# Patient Record
Sex: Male | Born: 1945 | Race: White | Hispanic: No | Marital: Married | Smoking: Former smoker
Health system: Southern US, Community
[De-identification: ages and names within clinical notes are randomized; demographics above are authoritative.]

## PROBLEM LIST (undated history)

## (undated) DIAGNOSIS — E119 Type 2 diabetes mellitus without complications: Secondary | ICD-10-CM

## (undated) DIAGNOSIS — E7800 Pure hypercholesterolemia, unspecified: Secondary | ICD-10-CM

## (undated) DIAGNOSIS — E785 Hyperlipidemia, unspecified: Secondary | ICD-10-CM

## (undated) DIAGNOSIS — I10 Essential (primary) hypertension: Secondary | ICD-10-CM

## (undated) DIAGNOSIS — H9190 Unspecified hearing loss, unspecified ear: Secondary | ICD-10-CM

## (undated) HISTORY — DX: Hyperlipidemia, unspecified: E78.5

## (undated) HISTORY — DX: Type 2 diabetes mellitus without complications: E11.9

## (undated) HISTORY — PX: CATARACT EXTRACTION: SUR2

## (undated) HISTORY — PX: FOOT SURGERY: SHX648

---

## 2013-01-16 ENCOUNTER — Other Ambulatory Visit: Payer: Self-pay | Admitting: Physician Assistant

## 2013-01-17 NOTE — Telephone Encounter (Signed)
CHART SHOWS PT NOT TAKING BUT PATIENT HAD FILLED LAST MONTH. LAST OV 1/14. LAST AIC 12/13 11.7

## 2013-03-14 ENCOUNTER — Ambulatory Visit (INDEPENDENT_AMBULATORY_CARE_PROVIDER_SITE_OTHER): Payer: Medicare HMO | Admitting: Family Medicine

## 2013-03-14 ENCOUNTER — Encounter: Payer: Self-pay | Admitting: Family Medicine

## 2013-03-14 VITALS — BP 156/87 | HR 68 | Temp 97.3°F | Ht 67.5 in | Wt 178.6 lb

## 2013-03-14 DIAGNOSIS — I10 Essential (primary) hypertension: Secondary | ICD-10-CM

## 2013-03-14 DIAGNOSIS — K4090 Unilateral inguinal hernia, without obstruction or gangrene, not specified as recurrent: Secondary | ICD-10-CM

## 2013-03-14 DIAGNOSIS — Z0000 Encounter for general adult medical examination without abnormal findings: Secondary | ICD-10-CM

## 2013-03-14 DIAGNOSIS — E785 Hyperlipidemia, unspecified: Secondary | ICD-10-CM

## 2013-03-14 DIAGNOSIS — E119 Type 2 diabetes mellitus without complications: Secondary | ICD-10-CM

## 2013-03-14 DIAGNOSIS — E559 Vitamin D deficiency, unspecified: Secondary | ICD-10-CM

## 2013-03-14 LAB — COMPLETE METABOLIC PANEL WITH GFR
ALT: 26 U/L (ref 0–53)
AST: 19 U/L (ref 0–37)
Albumin: 4.5 g/dL (ref 3.5–5.2)
Alkaline Phosphatase: 54 U/L (ref 39–117)
BUN: 21 mg/dL (ref 6–23)
CO2: 27 mEq/L (ref 19–32)
Calcium: 10.3 mg/dL (ref 8.4–10.5)
Chloride: 101 mEq/L (ref 96–112)
Creat: 0.87 mg/dL (ref 0.50–1.35)
GFR, Est African American: >89 mL/min
GFR, Est Non African American: 89 mL/min
Glucose, Bld: 129 mg/dL — ABNORMAL HIGH (ref 70–99)
Potassium: 4.6 mEq/L (ref 3.5–5.3)
Sodium: 139 mEq/L (ref 135–145)
Total Bilirubin: 0.5 mg/dL (ref 0.3–1.2)
Total Protein: 7 g/dL (ref 6.0–8.3)

## 2013-03-14 LAB — POCT CBC
Granulocyte percent: 67.2 %G (ref 37–80)
HCT, POC: 43.5 % (ref 43.5–53.7)
Hemoglobin: 15.5 g/dL (ref 14.1–18.1)
Lymph, poc: 1.9 (ref 0.6–3.4)
MCH, POC: 30.2 pg (ref 27–31.2)
MCHC: 35.6 g/dL — AB (ref 31.8–35.4)
MCV: 84.9 fL (ref 80–97)
MPV: 9.9 fL (ref 0–99.8)
POC Granulocyte: 4.5 (ref 2–6.9)
POC LYMPH PERCENT: 28 %L (ref 10–50)
Platelet Count, POC: 179 10*3/uL (ref 142–424)
RBC: 5.1 M/uL (ref 4.69–6.13)
RDW, POC: 13.3 %
WBC: 6.7 10*3/uL (ref 4.6–10.2)

## 2013-03-14 LAB — POCT GLYCOSYLATED HEMOGLOBIN (HGB A1C): Hemoglobin A1C: 8.2

## 2013-03-14 LAB — PSA: PSA: 2.04 ng/mL (ref <=4.00)

## 2013-03-14 MED ORDER — PRAVASTATIN SODIUM 40 MG PO TABS: 40.0000 mg | ORAL_TABLET | Freq: Every day | ORAL | Status: DC

## 2013-03-14 MED ORDER — LISINOPRIL 10 MG PO TABS: 10.0000 mg | ORAL_TABLET | Freq: Every day | ORAL | Status: DC

## 2013-03-14 MED ORDER — METFORMIN HCL 1000 MG PO TABS: 1000.0000 mg | ORAL_TABLET | Freq: Two times a day (BID) | ORAL | Status: DC

## 2013-03-14 MED ORDER — GLIMEPIRIDE 4 MG PO TABS: 4.0000 mg | ORAL_TABLET | Freq: Every day | ORAL | Status: DC
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
## 2013-03-14 NOTE — Patient Instructions (Signed)

## 2013-03-15 ENCOUNTER — Other Ambulatory Visit: Payer: Self-pay | Admitting: Family Medicine

## 2013-03-15 LAB — NMR LIPOPROFILE WITH LIPIDS
Cholesterol, Total: 198 mg/dL (ref <200)
HDL Particle Number: 36 umol/L (ref >=30.5)
HDL Size: 8.6 nm — ABNORMAL LOW (ref >=9.2)
HDL-C: 45 mg/dL (ref >=40)
LDL (calc): 130 mg/dL — ABNORMAL HIGH (ref <100)
LDL Particle Number: 1812 nmol/L — ABNORMAL HIGH (ref <1000)
LDL Size: 20.3 nm — ABNORMAL LOW (ref >20.5)
LP-IR Score: 64 — ABNORMAL HIGH (ref <=45)
Large HDL-P: 3.9 umol/L — ABNORMAL LOW (ref >=4.8)
Large VLDL-P: 1.6 nmol/L (ref <=2.7)
Small LDL Particle Number: 1063 nmol/L — ABNORMAL HIGH (ref <=527)
Triglycerides: 113 mg/dL (ref <150)
VLDL Size: 46.2 nm (ref <=46.6)

## 2013-03-15 LAB — VITAMIN D 25 HYDROXY (VIT D DEFICIENCY, FRACTURES): Vit D, 25-Hydroxy: 50 ng/mL (ref 30–89)

## 2013-03-15 MED ORDER — PRAVASTATIN SODIUM 80 MG PO TABS: 80.0000 mg | ORAL_TABLET | Freq: Every day | ORAL | Status: DC

## 2013-06-15 ENCOUNTER — Encounter: Payer: Self-pay | Admitting: Family Medicine

## 2013-06-15 ENCOUNTER — Ambulatory Visit (INDEPENDENT_AMBULATORY_CARE_PROVIDER_SITE_OTHER): Payer: Medicare HMO | Admitting: Family Medicine

## 2013-06-15 VITALS — BP 127/69 | HR 69 | Temp 97.8°F | Ht 67.0 in | Wt 179.6 lb

## 2013-06-15 DIAGNOSIS — E119 Type 2 diabetes mellitus without complications: Secondary | ICD-10-CM

## 2013-06-15 DIAGNOSIS — E785 Hyperlipidemia, unspecified: Secondary | ICD-10-CM

## 2013-06-15 LAB — POCT CBC
Granulocyte percent: 64.6 %G (ref 37–80)
HCT, POC: 45.4 % (ref 43.5–53.7)
Hemoglobin: 15.2 g/dL (ref 14.1–18.1)
Lymph, poc: 2 (ref 0.6–3.4)
MCH, POC: 28.6 pg (ref 27–31.2)
MCHC: 33.5 g/dL (ref 31.8–35.4)
MCV: 85.4 fL (ref 80–97)
MPV: 8.5 fL (ref 0–99.8)
POC Granulocyte: 4.2 (ref 2–6.9)
POC LYMPH PERCENT: 30.8 %L (ref 10–50)
Platelet Count, POC: 196 10*3/uL (ref 142–424)
RBC: 5.3 M/uL (ref 4.69–6.13)
RDW, POC: 12.9 %
WBC: 6.5 10*3/uL (ref 4.6–10.2)

## 2013-06-15 LAB — POCT GLYCOSYLATED HEMOGLOBIN (HGB A1C): Hemoglobin A1C: 7.7

## 2013-06-15 MED ORDER — GLIMEPIRIDE 4 MG PO TABS: 4.0000 mg | ORAL_TABLET | Freq: Every day | ORAL | Status: DC

## 2013-06-15 MED ORDER — METFORMIN HCL 1000 MG PO TABS: 1000.0000 mg | ORAL_TABLET | Freq: Two times a day (BID) | ORAL | Status: DC

## 2013-06-15 MED ORDER — PRAVASTATIN SODIUM 80 MG PO TABS: 80.0000 mg | ORAL_TABLET | Freq: Every day | ORAL | Status: DC

## 2013-06-15 NOTE — Patient Instructions (Signed)

## 2013-06-15 NOTE — Progress Notes (Signed)
  Subjective:    Patient ID: George [name], male    DOB: 1946/04/13, 67 y.o.   MRN: [id number]  HPI  This 67 y.o. male presents for evaluation of follow up appointment.   He has hx Of DM, hyperlipidemia, and hypertension.  He has not had colonoscopy.  He has not had shingles Vaccination and is due for Tdap.  He has no acute problems and is due for annual labs.  Review of Systems No chest pain, SOB, HA, dizziness, vision change, N/V, diarrhea, constipation, dysuria, urinary urgency or frequency, myalgias, arthralgias or rash.     Objective:   Physical Exam Vital signs noted  Well developed well nourished male.  HEENT - Head atraumatic Normocephalic                Eyes - PERRLA, Conjuctiva - clear Sclera- Clear EOMI                Ears - EAC's Wnl TM's Wnl Gross Hearing WNL                Nose - Nares patent                 Throat - oropharanx wnl Respiratory - Lungs CTA bilateral Cardiac - RRR S1 and S2 without murmur GI - Abdomen soft Nontender and bowel sounds active x 4 Extremities - No edema. Neuro - Grossly intact.       Assessment & Plan:  Diabetes - Plan: metFORMIN (GLUCOPHAGE) 1000 MG tablet, glimepiride (AMARYL) 4 MG tablet, Ambulatory referral to Gastroenterology, POCT CBC, POCT glycosylated hemoglobin (Hb A1C), CMP14+EGFR  Other and unspecified hyperlipidemia - Plan: pravastatin (PRAVACHOL) 80 MG tablet, Ambulatory referral to Gastroenterology, POCT CBC, Lipid panel, CMP14+EGFR

## 2013-06-16 LAB — LIPID PANEL
Chol/HDL Ratio: 3.8 ratio units (ref 0.0–5.0)
Cholesterol, Total: 184 mg/dL (ref 100–199)
HDL: 49 mg/dL (ref >39)
LDL Calculated: 116 mg/dL — ABNORMAL HIGH (ref 0–99)
Triglycerides: 93 mg/dL (ref 0–149)
VLDL Cholesterol Cal: 19 mg/dL (ref 5–40)

## 2013-06-16 LAB — CMP14+EGFR
ALT: 25 IU/L (ref 0–44)
AST: 18 IU/L (ref 0–40)
Albumin/Globulin Ratio: 2.5 (ref 1.1–2.5)
Albumin: 4.5 g/dL (ref 3.6–4.8)
Alkaline Phosphatase: 52 IU/L (ref 39–117)
BUN/Creatinine Ratio: 24 — ABNORMAL HIGH (ref 10–22)
BUN: 19 mg/dL (ref 8–27)
CO2: 26 mmol/L (ref 18–29)
Calcium: 9.7 mg/dL (ref 8.6–10.2)
Chloride: 95 mmol/L — ABNORMAL LOW (ref 97–108)
Creatinine, Ser: 0.8 mg/dL (ref 0.76–1.27)
GFR calc Af Amer: 106 mL/min/{1.73_m2} (ref >59)
GFR calc non Af Amer: 92 mL/min/{1.73_m2} (ref >59)
Globulin, Total: 1.8 g/dL (ref 1.5–4.5)
Glucose: 137 mg/dL — ABNORMAL HIGH (ref 65–99)
Potassium: 4.5 mmol/L (ref 3.5–5.2)
Sodium: 136 mmol/L (ref 134–144)
Total Bilirubin: 0.2 mg/dL (ref 0.0–1.2)
Total Protein: 6.3 g/dL (ref 6.0–8.5)

## 2013-06-25 ENCOUNTER — Encounter: Payer: Self-pay | Admitting: Internal Medicine

## 2013-07-02 ENCOUNTER — Encounter (HOSPITAL_COMMUNITY): Payer: Self-pay | Admitting: Pharmacy Technician

## 2013-07-05 NOTE — Patient Instructions (Addendum)
Your procedure is scheduled on:07/13/2013   Report to [facility] at   6:15  AM.  Call this number if you have problems the morning of surgery: [phone]   Remember:   Do not drink or eat food:After Midnight.  :  Take these medicines the morning of surgery with A SIP OF WATER: lisinopril   Do not wear jewelry, make-up or nail polish.  Do not wear lotions, powders, or perfumes. You may wear deodorant.  Do not shave 48 hours prior to surgery. Men may shave face and neck.  Do not bring valuables to the hospital.  Contacts, dentures or bridgework may not be worn into surgery.  Leave suitcase in the car. After surgery it may be brought to your room.  For patients admitted to the hospital, checkout time is 11:00 AM the day of discharge.   Patients discharged the day of surgery will not be allowed to drive home.    Special Instructions: Shower using CHG 2 nights before surgery and the night before surgery.  If you shower the day of surgery use CHG.  Use special wash - you have one bottle of CHG for all showers.  You should use approximately 1/3 of the bottle for each shower.   Please read over the following fact sheets that you were given: Pain Booklet, MRSA Information, Surgical Site Infection Prevention and Care and Recovery After Surgery  Hernia Repair Care After These instructions give you information on caring for yourself after your procedure. Your doctor may also give you more specific instructions. Call your doctor if you have any problems or questions after your procedure. HOME CARE   You may have changes in your poops (bowel movements).  You may have loose or watery poop (diarrhea).  You may be not able to poop.  Your bowels will slowly get back to normal.  Do not eat any food that makes you sick to your stomach (nauseous). Eat small meals 4 to 6 times a day instead of 3 large ones.  Do not drink pop. It will give you gas.  Do not drink alcohol.  Do not lift anything  heavier than 10 pounds. This is about the weight of a gallon of milk.  Do not do anything that makes you very tired for at least 6 weeks.  Do not get your wound wet for 2 days.  You may take a sponge bath during this time.  After 2 days you may take a shower. Gently pat your surgical cut (incision) dry with a towel. Do not rub it.  For men: You may have been given an athletic supporter (scrotal support) before you left the hospital. It holds your scrotum and testicles closer to your body so there is no strain on your wound. Wear the supporter until your doctor tells you that you do not need it anymore. GET HELP RIGHT AWAY IF:  You have watery poop, or cannot poop for more than 3 days.  You feel sick to your stomach or throw up (vomit) more than 2 or 3 times.  You have temperature by mouth above 102 F (38.9 C).  You see redness or puffiness (swelling) around your wound.  You see yellowish white fluid (pus) coming from your wound.  You see a bulge or bump in your lower belly (abdomen) or near your groin.  You develop a rash, trouble breathing, or any other symptoms from medicines taken. MAKE SURE YOU:  Understand these instructions.  Will watch your condition.  Will get help right away if your are not doing well or get worse. Document Released: 09/02/2008 Document Revised: 12/13/2011 Document Reviewed: 09/02/2008 [facility] Patient Information 2014 [place]. PATIENT INSTRUCTIONS POST-ANESTHESIA  IMMEDIATELY FOLLOWING SURGERY:  Do not drive or operate machinery for the first twenty four hours after surgery.  Do not make any important decisions for twenty four hours after surgery or while taking narcotic pain medications or sedatives.  If you develop intractable nausea and vomiting or a severe headache please notify your doctor immediately.  FOLLOW-UP:  Please make an appointment with your surgeon as instructed. You do not need to follow up with anesthesia unless  specifically instructed to do so.  WOUND CARE INSTRUCTIONS (if applicable):  Keep a dry clean dressing on the anesthesia/puncture wound site if there is drainage.  Once the wound has quit draining you may leave it open to air.  Generally you should leave the bandage intact for twenty four hours unless there is drainage.  If the epidural site drains for more than 36-48 hours please call the anesthesia department.  QUESTIONS?:  Please feel free to call your physician or the hospital operator if you have any questions, and they will be happy to assist you.

## 2013-07-06 ENCOUNTER — Encounter (HOSPITAL_COMMUNITY): Payer: Self-pay

## 2013-07-06 ENCOUNTER — Encounter (HOSPITAL_COMMUNITY)
Admission: RE | Admit: 2013-07-06 | Discharge: 2013-07-06 | Disposition: A | Discharge status: Home / Self Care | Payer: Medicare HMO | Source: Ambulatory Visit | Attending: General Surgery | Admitting: General Surgery

## 2013-07-06 DIAGNOSIS — Z01818 Encounter for other preprocedural examination: Secondary | ICD-10-CM | POA: Insufficient documentation

## 2013-07-06 DIAGNOSIS — Z01812 Encounter for preprocedural laboratory examination: Secondary | ICD-10-CM | POA: Insufficient documentation

## 2013-07-06 DIAGNOSIS — Z01810 Encounter for preprocedural cardiovascular examination: Secondary | ICD-10-CM | POA: Insufficient documentation

## 2013-07-06 HISTORY — DX: Essential (primary) hypertension: I10

## 2013-07-06 HISTORY — DX: Pure hypercholesterolemia, unspecified: E78.00

## 2013-07-06 HISTORY — DX: Unspecified hearing loss, unspecified ear: H91.90

## 2013-07-06 LAB — CBC WITH DIFFERENTIAL/PLATELET
Basophils Relative: 0 % (ref 0–1)
HCT: 42.1 % (ref 39.0–52.0)
Hemoglobin: 14.4 g/dL (ref 13.0–17.0)
Lymphocytes Relative: 31 % (ref 12–46)
Lymphs Abs: 2.2 10*3/uL (ref 0.7–4.0)
Monocytes Absolute: 0.3 10*3/uL (ref 0.1–1.0)
Monocytes Relative: 5 % (ref 3–12)
Neutro Abs: 4.5 10*3/uL (ref 1.7–7.7)
Neutrophils Relative %: 63 % (ref 43–77)
RBC: 4.93 MIL/uL (ref 4.22–5.81)
WBC: 7.2 10*3/uL (ref 4.0–10.5)

## 2013-07-06 LAB — BASIC METABOLIC PANEL
BUN: 19 mg/dL (ref 6–23)
CO2: 26 mEq/L (ref 19–32)
Calcium: 9.9 mg/dL (ref 8.4–10.5)
Chloride: 95 mEq/L — ABNORMAL LOW (ref 96–112)
Creatinine, Ser: 0.8 mg/dL (ref 0.50–1.35)
GFR calc Af Amer: >90 mL/min (ref >90)
GFR calc non Af Amer: 90 mL/min — ABNORMAL LOW (ref >90)
Glucose, Bld: 227 mg/dL — ABNORMAL HIGH (ref 70–99)
Potassium: 4.6 mEq/L (ref 3.5–5.1)
Sodium: 135 mEq/L (ref 135–145)

## 2013-07-06 NOTE — Progress Notes (Signed)
07/06/13 0933  OBSTRUCTIVE SLEEP APNEA  Have you ever been diagnosed with sleep apnea through a sleep study? No  Do you snore loudly (loud enough to be heard through closed doors)?  1  Do you often feel tired, fatigued, or sleepy during the daytime? 0  Has anyone observed you stop breathing during your sleep? 0  Do you have, or are you being treated for high blood pressure? 1  BMI more than 35 kg/m2? 0  Age over 68 years old? 1  Neck circumference greater than 40 cm/18 inches? 0  Gender: 1  Obstructive Sleep Apnea Score 4

## 2013-07-12 NOTE — OR Nursing (Signed)
Trenton Gammon called Dr. Leticia Penna for orders.

## 2013-07-13 ENCOUNTER — Ambulatory Visit (HOSPITAL_COMMUNITY)
Admission: RE | Admit: 2013-07-13 | Discharge: 2013-07-13 | Disposition: A | Discharge status: Home / Self Care | Payer: Medicare HMO | Source: Ambulatory Visit | Attending: General Surgery | Admitting: General Surgery

## 2013-07-13 ENCOUNTER — Encounter (HOSPITAL_COMMUNITY)
Admission: RE | Disposition: A | Discharge status: Home / Self Care | Payer: Self-pay | Source: Ambulatory Visit | Attending: General Surgery

## 2013-07-13 ENCOUNTER — Ambulatory Visit (HOSPITAL_COMMUNITY): Payer: Medicare HMO | Admitting: Anesthesiology

## 2013-07-13 ENCOUNTER — Encounter (HOSPITAL_COMMUNITY): Payer: Self-pay | Admitting: *Deleted

## 2013-07-13 ENCOUNTER — Encounter (HOSPITAL_COMMUNITY): Payer: Medicare HMO | Admitting: Anesthesiology

## 2013-07-13 DIAGNOSIS — K4090 Unilateral inguinal hernia, without obstruction or gangrene, not specified as recurrent: Secondary | ICD-10-CM | POA: Insufficient documentation

## 2013-07-13 DIAGNOSIS — E119 Type 2 diabetes mellitus without complications: Secondary | ICD-10-CM | POA: Insufficient documentation

## 2013-07-13 DIAGNOSIS — I10 Essential (primary) hypertension: Secondary | ICD-10-CM | POA: Insufficient documentation

## 2013-07-13 DIAGNOSIS — Z01812 Encounter for preprocedural laboratory examination: Secondary | ICD-10-CM | POA: Insufficient documentation

## 2013-07-13 HISTORY — PX: INGUINAL HERNIA REPAIR: SHX194

## 2013-07-13 LAB — GLUCOSE, CAPILLARY: Glucose-Capillary: 208 mg/dL — ABNORMAL HIGH (ref 70–99)

## 2013-07-13 SURGERY — REPAIR, HERNIA, INGUINAL, ADULT
Anesthesia: General | Site: Groin | Laterality: Left | Wound class: Clean

## 2013-07-13 MED ORDER — BUPIVACAINE HCL (PF) 0.5 % IJ SOLN
INTRAMUSCULAR | Status: AC
  Filled 2013-07-13: qty 30

## 2013-07-13 MED ORDER — FENTANYL CITRATE 0.05 MG/ML IJ SOLN
INTRAMUSCULAR | Status: AC
  Filled 2013-07-13: qty 5

## 2013-07-13 MED ORDER — CEFAZOLIN SODIUM-DEXTROSE 2-3 GM-% IV SOLR
INTRAVENOUS | Status: AC
  Filled 2013-07-13: qty 50

## 2013-07-13 MED ORDER — FENTANYL CITRATE 0.05 MG/ML IJ SOLN
INTRAMUSCULAR | Status: DC | PRN
  Administered 2013-07-13 (×2): 50 ug via INTRAVENOUS
  Administered 2013-07-13: 150 ug via INTRAVENOUS

## 2013-07-13 MED ORDER — LACTATED RINGERS IV SOLN
INTRAVENOUS | Status: DC
  Administered 2013-07-13 (×2): via INTRAVENOUS

## 2013-07-13 MED ORDER — LIDOCAINE HCL (PF) 1 % IJ SOLN
INTRAMUSCULAR | Status: AC
  Filled 2013-07-13: qty 5

## 2013-07-13 MED ORDER — FENTANYL CITRATE 0.05 MG/ML IJ SOLN
INTRAMUSCULAR | Status: AC
  Filled 2013-07-13: qty 2

## 2013-07-13 MED ORDER — ONDANSETRON HCL 4 MG/2ML IJ SOLN: 4.0000 mg | Freq: Once | INTRAMUSCULAR | Status: DC | PRN

## 2013-07-13 MED ORDER — BUPIVACAINE HCL (PF) 0.5 % IJ SOLN
INTRAMUSCULAR | Status: DC | PRN
  Administered 2013-07-13: 10 mL

## 2013-07-13 MED ORDER — MIDAZOLAM HCL 2 MG/2ML IJ SOLN
1.0000 mg | INTRAMUSCULAR | Status: DC | PRN
  Administered 2013-07-13: 2 mg via INTRAVENOUS

## 2013-07-13 MED ORDER — PROPOFOL 10 MG/ML IV BOLUS
INTRAVENOUS | Status: AC
  Filled 2013-07-13: qty 20

## 2013-07-13 MED ORDER — EPHEDRINE SULFATE 50 MG/ML IJ SOLN
INTRAMUSCULAR | Status: AC
  Filled 2013-07-13: qty 1

## 2013-07-13 MED ORDER — FENTANYL CITRATE 0.05 MG/ML IJ SOLN
25.0000 ug | INTRAMUSCULAR | Status: DC | PRN
  Administered 2013-07-13 (×4): 50 ug via INTRAVENOUS

## 2013-07-13 MED ORDER — SODIUM CHLORIDE 0.9 % IR SOLN
Status: DC | PRN
  Administered 2013-07-13: 1000 mL

## 2013-07-13 MED ORDER — CEFAZOLIN SODIUM-DEXTROSE 2-3 GM-% IV SOLR
2.0000 g | INTRAVENOUS | Status: AC
  Administered 2013-07-13: 2 g via INTRAVENOUS

## 2013-07-13 MED ORDER — EPHEDRINE SULFATE 50 MG/ML IJ SOLN
INTRAMUSCULAR | Status: DC | PRN
  Administered 2013-07-13 (×2): 10 mg via INTRAVENOUS

## 2013-07-13 MED ORDER — LIDOCAINE HCL 1 % IJ SOLN
INTRAMUSCULAR | Status: DC | PRN
  Administered 2013-07-13: 40 mg via INTRADERMAL

## 2013-07-13 MED ORDER — FENTANYL CITRATE 0.05 MG/ML IJ SOLN
25.0000 ug | INTRAMUSCULAR | Status: AC
  Administered 2013-07-13 (×2): 25 ug via INTRAVENOUS

## 2013-07-13 MED ORDER — MIDAZOLAM HCL 2 MG/2ML IJ SOLN
INTRAMUSCULAR | Status: AC
  Filled 2013-07-13: qty 2

## 2013-07-13 MED ORDER — CELECOXIB 100 MG PO CAPS
400.0000 mg | ORAL_CAPSULE | Freq: Every day | ORAL | Status: AC
  Administered 2013-07-13: 400 mg via ORAL
  Filled 2013-07-13: qty 4

## 2013-07-13 MED ORDER — PROPOFOL 10 MG/ML IV BOLUS
INTRAVENOUS | Status: DC | PRN
  Administered 2013-07-13 (×2): 100 mg via INTRAVENOUS
  Administered 2013-07-13: 180 mg via INTRAVENOUS

## 2013-07-13 MED ORDER — HYDROCODONE-ACETAMINOPHEN 5-325 MG PO TABS: 1.0000 | ORAL_TABLET | ORAL | Status: DC | PRN

## 2013-07-13 SURGICAL SUPPLY — 42 items
APL SKNCLS STERI-STRIP NONHPOA (GAUZE/BANDAGES/DRESSINGS) ×1
BAG HAMPER (MISCELLANEOUS) ×2 IMPLANT
BENZOIN TINCTURE PRP APPL 2/3 (GAUZE/BANDAGES/DRESSINGS) ×2 IMPLANT
CLOTH BEACON ORANGE TIMEOUT ST (SAFETY) ×2 IMPLANT
COVER LIGHT HANDLE STERIS (MISCELLANEOUS) ×4 IMPLANT
DECANTER SPIKE VIAL GLASS SM (MISCELLANEOUS) ×2 IMPLANT
DRAIN PENROSE 18X.75 LTX STRL (MISCELLANEOUS) ×2 IMPLANT
DURAPREP 26ML APPLICATOR (WOUND CARE) ×2 IMPLANT
ELECT REM PT RETURN 9FT ADLT (ELECTROSURGICAL) ×2
ELECTRODE REM PT RTRN 9FT ADLT (ELECTROSURGICAL) ×1 IMPLANT
FORMALIN 10 PREFIL 120ML (MISCELLANEOUS) ×2 IMPLANT
GLOVE BIO SURGEON STRL SZ7 (GLOVE) ×1 IMPLANT
GLOVE BIOGEL PI IND STRL 7.0 (GLOVE) IMPLANT
GLOVE BIOGEL PI IND STRL 7.5 (GLOVE) ×1 IMPLANT
GLOVE BIOGEL PI INDICATOR 7.0 (GLOVE) ×1
GLOVE BIOGEL PI INDICATOR 7.5 (GLOVE) ×1
GLOVE ECLIPSE 6.5 STRL STRAW (GLOVE) ×1 IMPLANT
GLOVE ECLIPSE 7.0 STRL STRAW (GLOVE) ×2 IMPLANT
GLOVE SS BIOGEL STRL SZ 6.5 (GLOVE) IMPLANT
GLOVE SUPERSENSE BIOGEL SZ 6.5 (GLOVE) ×1
GOWN STRL REIN XL XLG (GOWN DISPOSABLE) ×6 IMPLANT
INST SET MINOR GENERAL (KITS) ×2 IMPLANT
KIT ROOM TURNOVER APOR (KITS) ×2 IMPLANT
MANIFOLD NEPTUNE II (INSTRUMENTS) ×2 IMPLANT
MESH HERNIA 1.6X1.9 PLUG LRG (Mesh General) IMPLANT
MESH HERNIA PLUG LRG (Mesh General) ×1 IMPLANT
NS IRRIG 1000ML POUR BTL (IV SOLUTION) ×2 IMPLANT
PACK MINOR (CUSTOM PROCEDURE TRAY) ×2 IMPLANT
PAD ARMBOARD 7.5X6 YLW CONV (MISCELLANEOUS) ×2 IMPLANT
SET BASIN LINEN APH (SET/KITS/TRAYS/PACK) ×2 IMPLANT
STRIP CLOSURE SKIN 1/2X4 (GAUZE/BANDAGES/DRESSINGS) ×2 IMPLANT
SUT ETHIBOND NAB MO 7 #0 18IN (SUTURE) ×3 IMPLANT
SUT MNCRL AB 4-0 PS2 18 (SUTURE) ×2 IMPLANT
SUT SILK 2 0 (SUTURE)
SUT SILK 2-0 18XBRD TIE 12 (SUTURE) IMPLANT
SUT VIC AB 2-0 CT1 27 (SUTURE) ×2
SUT VIC AB 2-0 CT1 TAPERPNT 27 (SUTURE) ×1 IMPLANT
SUT VIC AB 3-0 SH 27 (SUTURE) ×2
SUT VIC AB 3-0 SH 27X BRD (SUTURE) ×1 IMPLANT
SUT VICRYL AB 3 0 TIES (SUTURE) IMPLANT
SYR BULB IRRIGATION 50ML (SYRINGE) ×2 IMPLANT
SYR CONTROL 10ML LL (SYRINGE) ×2 IMPLANT

## 2013-07-13 NOTE — Interval H&P Note (Signed)
History and Physical Interval Note:  07/13/2013 7:54 AM  George [name]  has presented today for surgery, with the diagnosis of left inguinal hernia  The various methods of treatment have been discussed with the patient and family. After consideration of risks, benefits and other options for treatment, the patient has consented to  Procedure(s): HERNIA REPAIR INGUINAL ADULT (Left) as a surgical intervention .  The patient's history has been reviewed, patient examined, no change in status, stable for surgery.  I have reviewed the patient's chart and labs.  Questions were answered to the patient's satisfaction.     Bradely Rudin C

## 2013-07-13 NOTE — Anesthesia Preprocedure Evaluation (Signed)
Anesthesia Evaluation  Patient identified by MRN, date of birth, ID band Patient awake    Reviewed: Allergy & Precautions, H&P , NPO status , Patient's Chart, lab work & pertinent test results  Airway Mallampati: II TM Distance: >3 FB Neck ROM: Full    Dental  (+) Poor Dentition, Missing, Loose, Chipped and Dental Advisory Given   Pulmonary former smoker,  breath sounds clear to auscultation        Cardiovascular hypertension, Rhythm:Regular Rate:Normal     Neuro/Psych    GI/Hepatic   Endo/Other  diabetes, Poorly Controlled, Type 2, Oral Hypoglycemic Agents  Renal/GU      Musculoskeletal   Abdominal   Peds  Hematology   Anesthesia Other Findings   Reproductive/Obstetrics                           Anesthesia Physical Anesthesia Plan  ASA: III  Anesthesia Plan: General   Post-op Pain Management:    Induction: Intravenous  Airway Management Planned: LMA  Additional Equipment:   Intra-op Plan:   Post-operative Plan: Extubation in OR  Informed Consent: I have reviewed the patients History and Physical, chart, labs and discussed the procedure including the risks, benefits and alternatives for the proposed anesthesia with the patient or authorized representative who has indicated his/her understanding and acceptance.     Plan Discussed with:   Anesthesia Plan Comments:         Anesthesia Quick Evaluation

## 2013-07-13 NOTE — Anesthesia Postprocedure Evaluation (Signed)
  Anesthesia Post-op Note  Patient: George [name]  Procedure(s) Performed: Procedure(s): HERNIA REPAIR INGUINAL ADULT (Left)  Patient Location: PACU  Anesthesia Type:General  Level of Consciousness: awake and alert   Airway and Oxygen Therapy: Patient Spontanous Breathing and Patient connected to face mask oxygen  Post-op Pain: none  Post-op Assessment: Post-op Vital signs reviewed, Patient's Cardiovascular Status Stable, Respiratory Function Stable, Patent Airway and No signs of Nausea or vomiting  Post-op Vital Signs: Reviewed and stable  Complications: No apparent anesthesia complications

## 2013-07-13 NOTE — Progress Notes (Signed)
Paged Dr. Leticia Penna @[phone]  for consent orders.

## 2013-07-13 NOTE — H&P (Signed)
  NTS SOAP Note  Vital Signs:  Vitals as of: 04/10/2013: Systolic 132: Diastolic 77: Heart Rate 70: Temp 97.6F: Height 78ft 7in: Weight 179Lbs 0 Ounces: BMI 28.04  BMI : 28.04 kg/m2  Subjective: This 39 Years 96 Months old Male presents for of bulge in left groin.  Slow increase in size.  Occ discomfort.  No ssx of incarceration of strangulation.    Review of Symptoms:  Constitutional:unremarkable   Head:unremarkable    Eyes:unremarkable   Nose/Mouth/Throat:unremarkable Cardiovascular:  unremarkable   Respiratory:unremarkable   Gastrointestinal:  unremarkable  as per HPI Genitourinary:unremarkable     Musculoskeletal:unremarkable   Skin:unremarkable Breast:unremarkable   Hematolgic/Lymphatic:unremarkable     Allergic/Immunologic:unremarkable     Past Medical History:  Obtained     Past Medical History  Surgical History: Foot Medical Problems: DM, Hyperchole, HTN Allergies: NKDA Medications: Metformin, glimerpiride, pravachol, lisinopril, ASA   Social History:Obtained  Social History  Preferred Language: English Race:  White Ethnicity: Not Hispanic / Latino Age: 67 Years 3 Months Marital Status:  M Alcohol: none Recreational drug(s): none   Smoking Status: Never smoker reviewed on 04/10/2013 Functional Status reviewed on mm/dd/yyyy ------------------------------------------------ Bathing: Normal Cooking: Normal Dressing: Normal Driving: Normal Eating: Normal Managing Meds: Normal Oral Care: Normal Shopping: Normal Toileting: Normal Transferring: Normal Walking: Normal Cognitive Status reviewed on mm/dd/yyyy ------------------------------------------------ Attention: Normal Decision Making: Normal Language: Normal Memory: Normal Motor: Normal Perception: Normal Problem Solving: Normal Visual and Spatial: Normal   Family History:Obtained    Family Health History Mother  Father  Other Family  Member, Living; Healthy; noncontributory    Objective Information: General:  Well appearing, well nourished in no distress. Skin:     no rash or prominent lesions Head:Atraumatic; no masses; no abnormalities Eyes:  conjunctiva clear, EOM intact, PERRL Mouth:  Mucous membranes moist, no mucosal lesions. Neck:  Supple without lymphadenopathy.  Heart:  RRR, no murmur Lungs:    CTA bilaterally, no wheezes, rhonchi, rales.  Breathing unlabored. Abdomen:Soft, NT/ND, no HSM, no masses..  Small umbilicla hernia.  Large LIH.  Nontender.    Assessment:    Plan: LIH.  Options discussed with patient.  SSX of incarceration/strangulation discussed.  Will schedule at his convenience.    Patient Education:Alternative treatments to surgery were discussed with patient (and family).  Risks and benefits  of procedure were fully explained to the patient (and family) who gave informed consent. Patient/family questions were addressed.  Follow-up:Pending Surgery,PRN

## 2013-07-13 NOTE — Op Note (Signed)
Patient:  George [name]  DOB:  1946-09-28  MRN:  [id number]    Preop Diagnosis:  Left inguinal hernia  Postop Diagnosis:  Same, indirect and direct components  Procedure:  Left anal hernia repair with mesh  Surgeon:  Dr. Tilford Pillar.   Anes:  General via laryngeal mask airway, 0.5% Sensorcaine plain for local  Indications:  Patient is a 67 year old male presented to my office with a very large left inguinal hernia. This was chronically incarcerated. There was suspected bowel present. He has no symptoms of strangulation. Risks benefits and alternatives of a hernia repair were discussed. Risk including but not limited to risk of bleeding, infection, infection and mesh, ischemic orchiditis, paresthesias, testicular loss, intraoperative cardiac component ventral discussed with patient.  Procedure note:  Patient was taken to the operator is placed in supine position the or table time the general anesthetic is administered. Once patient was asleep a larger mask airway was placed. At this point his abdomen and left groin were prepped with DuraPrep solution and draped in standard fashion. Time out was performed. A skin incision was created with a 10 blade scalpel with additional dissection down to subcuticular tissue carried out using electrocautery and including the division of Scarpa's fascia. By encountering the external oblique fascia this was scored with a 15 blade scalpel and opened medially to the external inguinal ring with Metzenbaum scissors. At this point a large incarcerated hernia sac was identified. I attempted to create a window behind the cord structures and hernia sac over the pubic tubercle with digital dissection however due to the size of the hernia I do not feel this is possible. I opted at this 0.2 in her into the hernia sac sharply with Metzenbaum scissors. The sigmoid colon is identified. It is free from the hernia sac and then reduced back into the abdominal cavity after  inspecting the entire length noting it was clearly viable. Some omentum was also returned back into the abdominal cavity. A lap sponge was placed into the direct defect to reduce the abdominal contents. I then identified the cord structures. I created a window behind the course or fevers and an indirect component of the hernia. A Penrose drain was placed and utilized to elevate the cord structures up into the field. I carefully dissected the indirect component of the hernia off the cord structures. The indirect contents were reduced back into the abdominal cavity. The redundant sac was excised and disposed of. Hemostasis was good. Attention at this time was turned to placement of the mesh.  A mesh plug was inserted into the internal inguinal ring adjacent to the cord structures. This was secured to the wall the ring with 0 Ethibond sutures in simple or fashion. With these pexing sutures in place the mesh onlay was brought to the field. His pexed medially to the pubic tubercle, superiorly to the conjoined tendon, inferiorly to the shelving portion of the inguinal ligament. The keyhole defect was secured around the cord structures. The tails were placed under the external oblique fascia. At this point as quite pleased with the appearance of the repair. To note all sutures used in the repair were 0 Ethibond sutures. The surgical field was irrigated with sterile saline. Hemostasis was excellent. The cord structures were returned back into the canal and the Penrose drain was removed. The external oblique fascia was reapproximated using a 2-0 Vicryl in a running continuous fashion. Local anesthetic was instilled. A 3-0 Vicryl was then utilized reapproximate the Scarpa's fascial  layer. The skin edges were then reapproximated using a 4-0 Monocryl in a running subcuticular suture. Skin was washed dried moist dry towel. Benzoin is applied around incision. Half-inch are suture placed. The drapes removed the patient left come  general anesthetic. He was transferred to the PACU in stable condition. At the conclusion of procedure all instrument, sponge, needle counts are correct. Patient tolerated procedure extremely well.  Complications:  None apparent  EBL:  Less than 50 ML's  Specimen:  None

## 2013-07-13 NOTE — Preoperative (Signed)
Beta Blockers   Reason not to administer Beta Blockers:Not Applicable 

## 2013-07-13 NOTE — Transfer of Care (Signed)
Immediate Anesthesia Transfer of Care Note  Patient: George [name]  Procedure(s) Performed: Procedure(s): HERNIA REPAIR INGUINAL ADULT (Left)  Patient Location: PACU  Anesthesia Type:General  Level of Consciousness: awake  Airway & Oxygen Therapy: Patient Spontanous Breathing and Patient connected to face mask oxygen  Post-op Assessment: Report given to PACU RN  Post vital signs: Reviewed and stable  Complications: No apparent anesthesia complications

## 2013-07-17 ENCOUNTER — Encounter (HOSPITAL_COMMUNITY): Payer: Self-pay | Admitting: General Surgery

## 2013-08-20 ENCOUNTER — Ambulatory Visit (AMBULATORY_SURGERY_CENTER): Payer: Self-pay

## 2013-08-20 VITALS — Ht 67.0 in | Wt 184.0 lb

## 2013-08-20 DIAGNOSIS — Z1211 Encounter for screening for malignant neoplasm of colon: Secondary | ICD-10-CM

## 2013-08-20 MED ORDER — MOVIPREP 100 G PO SOLR: 1.0000 | Freq: Once | ORAL | Status: DC

## 2013-08-23 ENCOUNTER — Encounter: Payer: Self-pay | Admitting: Internal Medicine

## 2013-09-03 ENCOUNTER — Encounter: Payer: Self-pay | Admitting: Internal Medicine

## 2013-09-14 ENCOUNTER — Ambulatory Visit (INDEPENDENT_AMBULATORY_CARE_PROVIDER_SITE_OTHER): Payer: Medicare HMO | Admitting: Family Medicine

## 2013-09-14 ENCOUNTER — Encounter: Payer: Self-pay | Admitting: Family Medicine

## 2013-09-14 VITALS — BP 148/74 | HR 75 | Temp 96.9°F | Ht 67.0 in | Wt 184.0 lb

## 2013-09-14 DIAGNOSIS — E785 Hyperlipidemia, unspecified: Secondary | ICD-10-CM

## 2013-09-14 DIAGNOSIS — E119 Type 2 diabetes mellitus without complications: Secondary | ICD-10-CM

## 2013-09-14 DIAGNOSIS — I10 Essential (primary) hypertension: Secondary | ICD-10-CM

## 2013-09-14 DIAGNOSIS — R5383 Other fatigue: Secondary | ICD-10-CM

## 2013-09-14 DIAGNOSIS — K4090 Unilateral inguinal hernia, without obstruction or gangrene, not specified as recurrent: Secondary | ICD-10-CM

## 2013-09-14 DIAGNOSIS — R5381 Other malaise: Secondary | ICD-10-CM

## 2013-09-14 LAB — POCT CBC
Granulocyte percent: 65.9 %G (ref 37–80)
HCT, POC: 46.6 % (ref 43.5–53.7)
Hemoglobin: 15.1 g/dL (ref 14.1–18.1)
Lymph, poc: 1.8 (ref 0.6–3.4)
MCH, POC: 27.6 pg (ref 27–31.2)
MCHC: 32.5 g/dL (ref 31.8–35.4)
MCV: 84.9 fL (ref 80–97)
MPV: 9.8 fL (ref 0–99.8)
POC Granulocyte: 4.1 (ref 2–6.9)
POC LYMPH PERCENT: 29.3 %L (ref 10–50)
Platelet Count, POC: 168 10*3/uL (ref 142–424)
RBC: 5.5 M/uL (ref 4.69–6.13)
RDW, POC: 13.4 %
WBC: 6.2 10*3/uL (ref 4.6–10.2)

## 2013-09-14 LAB — POCT GLYCOSYLATED HEMOGLOBIN (HGB A1C): Hemoglobin A1C: 8.3

## 2013-09-14 MED ORDER — HYDROCODONE-ACETAMINOPHEN 5-325 MG PO TABS: 1.0000 | ORAL_TABLET | ORAL | Status: DC | PRN

## 2013-09-14 MED ORDER — METFORMIN HCL 1000 MG PO TABS: 1000.0000 mg | ORAL_TABLET | Freq: Two times a day (BID) | ORAL | Status: DC

## 2013-09-14 MED ORDER — LISINOPRIL 10 MG PO TABS: 10.0000 mg | ORAL_TABLET | Freq: Every day | ORAL | Status: DC

## 2013-09-14 MED ORDER — PRAVASTATIN SODIUM 80 MG PO TABS: 80.0000 mg | ORAL_TABLET | Freq: Every day | ORAL | Status: DC

## 2013-09-14 NOTE — Addendum Note (Signed)
Addended by: Prescott Gum on: 09/14/2013 11:19 AM   Modules accepted: Orders

## 2013-09-14 NOTE — Progress Notes (Signed)
   Subjective:    Patient ID: George [name], male    DOB: 08-01-1946, 67 y.o.   MRN: [id number]  HPI This 67 y.o. male presents for evaluation of CPE.  He has hx of inguinal hernia and this was Repaired and he still gets some discomfort even though the hernia is repaired.  He has hx of  T2DM.  He has hx of hypertension, hyperlipidemia, and diabetes.  Review of Systems    No chest pain, SOB, HA, dizziness, vision change, N/V, diarrhea, constipation, dysuria, urinary urgency or frequency, myalgias, arthralgias or rash.  Objective:   Physical Exam  Vital signs noted  Well developed well nourished male.  HEENT - Head atraumatic Normocephalic                Eyes - PERRLA, Conjuctiva - clear Sclera- Clear EOMI                Ears - EAC's Wnl TM's Wnl Gross Hearing WNL                Nose - Nares patent                 Throat - oropharanx wnl Respiratory - Lungs CTA bilateral Cardiac - RRR S1 and S2 without murmur GI - Abdomen soft Nontender and bowel sounds active x 4 Extremities - No edema. Neuro - Grossly intact.      Assessment & Plan:  Diabetes - Plan: POCT glycosylated hemoglobin (Hb A1C), POCT UA - Microalbumin, CMP14+EGFR, metFORMIN (GLUCOPHAGE) 1000 MG tablet  Fatigue - Plan: POCT CBC, Vit D  25 hydroxy (rtn osteoporosis monitoring), Thyroid Panel With TSH  Hyperlipemia - Plan: Lipid panel, pravastatin (PRAVACHOL) 80 MG tablet  Other and unspecified hyperlipidemia  Essential hypertension, benign - Plan: lisinopril (PRINIVIL,ZESTRIL) 10 MG tablet  Inguinal hernia - Plan: HYDROcodone-acetaminophen (NORCO) 5-325 MG per tablet  Deatra Canter FNP

## 2013-09-14 NOTE — Addendum Note (Signed)
Addended by: Bearl Mulberry on: 09/14/2013 11:36 AM   Modules accepted: Orders, Medications

## 2013-09-15 ENCOUNTER — Other Ambulatory Visit: Payer: Self-pay | Admitting: Family Medicine

## 2013-09-15 DIAGNOSIS — E119 Type 2 diabetes mellitus without complications: Secondary | ICD-10-CM

## 2013-09-15 LAB — THYROID PANEL WITH TSH
Free Thyroxine Index: 2.3 (ref 1.2–4.9)
T3 Uptake Ratio: 32 % (ref 24–39)
T4, Total: 7.1 ug/dL (ref 4.5–12.0)
TSH: 0.804 u[IU]/mL (ref 0.450–4.500)

## 2013-09-15 LAB — CMP14+EGFR
ALT: 27 IU/L (ref 0–44)
AST: 17 IU/L (ref 0–40)
Albumin/Globulin Ratio: 1.9 (ref 1.1–2.5)
Albumin: 4.6 g/dL (ref 3.6–4.8)
Alkaline Phosphatase: 60 IU/L (ref 39–117)
BUN/Creatinine Ratio: 21 (ref 10–22)
BUN: 16 mg/dL (ref 8–27)
CO2: 24 mmol/L (ref 18–29)
Calcium: 10.2 mg/dL (ref 8.6–10.2)
Chloride: 98 mmol/L (ref 97–108)
Creatinine, Ser: 0.77 mg/dL (ref 0.76–1.27)
GFR calc Af Amer: 108 mL/min/{1.73_m2} (ref >59)
GFR calc non Af Amer: 93 mL/min/{1.73_m2} (ref >59)
Globulin, Total: 2.4 g/dL (ref 1.5–4.5)
Glucose: 148 mg/dL — ABNORMAL HIGH (ref 65–99)
Potassium: 4.6 mmol/L (ref 3.5–5.2)
Sodium: 139 mmol/L (ref 134–144)
Total Bilirubin: 0.3 mg/dL (ref 0.0–1.2)
Total Protein: 7 g/dL (ref 6.0–8.5)

## 2013-09-15 LAB — LIPID PANEL
Chol/HDL Ratio: 4.1 ratio units (ref 0.0–5.0)
Cholesterol, Total: 192 mg/dL (ref 100–199)
HDL: 47 mg/dL (ref >39)
LDL Calculated: 127 mg/dL — ABNORMAL HIGH (ref 0–99)
Triglycerides: 89 mg/dL (ref 0–149)
VLDL Cholesterol Cal: 18 mg/dL (ref 5–40)

## 2013-09-15 LAB — VITAMIN D 25 HYDROXY (VIT D DEFICIENCY, FRACTURES): Vit D, 25-Hydroxy: 45.5 ng/mL (ref 30.0–100.0)

## 2013-09-15 MED ORDER — GLIMEPIRIDE 4 MG PO TABS: 8.0000 mg | ORAL_TABLET | Freq: Every day | ORAL | Status: DC

## 2013-10-11 ENCOUNTER — Other Ambulatory Visit: Payer: Self-pay | Admitting: *Deleted

## 2013-10-11 ENCOUNTER — Telehealth: Payer: Self-pay | Admitting: Family Medicine

## 2013-10-11 DIAGNOSIS — E119 Type 2 diabetes mellitus without complications: Secondary | ICD-10-CM

## 2013-10-11 MED ORDER — METFORMIN HCL 1000 MG PO TABS
1000.0000 mg | ORAL_TABLET | Freq: Two times a day (BID) | ORAL | Status: DC
Start: 2013-10-11 — End: 2014-06-17

## 2013-10-12 NOTE — Telephone Encounter (Signed)
PT MEDICINE WAS CALLED IN TO PHARMACY BY GINA R.Marland Kitchen..Marland Kitchen

## 2013-11-15 ENCOUNTER — Encounter: Payer: Self-pay | Admitting: *Deleted

## 2013-12-11 ENCOUNTER — Encounter: Payer: Self-pay | Admitting: Family Medicine

## 2013-12-11 ENCOUNTER — Ambulatory Visit (INDEPENDENT_AMBULATORY_CARE_PROVIDER_SITE_OTHER): Payer: Medicare HMO | Admitting: Family Medicine

## 2013-12-11 VITALS — BP 138/70 | HR 71 | Temp 97.7°F | Ht 67.0 in | Wt 185.2 lb

## 2013-12-11 DIAGNOSIS — I10 Essential (primary) hypertension: Secondary | ICD-10-CM

## 2013-12-11 DIAGNOSIS — E119 Type 2 diabetes mellitus without complications: Secondary | ICD-10-CM

## 2013-12-11 DIAGNOSIS — E785 Hyperlipidemia, unspecified: Secondary | ICD-10-CM

## 2013-12-11 LAB — POCT CBC
Granulocyte percent: 58.7 %G (ref 37–80)
HCT, POC: 45.1 % (ref 43.5–53.7)
Hemoglobin: 14.4 g/dL (ref 14.1–18.1)
Lymph, poc: 1.9 (ref 0.6–3.4)
MCH, POC: 27 pg (ref 27–31.2)
MCHC: 32 g/dL (ref 31.8–35.4)
MCV: 84.2 fL (ref 80–97)
MPV: 8.6 fL (ref 0–99.8)
POC Granulocyte: 3.1 (ref 2–6.9)
POC LYMPH PERCENT: 35.8 %L (ref 10–50)
Platelet Count, POC: 187 10*3/uL (ref 142–424)
RBC: 5.4 M/uL (ref 4.69–6.13)
RDW, POC: 13.5 %
WBC: 5.2 10*3/uL (ref 4.6–10.2)

## 2013-12-11 LAB — POCT UA - MICROALBUMIN: Microalbumin Ur, POC: 20 mg/L

## 2013-12-11 LAB — POCT GLYCOSYLATED HEMOGLOBIN (HGB A1C): Hemoglobin A1C: 9.1

## 2013-12-11 MED ORDER — GLIMEPIRIDE 4 MG PO TABS: 8.0000 mg | ORAL_TABLET | Freq: Every day | ORAL | Status: DC

## 2013-12-11 NOTE — Addendum Note (Signed)
Addended by: Prescott GumLAND, Lemuel Boodram M on: 12/11/2013 09:31 AM   Modules accepted: Orders

## 2013-12-11 NOTE — Progress Notes (Signed)
   Subjective:    Patient ID: George [name], male    DOB: Mar 07, 1946, 68 y.o.   MRN: [id number]  HPI  This 68 y.o. male presents for evaluation of diabetes, hypertension, and hyperlipidemia. His fsbs's fasting have been   Review of Systems    No chest pain, SOB, HA, dizziness, vision change, N/V, diarrhea, constipation, dysuria, urinary urgency or frequency, myalgias, arthralgias or rash.  Objective:   Physical Exam Vital signs noted  Well developed well nourished male.  HEENT - Head atraumatic Normocephalic                Eyes - PERRLA, Conjuctiva - clear Sclera- Clear EOMI                Ears - EAC's Wnl TM's Wnl Gross Hearing WNL                Nose - Nares patent                 Throat - oropharanx wnl Respiratory - Lungs CTA bilateral Cardiac - RRR S1 and S2 without murmur GI - Abdomen soft Nontender and bowel sounds active x 4 Extremities - No edema. Neuro - Grossly intact.      Assessment & Plan:  Diabetes mellitus, type 2 - Plan: POCT glycosylated hemoglobin (Hb A1C), POCT UA - Microalbumin, glimepiride (AMARYL) 4 MG tablet.  Diabetes is not controlled so will increase amaryl To $RemoveB'8mg'nnuFItin$  po qd  Hypertension - Plan: POCT CBC, CMP14+EGFR, Lipid panel  Hyperlipemia - Plan: CMP14+EGFR, Lipid panel  Lysbeth Penner FNP

## 2013-12-11 NOTE — Patient Instructions (Signed)

## 2013-12-12 LAB — LIPID PANEL
Chol/HDL Ratio: 4.3 ratio units (ref 0.0–5.0)
Cholesterol, Total: 200 mg/dL — ABNORMAL HIGH (ref 100–199)
HDL: 47 mg/dL (ref >39)
LDL Calculated: 133 mg/dL — ABNORMAL HIGH (ref 0–99)
Triglycerides: 98 mg/dL (ref 0–149)
VLDL Cholesterol Cal: 20 mg/dL (ref 5–40)

## 2013-12-12 LAB — CMP14+EGFR
ALT: 23 IU/L (ref 0–44)
AST: 15 IU/L (ref 0–40)
Albumin/Globulin Ratio: 2.2 (ref 1.1–2.5)
Albumin: 4.6 g/dL (ref 3.6–4.8)
Alkaline Phosphatase: 59 IU/L (ref 39–117)
BUN/Creatinine Ratio: 19 (ref 10–22)
BUN: 15 mg/dL (ref 8–27)
CO2: 25 mmol/L (ref 18–29)
Calcium: 9.7 mg/dL (ref 8.6–10.2)
Chloride: 97 mmol/L (ref 97–108)
Creatinine, Ser: 0.8 mg/dL (ref 0.76–1.27)
GFR calc Af Amer: 106 mL/min/{1.73_m2} (ref >59)
GFR calc non Af Amer: 92 mL/min/{1.73_m2} (ref >59)
Globulin, Total: 2.1 g/dL (ref 1.5–4.5)
Glucose: 179 mg/dL — ABNORMAL HIGH (ref 65–99)
Potassium: 4.5 mmol/L (ref 3.5–5.2)
Sodium: 139 mmol/L (ref 134–144)
Total Bilirubin: 0.3 mg/dL (ref 0.0–1.2)
Total Protein: 6.7 g/dL (ref 6.0–8.5)

## 2013-12-12 LAB — MICROALBUMIN, URINE: Microalbumin, Urine: 7.9 ug/mL (ref 0.0–17.0)

## 2013-12-13 ENCOUNTER — Other Ambulatory Visit: Payer: Self-pay | Admitting: Family Medicine

## 2013-12-13 MED ORDER — GLIPIZIDE 5 MG PO TABS: 5.0000 mg | ORAL_TABLET | Freq: Two times a day (BID) | ORAL | Status: DC

## 2013-12-14 ENCOUNTER — Ambulatory Visit: Payer: Medicare HMO | Admitting: Family Medicine

## 2014-03-08 ENCOUNTER — Other Ambulatory Visit: Payer: Self-pay | Admitting: Family Medicine

## 2014-04-26 ENCOUNTER — Encounter: Payer: Self-pay | Admitting: Family Medicine

## 2014-04-26 ENCOUNTER — Ambulatory Visit (INDEPENDENT_AMBULATORY_CARE_PROVIDER_SITE_OTHER): Payer: Medicare HMO | Admitting: Family Medicine

## 2014-04-26 VITALS — BP 132/67 | HR 67 | Temp 96.9°F | Ht 67.0 in | Wt 183.0 lb

## 2014-04-26 DIAGNOSIS — E119 Type 2 diabetes mellitus without complications: Secondary | ICD-10-CM

## 2014-04-26 DIAGNOSIS — E785 Hyperlipidemia, unspecified: Secondary | ICD-10-CM

## 2014-04-26 DIAGNOSIS — I10 Essential (primary) hypertension: Secondary | ICD-10-CM

## 2014-04-26 LAB — POCT CBC
Granulocyte percent: 62.6 %G (ref 37–80)
HCT, POC: 40.6 % — AB (ref 43.5–53.7)
Hemoglobin: 13.4 g/dL — AB (ref 14.1–18.1)
Lymph, poc: 1.9 (ref 0.6–3.4)
MCH, POC: 27.9 pg (ref 27–31.2)
MCHC: 33 g/dL (ref 31.8–35.4)
MCV: 84.4 fL (ref 80–97)
MPV: 8.9 fL (ref 0–99.8)
POC Granulocyte: 3.7 (ref 2–6.9)
POC LYMPH PERCENT: 31.6 %L (ref 10–50)
Platelet Count, POC: 176 10*3/uL (ref 142–424)
RBC: 4.8 M/uL (ref 4.69–6.13)
RDW, POC: 13.5 %
WBC: 5.9 10*3/uL (ref 4.6–10.2)

## 2014-04-26 LAB — POCT GLYCOSYLATED HEMOGLOBIN (HGB A1C): Hemoglobin A1C: 8.9

## 2014-04-26 NOTE — Progress Notes (Signed)
   Subjective:    Patient ID: George [name], male    DOB: 06-13-1946, 68 y.o.   MRN: [id number]  HPI This 68 y.o. male presents for evaluation of routine follow up.  He has hx of DM,hyperlipidemia, And hypertension. .   Review of Systems No chest pain, SOB, HA, dizziness, vision change, N/V, diarrhea, constipation, dysuria, urinary urgency or frequency, myalgias, arthralgias or rash.     Objective:   Physical Exam Vital signs noted  Well developed well nourished male.  HEENT - Head atraumatic Normocephalic                Eyes - PERRLA, Conjuctiva - clear Sclera- Clear EOMI                Ears - EAC's Wnl TM's Wnl Gross Hearing WNL                Nose - Nares patent                 Throat - oropharanx wnl Respiratory - Lungs CTA bilateral Cardiac - RRR S1 and S2 without murmur GI - Abdomen soft Nontender and bowel sounds active x 4 Extremities - No edema. Neuro - Grossly intact.       Assessment & Plan:  Type 2 diabetes mellitus without complication - Plan: POCT glycosylated hemoglobin (Hb A1C), CMP14+EGFR, Lipid panel  Hyperlipemia - Plan: CMP14+EGFR, Lipid panel  Essential hypertension - Plan: POCT CBC  Follow up in 3 months  Lysbeth Penner FNP

## 2014-04-27 LAB — CMP14+EGFR
ALT: 28 IU/L (ref 0–44)
AST: 19 IU/L (ref 0–40)
Albumin/Globulin Ratio: 2 (ref 1.1–2.5)
Albumin: 4.3 g/dL (ref 3.6–4.8)
Alkaline Phosphatase: 55 IU/L (ref 39–117)
BUN/Creatinine Ratio: 24 — ABNORMAL HIGH (ref 10–22)
BUN: 19 mg/dL (ref 8–27)
CO2: 22 mmol/L (ref 18–29)
Calcium: 9.5 mg/dL (ref 8.6–10.2)
Chloride: 96 mmol/L — ABNORMAL LOW (ref 97–108)
Creatinine, Ser: 0.78 mg/dL (ref 0.76–1.27)
GFR calc Af Amer: 106 mL/min/{1.73_m2} (ref >59)
GFR calc non Af Amer: 92 mL/min/{1.73_m2} (ref >59)
Globulin, Total: 2.1 g/dL (ref 1.5–4.5)
Glucose: 152 mg/dL — ABNORMAL HIGH (ref 65–99)
Potassium: 4.3 mmol/L (ref 3.5–5.2)
Sodium: 136 mmol/L (ref 134–144)
Total Bilirubin: 0.3 mg/dL (ref 0.0–1.2)
Total Protein: 6.4 g/dL (ref 6.0–8.5)

## 2014-04-27 LAB — LIPID PANEL
Chol/HDL Ratio: 4 ratio units (ref 0.0–5.0)
Cholesterol, Total: 168 mg/dL (ref 100–199)
HDL: 42 mg/dL (ref >39)
LDL Calculated: 110 mg/dL — ABNORMAL HIGH (ref 0–99)
Triglycerides: 78 mg/dL (ref 0–149)
VLDL Cholesterol Cal: 16 mg/dL (ref 5–40)

## 2014-04-29 ENCOUNTER — Other Ambulatory Visit: Payer: Self-pay | Admitting: Family Medicine

## 2014-06-06 ENCOUNTER — Other Ambulatory Visit: Payer: Self-pay | Admitting: Family Medicine

## 2014-06-17 ENCOUNTER — Encounter: Payer: Self-pay | Admitting: Pharmacist

## 2014-06-17 ENCOUNTER — Ambulatory Visit (INDEPENDENT_AMBULATORY_CARE_PROVIDER_SITE_OTHER): Payer: Medicare HMO | Admitting: Pharmacist

## 2014-06-17 VITALS — BP 150/82 | HR 74 | Ht 67.0 in | Wt 184.0 lb

## 2014-06-17 DIAGNOSIS — E782 Mixed hyperlipidemia: Secondary | ICD-10-CM

## 2014-06-17 DIAGNOSIS — I10 Essential (primary) hypertension: Secondary | ICD-10-CM

## 2014-06-17 DIAGNOSIS — E663 Overweight: Secondary | ICD-10-CM

## 2014-06-17 DIAGNOSIS — I152 Hypertension secondary to endocrine disorders: Secondary | ICD-10-CM | POA: Insufficient documentation

## 2014-06-17 DIAGNOSIS — E1159 Type 2 diabetes mellitus with other circulatory complications: Secondary | ICD-10-CM | POA: Insufficient documentation

## 2014-06-17 DIAGNOSIS — E119 Type 2 diabetes mellitus without complications: Secondary | ICD-10-CM

## 2014-06-17 DIAGNOSIS — E1169 Type 2 diabetes mellitus with other specified complication: Secondary | ICD-10-CM | POA: Insufficient documentation

## 2014-06-17 MED ORDER — CANAGLIFLOZIN 100 MG PO TABS: 100.0000 mg | ORAL_TABLET | Freq: Every morning | ORAL | Status: DC

## 2014-06-17 MED ORDER — ATORVASTATIN CALCIUM 80 MG PO TABS: 80.0000 mg | ORAL_TABLET | Freq: Every day | ORAL | Status: DC

## 2014-06-17 NOTE — Progress Notes (Signed)
Subjective:    George [name] is a 68 y.o. male who presents for an initial evaluation of Type 2 diabetes mellitus.  Current symptoms/problems include hyperglycemia, polydipsia and polyuria and have been unchanged. Symptoms have been present for 2 months.  The patient was initially diagnosed with Type 2 diabetes mellitus 10 or more years ago  Known diabetic complications: none Cardiovascular risk factors: advanced age (older than 22 for men, 59 for women), diabetes mellitus, dyslipidemia, family history of premature cardiovascular disease, hypertension and male gender Current diabetic medications include metformin  BID; glimepiride  2 tablets daily and glipizide  bid..   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  Doesn't' check at other times of day. Any episodes of hypoglycemia? no  Is He on ACE inhibitor or angiotensin II receptor blocker?  Yes  lisinopril (Prinivil)    The following portions of the patient's history were reviewed and updated as appropriate: allergies, current medications, past family history, past medical history, past social history, past surgical history and problem list.   Objective:    BP 150/82  Pulse 74  Ht  (1.702 m)  Wt 184 lb (83.462 kg)  BMI 28.81 kg/m2  General:  alert, cooperative, appears stated age, no distress and overweight / slight abdominal obesity  Oropharynx: lips, mucosa, and tongue normal; teeth and gums normal      Lab Review Glucose (mg/dL)  Date Value  2/95/6213 152*  12/11/2013 179*  09/14/2013 148*     Glucose, Bld (mg/dL)  Date Value  05/10/5783 227*  03/14/2013 129*     CO2 (mmol/L)   Date Value  04/26/2014 22   12/11/2013 25   09/14/2013 24      BUN (mg/dL)  Date Value  6/96/2952 19   12/11/2013 15   09/14/2013 16   07/06/2013 19   03/14/2013 21      Creat (mg/dL)  Date Value  8/41/3244 0.87      Creatinine, Ser (mg/dL)  Date Value  0/07/2724 0.78   12/11/2013 0.80   09/14/2013 0.77    Last A1c = 8.9% (04/26/2014)    Assessment:    Diabetes Mellitus type II, under poor control.  Hyperlipidemia complicated by uncontrolled type 2 DM - LDL elevated Hypertension - BP elevated today but last 2 visits in our office BP has been at goal   Plan:    1.  Rx changes:   Discontiue glipizide since also taking glimepiride  Add Invokana  1 tablet daily each morning   Change pravastatin to atrovastatin  1 tablet daily to get better LDL lowering 2.  Education: Reviewed 'ABCs' of diabetes management (respective goals in parentheses):  A1C (<7), blood pressure (<130/80), and cholesterol (LDL <100) 3.  Compliance at present is estimated to be good. Efforts to improve compliance (if necessary) will be directed at:  dietary modifications:   Increase non-starchy vegetables - carrots, green bean, squash, zucchini, tomatoes, onions, peppers, spinach and other green leafy vegetables, cabbage, lettuce, cucumbers, asparagus, okra (not fried), eggplant limit sugar and processed foods (cakes, cookies, ice cream, crackers and chips) Increase fresh fruit but limit serving sizes 1/2 cup or about the size  of tennis or baseball limit red meat to no more than 1-2 times per week (serving size about the size of your palm)             Choose whole grains / lean proteins - whole wheat bread, quinoa, whole grain rice (1/2 cup), fish, chicken, [place]  increased exercise and   regular blood sugar monitoring: one time daily - patient is instructed to check at varying times of day (it fasting and post prandial).  Goals of HBG monitoring discussed and how to respond to values over 250 or  less than 70 discussed. 4. Follow up: 4 weeks   Henrene Pastor, PharmD, CPP, CDE

## 2014-06-17 NOTE — Patient Instructions (Signed)
Diabetes and Standards of Medical Care  Diabetes is complicated. You may find that your diabetes team includes a dietitian, nurse, diabetes educator, eye doctor, and more. To help everyone know what is going on and to help you get the care you deserve, the following schedule of care was developed to help keep you on track. Below are the tests, exams, vaccines, medicines, education, and plans you will need.  Blood Glucose Goals Prior to meals = 80 - 130 Within 2 hours of the start of a meal = less than 180  HbA1c test (goal is less than 7.0% - your last value was 7.1%) This test shows how well you have controlled your glucose over the past 2 3 months. It is used to see if your diabetes management plan needs to be adjusted.   It is performed at least 2 times a year if you are meeting treatment goals.  It is performed 4 times a year if therapy has changed or if you are not meeting treatment goals.   Blood pressure test  This test is performed at every routine medical visit. The goal is less than 140/90 mmHg for most people, but 130/80 mmHg in some cases. Ask your health care provider about your goal. Dental exam  Follow up with the dentist regularly. Eye exam  If you are diagnosed with type 1 diabetes as a child, get an exam upon reaching the age of 10 years or older and have had diabetes for 3 5 years. Yearly eye exams are recommended after that initial eye exam.  If you are diagnosed with type 1 diabetes as an adult, get an exam within 5 years of diagnosis and then yearly.  If you are diagnosed with type 2 diabetes, get an exam as soon as possible after the diagnosis and then yearly. Foot care exam  Visual foot exams are performed at every routine medical visit. The exams check for cuts, injuries, or other problems with the feet.  A comprehensive foot exam should be done yearly. This includes visual inspection as well as assessing foot pulses and testing for loss of sensation.  Check  your feet nightly for cuts, injuries, or other problems with your feet. Tell your health care provider if anything is not healing. Kidney function test (urine microalbumin)  This test is performed once a year.  Type 1 diabetes: The first test is performed 5 years after diagnosis.  Type 2 diabetes: The first test is performed at the time of diagnosis.  A serum creatinine and estimated glomerular filtration rate (eGFR) test is done once a year to assess the level of chronic kidney disease (CKD), if present. Lipid profile (cholesterol, HDL, LDL, triglycerides)  Performed every 5 years for most people.  The goal for LDL is less than 100 mg/dL. If you are at high risk, the goal is less than 70 mg/dL.  The goal for HDL is 40 mg/dL 50 mg/dL for men and 50 mg/dL 60 mg/dL for women. An HDL cholesterol of 60 mg/dL or higher gives some protection against heart disease.  The goal for triglycerides is less than 150 mg/dL. Influenza vaccine, pneumococcal vaccine, and hepatitis B vaccine  The influenza vaccine is recommended yearly.  The pneumococcal vaccine is generally given once in a lifetime. However, there are some instances when another vaccination is recommended. Check with your health care provider.  The hepatitis B vaccine is also recommended for adults with diabetes. Diabetes self-management education  Education is recommended at diagnosis and ongoing   as needed. Treatment plan  Your treatment plan is reviewed at every medical visit. Document Released: 07/18/2009 Document Revised: 05/23/2013 Document Reviewed: 02/20/2013 [facility] Patient Information 2014 [place].   Hypoglycemia Hypoglycemia occurs when the glucose in your blood is too low. Glucose is a type of sugar that is your body's main energy source. Hormones, such as insulin and glucagon, control the level of glucose in the blood. Insulin lowers blood glucose and glucagon increases blood glucose. Having too much insulin  in your blood stream, or not eating enough food containing sugar, can result in hypoglycemia. Hypoglycemia can happen to people with or without diabetes. It can develop quickly and can be a medical emergency.  CAUSES   Missing or delaying meals.  Not eating enough carbohydrates at meals.  Taking too much diabetes medicine.  Not timing your oral diabetes medicine or insulin doses with meals, snacks, and exercise.  Nausea and vomiting.  Certain medicines.  Severe illnesses, such as hepatitis, kidney disorders, and certain eating disorders.  Increased activity or exercise without eating something extra or adjusting medicines.  Drinking too much alcohol.  A nerve disorder that affects body functions like your heart rate, blood pressure, and digestion (autonomic neuropathy).  A condition where the stomach muscles do not function properly (gastroparesis). Therefore, medicines and food may not absorb properly.  Rarely, a tumor of the pancreas can produce too much insulin. SYMPTOMS   Hunger.  Sweating (diaphoresis).  Change in body temperature.  Shakiness.  Headache.  Anxiety.  Lightheadedness.  Irritability.  Difficulty concentrating.  Dry mouth.  Tingling or numbness in the hands or feet.  Restless sleep or sleep disturbances.  Altered speech and coordination.  Change in mental status.  Seizures or prolonged convulsions.  Combativeness.  Drowsiness (lethargic).  Weakness.  Increased heart rate or palpitations.  Confusion.  Pale, gray skin color.  Blurred or double vision.  Fainting. DIAGNOSIS  A physical exam and medical history will be performed. Your caregiver may make a diagnosis based on your symptoms. Blood tests and other lab tests may be performed to confirm a diagnosis. Once the diagnosis is made, your caregiver will see if your signs and symptoms go away once your blood glucose is raised.  TREATMENT  Usually, you can easily treat your  hypoglycemia when you notice symptoms.  Check your blood glucose. If it is less than 70 mg/dl, take one of the following:   3-4 glucose tablets.    cup juice.    cup regular soda.   1 cup skim milk.   -1 tube of glucose gel.   5-6 hard candies.   Avoid high-fat drinks or food that may delay a rise in blood glucose levels.  Do not take more than the recommended amount of sugary foods, drinks, gel, or tablets. Doing so will cause your blood glucose to go too high.   Wait 10-15 minutes and recheck your blood glucose. If it is still less than 70 mg/dl or below your target range, repeat treatment.   Eat a snack if it is more than 1 hour until your next meal.  There may be a time when your blood glucose may go so low that you are unable to treat yourself at home when you start to notice symptoms. You may need someone to help you. You may even faint or be unable to swallow. If you cannot treat yourself, someone will need to bring you to the hospital.  [place]  If you have diabetes, follow  your diabetes management plan by:  Taking your medicines as directed.  Following your exercise plan.  Following your meal plan. Do not skip meals. Eat on time.  Testing your blood glucose regularly. Check your blood glucose before and after exercise. If you exercise longer or different than usual, be sure to check blood glucose more frequently.  Wearing your medical alert jewelry that says you have diabetes.  Identify the cause of your hypoglycemia. Then, develop ways to prevent the recurrence of hypoglycemia.  Do not take a hot bath or shower right after an insulin shot.  Always carry treatment with you. Glucose tablets are the easiest to carry.  If you are going to drink alcohol, drink it only with meals.  Tell friends or family members ways to keep you safe during a seizure. This may include removing hard or sharp objects from the area or turning you on your  side.  Maintain a healthy weight. SEEK MEDICAL CARE IF:   You are having problems keeping your blood glucose in your target range.  You are having frequent episodes of hypoglycemia.  You feel you might be having side effects from your medicines.  You are not sure why your blood glucose is dropping so low.  You notice a change in vision or a new problem with your vision. SEEK IMMEDIATE MEDICAL CARE IF:   Confusion develops.  A change in mental status occurs.  The inability to swallow develops.  Fainting occurs. Document Released: 09/20/2005 Document Revised: 09/25/2013 Document Reviewed: 01/17/2012 [facility] Patient Information 2015 [place], [place]. This information is not intended to replace advice given to you by your health care provider. Make sure you discuss any questions you have with your health care provider.

## 2014-07-15 ENCOUNTER — Encounter: Payer: Self-pay | Admitting: Pharmacist

## 2014-07-15 ENCOUNTER — Encounter (INDEPENDENT_AMBULATORY_CARE_PROVIDER_SITE_OTHER): Payer: Self-pay

## 2014-07-15 ENCOUNTER — Ambulatory Visit (INDEPENDENT_AMBULATORY_CARE_PROVIDER_SITE_OTHER): Payer: Medicare HMO | Admitting: Pharmacist

## 2014-07-15 VITALS — BP 140/78 | HR 72 | Ht 67.0 in | Wt 183.0 lb

## 2014-07-15 DIAGNOSIS — E119 Type 2 diabetes mellitus without complications: Secondary | ICD-10-CM

## 2014-07-15 DIAGNOSIS — Z23 Encounter for immunization: Secondary | ICD-10-CM

## 2014-07-15 DIAGNOSIS — Z0000 Encounter for general adult medical examination without abnormal findings: Secondary | ICD-10-CM

## 2014-07-15 MED ORDER — CANAGLIFLOZIN 100 MG PO TABS: 100.0000 mg | ORAL_TABLET | Freq: Every morning | ORAL | Status: DC

## 2014-07-15 NOTE — Patient Instructions (Signed)
Health Maintenance Summary    TETANUS/TDAP Overdue 12/28/1963  checking on insurance coverage    ZOSTAVAX Overdue 12/27/2004  checking on insurance coverage    HEMOGLOBIN A1C Next Due 10/27/2014      OPHTHALMOLOGY EXAM Next Due 12/03/2014      URINE MICROALBUMIN Next Due 12/12/2014      INFLUENZA VACCINE Next Due 05/05/2015      FOOT EXAM Next Due 07/16/2015     Pneumonia Vaccine Completed Prevnar 13 given today     COLONOSCOPY Next Due 08/21/2023        Preventive Care for Adults A healthy lifestyle and preventive care can promote health and wellness. Preventive health guidelines for men include the following key practices:  A routine yearly physical is a good way to check with your health care provider about your health and preventative screening. It is a chance to share any concerns and updates on your health and to receive a thorough exam.  Visit your dentist for a routine exam and preventative care every 6 months. Brush your teeth twice a day and floss once a day. Good oral hygiene prevents tooth decay and gum disease.  The frequency of eye exams is based on your age, health, family medical history, use of contact lenses, and other factors. Follow your health care provider's recommendations for frequency of eye exams.  Eat a healthy diet. Foods such as vegetables, fruits, whole grains, low-fat dairy products, and lean protein foods contain the nutrients you need without too many calories. Decrease your intake of foods high in solid fats, added sugars, and salt. Eat the right amount of calories for you.Get information about a proper diet from your health care provider, if necessary.  Regular physical exercise is one of the most important things you can do for your health. Most adults should get at least 150 minutes of moderate-intensity exercise (any activity that increases your heart rate and causes you to sweat) each week. In addition, most adults need muscle-strengthening exercises on 2 or  more days a week.  Maintain a healthy weight. The body mass index (BMI) is a screening tool to identify possible weight problems. It provides an estimate of body fat based on height and weight. Your health care provider can find your BMI and can help you achieve or maintain a healthy weight.For adults 20 years and older:  A BMI below 18.5 is considered underweight.  A BMI of 18.5 to 24.9 is normal.  A BMI of 25 to 29.9 is considered overweight.  A BMI of 30 and above is considered obese.  Maintain normal blood lipids and cholesterol levels by exercising and minimizing your intake of saturated fat. Eat a balanced diet with plenty of fruit and vegetables. Blood tests for lipids and cholesterol should begin at age 7 and be repeated every 5 years. If your lipid or cholesterol levels are high, you are over 50, or you are at high risk for heart disease, you may need your cholesterol levels checked more frequently.Ongoing high lipid and cholesterol levels should be treated with medicines if diet and exercise are not working.  If you smoke, find out from your health care provider how to quit. If you do not use tobacco, do not start.  Lung cancer screening is recommended for adults aged 44-80 years who are at high risk for developing lung cancer because of a history of smoking. A yearly low-dose CT scan of the lungs is recommended for people who have at least a 30-pack-year history  of smoking and are a current smoker or have quit within the past 15 years. A pack year of smoking is smoking an average of 1 pack of cigarettes a day for 1 year (for example: 1 pack a day for 30 years or 2 packs a day for 15 years). Yearly screening should continue until the smoker has stopped smoking for at least 15 years. Yearly screening should be stopped for people who develop a health problem that would prevent them from having lung cancer treatment.  If you choose to drink alcohol, do not have more than 2 drinks per day.  One drink is considered to be 12 ounces (355 mL) of beer, 5 ounces (148 mL) of wine, or 1.5 ounces (44 mL) of liquor.  Avoid use of street drugs. Do not share needles with anyone. Ask for help if you need support or instructions about stopping the use of drugs.  High blood pressure causes heart disease and increases the risk of stroke. Your blood pressure should be checked at least every 1-2 years. Ongoing high blood pressure should be treated with medicines, if weight loss and exercise are not effective.  If you are 6-66 years old, ask your health care provider if you should take aspirin to prevent heart disease.  Diabetes screening involves taking a blood sample to check your fasting blood sugar level. This should be done once every 3 years, after age 25, if you are within normal weight and without risk factors for diabetes. Testing should be considered at a younger age or be carried out more frequently if you are overweight and have at least 1 risk factor for diabetes.  Colorectal cancer can be detected and often prevented. Most routine colorectal cancer screening begins at the age of 62 and continues through age 43. However, your health care provider may recommend screening at an earlier age if you have risk factors for colon cancer. On a yearly basis, your health care provider may provide home test kits to check for hidden blood in the stool. Use of a small camera at the end of a tube to directly examine the colon (sigmoidoscopy or colonoscopy) can detect the earliest forms of colorectal cancer. Talk to your health care provider about this at age 56, when routine screening begins. Direct exam of the colon should be repeated every 5-10 years through age 12, unless early forms of precancerous polyps or small growths are found.  People who are at an increased risk for hepatitis B should be screened for this virus. You are considered at high risk for hepatitis B if:  You were born in a country where  hepatitis B occurs often. Talk with your health care provider about which countries are considered high risk.  Your parents were born in a high-risk country and you have not received a shot to protect against hepatitis B (hepatitis B vaccine).  You have HIV or AIDS.  You use needles to inject street drugs.  You live with, or have sex with, someone who has hepatitis B.  You are a man who has sex with other men (MSM).  You get hemodialysis treatment.  You take certain medicines for conditions such as cancer, organ transplantation, and autoimmune conditions.  Hepatitis C blood testing is recommended for all people born from [birth year] through 1965 and any individual with known risks for hepatitis C.  Practice safe sex. Use condoms and avoid high-risk sexual practices to reduce the spread of sexually transmitted infections (STIs). STIs include gonorrhea, chlamydia,  syphilis, trichomonas, herpes, HPV, and human immunodeficiency virus (HIV). Herpes, HIV, and HPV are viral illnesses that have no cure. They can result in disability, cancer, and death.  If you are at risk of being infected with HIV, it is recommended that you take a prescription medicine daily to prevent HIV infection. This is called preexposure prophylaxis (PrEP). You are considered at risk if:  You are a man who has sex with other men (MSM) and have other risk factors.  You are a heterosexual man, are sexually active, and are at increased risk for HIV infection.  You take drugs by injection.  You are sexually active with a partner who has HIV.  Talk with your health care provider about whether you are at high risk of being infected with HIV. If you choose to begin PrEP, you should first be tested for HIV. You should then be tested every 3 months for as long as you are taking PrEP.  A one-time screening for abdominal aortic aneurysm (AAA) and surgical repair of large AAAs by ultrasound are recommended for men ages 63 to 36 years  who are current or former smokers.  Healthy men should no longer receive prostate-specific antigen (PSA) blood tests as part of routine cancer screening. Talk with your health care provider about prostate cancer screening.  Testicular cancer screening is not recommended for adult males who have no symptoms. Screening includes self-exam, a health care provider exam, and other screening tests. Consult with your health care provider about any symptoms you have or any concerns you have about testicular cancer.  Use sunscreen. Apply sunscreen liberally and repeatedly throughout the day. You should seek shade when your shadow is shorter than you. Protect yourself by wearing long sleeves, pants, a wide-brimmed hat, and sunglasses year round, whenever you are outdoors.  Once a month, do a whole-body skin exam, using a mirror to look at the skin on your back. Tell your health care provider about new moles, moles that have irregular borders, moles that are larger than a pencil eraser, or moles that have changed in shape or color.  Stay current with required vaccines (immunizations).  Influenza vaccine. All adults should be immunized every year.  Tetanus, diphtheria, and acellular pertussis (Td, Tdap) vaccine. An adult who has not previously received Tdap or who does not know his vaccine status should receive 1 dose of Tdap. This initial dose should be followed by tetanus and diphtheria toxoids (Td) booster doses every 10 years. Adults with an unknown or incomplete history of completing a 3-dose immunization series with Td-containing vaccines should begin or complete a primary immunization series including a Tdap dose. Adults should receive a Td booster every 10 years.  Varicella vaccine. An adult without evidence of immunity to varicella should receive 2 doses or a second dose if he has previously received 1 dose.  Human papillomavirus (HPV) vaccine. Males aged 53-21 years who have not received the vaccine  previously should receive the 3-dose series. Males aged 22-26 years may be immunized. Immunization is recommended through the age of 86 years for any male who has sex with males and did not get any or all doses earlier. Immunization is recommended for any person with an immunocompromised condition through the age of 28 years if he did not get any or all doses earlier. During the 3-dose series, the second dose should be obtained 4-8 weeks after the first dose. The third dose should be obtained 24 weeks after the first dose and 16 weeks  after the second dose.  Zoster vaccine. One dose is recommended for adults aged 58 years or older unless certain conditions are present.  Measles, mumps, and rubella (MMR) vaccine. Adults born before [birth year] generally are considered immune to measles and mumps. Adults born in [birth year] or later should have 1 or more doses of MMR vaccine unless there is a contraindication to the vaccine or there is laboratory evidence of immunity to each of the three diseases. A routine second dose of MMR vaccine should be obtained at least 28 days after the first dose for students attending postsecondary schools, health care workers, or international travelers. People who received inactivated measles vaccine or an unknown type of measles vaccine during 1963-1967 should receive 2 doses of MMR vaccine. People who received inactivated mumps vaccine or an unknown type of mumps vaccine before 1979 and are at high risk for mumps infection should consider immunization with 2 doses of MMR vaccine. Unvaccinated health care workers born before [birth year] who lack laboratory evidence of measles, mumps, or rubella immunity or laboratory confirmation of disease should consider measles and mumps immunization with 2 doses of MMR vaccine or rubella immunization with 1 dose of MMR vaccine.  Pneumococcal 13-valent conjugate (PCV13) vaccine. When indicated, a person who is uncertain of his immunization history and has no record  of immunization should receive the PCV13 vaccine. An adult aged 79 years or older who has certain medical conditions and has not been previously immunized should receive 1 dose of PCV13 vaccine. This PCV13 should be followed with a dose of pneumococcal polysaccharide (PPSV23) vaccine. The PPSV23 vaccine dose should be obtained at least 8 weeks after the dose of PCV13 vaccine. An adult aged 1 years or older who has certain medical conditions and previously received 1 or more doses of PPSV23 vaccine should receive 1 dose of PCV13. The PCV13 vaccine dose should be obtained 1 or more years after the last PPSV23 vaccine dose.  Pneumococcal polysaccharide (PPSV23) vaccine. When PCV13 is also indicated, PCV13 should be obtained first. All adults aged 38 years and older should be immunized. An adult younger than age 74 years who has certain medical conditions should be immunized. Any person who resides in a nursing home or long-term care facility should be immunized. An adult smoker should be immunized. People with an immunocompromised condition and certain other conditions should receive both PCV13 and PPSV23 vaccines. People with human immunodeficiency virus (HIV) infection should be immunized as soon as possible after diagnosis. Immunization during chemotherapy or radiation therapy should be avoided. Routine use of PPSV23 vaccine is not recommended for American Indians, [place] Natives, or people younger than 65 years unless there are medical conditions that require PPSV23 vaccine. When indicated, people who have unknown immunization and have no record of immunization should receive PPSV23 vaccine. One-time revaccination 5 years after the first dose of PPSV23 is recommended for people aged 19-64 years who have chronic kidney failure, nephrotic syndrome, asplenia, or immunocompromised conditions. People who received 1-2 doses of PPSV23 before age 9 years should receive another dose of PPSV23 vaccine at age 58 years or  later if at least 5 years have passed since the previous dose. Doses of PPSV23 are not needed for people immunized with PPSV23 at or after age 69 years.  Meningococcal vaccine. Adults with asplenia or persistent complement component deficiencies should receive 2 doses of quadrivalent meningococcal conjugate (MenACWY-D) vaccine. The doses should be obtained at least 2 months apart. Microbiologists working with certain meningococcal bacteria, [employer] recruits,  people at risk during an outbreak, and people who travel to or live in countries with a high rate of meningitis should be immunized. A first-year college student up through age 32 years who is living in a residence hall should receive a dose if he did not receive a dose on or after his 16th birthday. Adults who have certain high-risk conditions should receive one or more doses of vaccine.  Hepatitis A vaccine. Adults who wish to be protected from this disease, have certain high-risk conditions, work with hepatitis A-infected animals, work in hepatitis A research labs, or travel to or work in countries with a high rate of hepatitis A should be immunized. Adults who were previously unvaccinated and who anticipate close contact with an international adoptee during the first 60 days after arrival in the [place] from a country with a high rate of hepatitis A should be immunized.  Hepatitis B vaccine. Adults should be immunized if they wish to be protected from this disease, have certain high-risk conditions, may be exposed to blood or other infectious body fluids, are household contacts or sex partners of hepatitis B positive people, are clients or workers in certain care facilities, or travel to or work in countries with a high rate of hepatitis B.  Haemophilus influenzae type b (Hib) vaccine. A previously unvaccinated person with asplenia or sickle cell disease or having a scheduled splenectomy should receive 1 dose of Hib vaccine. Regardless of  previous immunization, a recipient of a hematopoietic stem cell transplant should receive a 3-dose series 6-12 months after his successful transplant. Hib vaccine is not recommended for adults with HIV infection. Preventive Service / Frequency Ages 58 to 76  Blood pressure check.** / Every 1 to 2 years.  Lipid and cholesterol check.** / Every 5 years beginning at age 47.  Hepatitis C blood test.** / For any individual with known risks for hepatitis C.  Skin self-exam. / Monthly.  Influenza vaccine. / Every year.  Tetanus, diphtheria, and acellular pertussis (Tdap, Td) vaccine.** / Consult your health care provider. 1 dose of Td every 10 years.  Varicella vaccine.** / Consult your health care provider.  HPV vaccine. / 3 doses over 6 months, if 80 or younger.  Measles, mumps, rubella (MMR) vaccine.** / You need at least 1 dose of MMR if you were born in 1957 or later. You may also need a second dose.  Pneumococcal 13-valent conjugate (PCV13) vaccine.** / Consult your health care provider.  Pneumococcal polysaccharide (PPSV23) vaccine.** / 1 to 2 doses if you smoke cigarettes or if you have certain conditions.  Meningococcal vaccine.** / 1 dose if you are age 85 to 38 years and a Market researcher living in a residence hall, or have one of several medical conditions. You may also need additional booster doses.  Hepatitis A vaccine.** / Consult your health care provider.  Hepatitis B vaccine.** / Consult your health care provider.  Ages 13 and over  Blood pressure check.** / Every 1 to 2 years.  Lipid and cholesterol check.**/ Every 5 years beginning at age 69.  Lung cancer screening. / Every year if you are aged 39-80 years and have a 30-pack-year history of smoking and currently smoke or have quit within the past 15 years. Yearly screening is stopped once you have quit smoking for at least 15 years or develop a health problem that would prevent you from having lung cancer  treatment.  Fecal occult blood test (FOBT) of stool. / Every year  beginning at age 60 and continuing until age 69. You may not have to do this test if you get a colonoscopy every 10 years.  Flexible sigmoidoscopy** or colonoscopy.** / Every 5 years for a flexible sigmoidoscopy or every 10 years for a colonoscopy beginning at age 55 and continuing until age 59.  Hepatitis C blood test.** / For all people born from [birth year] through 1966 and any individual with known risks for hepatitis C.  Abdominal aortic aneurysm (AAA) screening.** / A one-time screening for ages 26 to 52 years who are current or former smokers.  Skin self-exam. / Monthly.  Influenza vaccine. / Every year.  Tetanus, diphtheria, and acellular pertussis (Tdap/Td) vaccine.** / 1 dose of Td every 10 years.  Varicella vaccine.** / Consult your health care provider.  Zoster vaccine.** / 1 dose for adults aged 29 years or older.  Pneumococcal 13-valent conjugate (PCV13) vaccine.** / Consult your health care provider.  Pneumococcal polysaccharide (PPSV23) vaccine.** / 1 dose for all adults aged 64 years and older.  Meningococcal vaccine.** / Consult your health care provider.  Hepatitis A vaccine.** / Consult your health care provider.  Hepatitis B vaccine.** / Consult your health care provider.  Haemophilus influenzae type b (Hib) vaccine.** / Consult your health care provider. **Family history and personal history of risk and conditions may change your health care provider's recommendations. Document Released: 11/16/2001 Document Revised: 09/25/2013 Document Reviewed: 02/15/2011 [facility] Patient Information 2015 [place], [place]. This information is not intended to replace advice given to you by your health care provider. Make sure you discuss any questions you have with your health care provider.

## 2014-07-15 NOTE — Progress Notes (Addendum)
Subjective:    George [name] is a 68 y.o. male who presents for Medicare Initial Wellness Visit and recheck type 2 DM.   Patient was seen about 4-6 weeks ago for diabetes education.  Also started Invokana 100mg  1 tablet daily. He reports that BG had been better - checks about every other day.  Highest HBG reading = 226 Lowest HBG reading = 112 Denies hypoglycemia.   Preventive Screening-Counseling & Management  Tobacco History  Smoking status  . Former Smoker -- 1.00 packs/day for 15 years  . Types: Cigarettes  . Start date: 12/27/1965  . Quit date: 06/16/1983  Smokeless tobacco  . Not on file    Current Problems (verified) Patient Active Problem List   Diagnosis Date Noted  . Type 2 diabetes mellitus not at goal 06/17/2014  . Mixed hyperlipidemia due to type 2 diabetes mellitus 06/17/2014  . Essential hypertension, benign 06/17/2014  . Overweight (BMI 25.0-29.9) 06/17/2014    Medications Prior to Visit Current Outpatient Prescriptions on File Prior to Visit  Medication Sig Dispense Refill  . aspirin 81 MG tablet Take 81 mg by mouth daily.      Marland Kitchen. atorvastatin (LIPITOR) 80 MG tablet Take 1 tablet (80 mg total) by mouth daily.  90 tablet  1  . calcium carbonate (OS-CAL) 600 MG TABS Take 600 mg by mouth 2 (two) times daily with a meal.      . Canagliflozin (INVOKANA) 100 MG TABS Take 1 tablet (100 mg total) by mouth every morning.  30 tablet  0  . CINNAMON PO Take 1,000 mg by mouth 2 (two) times daily.      . fish oil-omega-3 fatty acids 1000 MG capsule Take 2 g by mouth daily.      Marland Kitchen. glimepiride (AMARYL) 4 MG tablet Take 2 tablets (8 mg total) by mouth daily before breakfast.  180 tablet  3  . lisinopril (PRINIVIL,ZESTRIL) 10 MG tablet TAKE ONE TABLET BY MOUTH ONE TIME DAILY  90 tablet  1  . metFORMIN (GLUCOPHAGE) 1000 MG tablet TAKE ONE TABLET BY MOUTH TWICE A DAY WITH MEALS  180 tablet  0   No current facility-administered medications on file prior to visit.     Current Medications (verified) Current Outpatient Prescriptions  Medication Sig Dispense Refill  . aspirin 81 MG tablet Take 81 mg by mouth daily.      Marland Kitchen. atorvastatin (LIPITOR) 80 MG tablet Take 1 tablet (80 mg total) by mouth daily.  90 tablet  1  . calcium carbonate (OS-CAL) 600 MG TABS Take 600 mg by mouth 2 (two) times daily with a meal.      . Canagliflozin (INVOKANA) 100 MG TABS Take 1 tablet (100 mg total) by mouth every morning.  30 tablet  0  . CINNAMON PO Take 1,000 mg by mouth 2 (two) times daily.      . fish oil-omega-3 fatty acids 1000 MG capsule Take 2 g by mouth daily.      Marland Kitchen. glimepiride (AMARYL) 4 MG tablet Take 2 tablets (8 mg total) by mouth daily before breakfast.  180 tablet  3  . lisinopril (PRINIVIL,ZESTRIL) 10 MG tablet TAKE ONE TABLET BY MOUTH ONE TIME DAILY  90 tablet  1  . metFORMIN (GLUCOPHAGE) 1000 MG tablet TAKE ONE TABLET BY MOUTH TWICE A DAY WITH MEALS  180 tablet  0   No current facility-administered medications for this visit.     Allergies (verified) Review of patient's allergies indicates no known allergies.  PAST HISTORY  Family History Family History  Problem Relation Age of Onset  . Cancer Sister   . COPD Sister   . Heart disease Sister   . Heart attack Brother   . Heart disease Brother     STENT  . Healthy Brother   . Healthy Brother   . Colon cancer Neg Hx   . Pancreatic cancer Neg Hx   . Stomach cancer Neg Hx   . Diabetes Maternal Aunt   . Diabetes Maternal Uncle     Social History History  Substance Use Topics  . Smoking status: Former Smoker -- 1.00 packs/day for 15 years    Types: Cigarettes    Start date: 12/27/1965    Quit date: 06/16/1983  . Smokeless tobacco: Not on file  . Alcohol Use: No    Are there smokers in your home (other than you)?  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  Depression Screen (Note: if answer to either of the following is "Yes", a more complete depression screening is indicated)   Q1: Over the past two weeks, have you felt down, depressed or hopeless? No  Q2: Over the past two weeks, have you felt little interest or pleasure in doing things? No  Have you lost interest or pleasure in daily life? No  Do you often feel hopeless? No  Do you cry easily over simple problems? No  Activities of Daily Living In your present state of health, do you have any difficulty performing the following activities?:  Driving? No Managing money?  No Feeding yourself? No Getting from bed to chair? No Climbing a flight of stairs? No Preparing food and eating?: No Bathing or showering? No Getting dressed: No Getting to the toilet? No Using the toilet:No Moving around from place to place: No In the past year have you fallen or had a near fall?:No   Are you sexually active?  Yes  Do you have more than one partner?  No  Hearing Difficulties: No Do you often ask people to speak up or repeat themselves? No Do you experience ringing or noises in your ears? No Do you have difficulty understanding soft or whispered voices? No   Do you feel that you have a problem with memory? No  Do you often misplace items? No  Do you feel safe at home?  Yes  Cognitive Testing  Alert? Yes  Normal Appearance?Yes  Oriented to person? Yes  Place? Yes   Time? Yes  Recall of three objects?  Yes  Can perform simple calculations? Yes  Displays appropriate judgment?Yes  Can read the correct time from a watch face?Yes   Advanced Directives have been discussed with the patient? Yes   List the Names of Other Physician/Practitioners you currently use: 1.    Indicate any recent Medical Services you may have received from other than [facility] providers in the past year (date may  be approximate).  Immunization History  Administered Date(s) Administered  . Influenza, High Dose Seasonal PF 06/06/2014    Screening Tests Health Maintenance  Topic Date Due  . Foot Exam  12/28/1954  . Tetanus/tdap  12/28/1963  . Zostavax  12/27/2004  . Hemoglobin A1c  10/27/2014  . Ophthalmology Exam  12/03/2014  . Urine Microalbumin  12/12/2014  .  Influenza Vaccine  05/05/2015  . Colonoscopy  08/21/2023  . Pneumococcal Polysaccharide Vaccine Age 69 And Over  Completed    All answers were reviewed with the patient and necessary referrals were made:  Henrene Pastorckard, Moe Brier, [facility]   07/15/2014   History reviewed: allergies, current medications, past family history, past medical history, past social history, past surgical history and problem list   Objective:  There were no vitals taken for this visit. There is no weight on file to calculate BMI.   Assessment:     Initial Medicare Wellness Visit Type 2 DM - improving      Plan:     During the course of the visit the patient was educated and counseled about appropriate screening and preventive services including:    Pneumococcal vaccine - Prevnar 13 given today  Influenza vaccine - UTD  Td vaccine - checking on cost  Zostavax - checking on cost  Prostate cancer screening - due to check with next visit with PCP / blood work  Colorectal cancer screening - UTD  Glaucoma screening - reports eye exam 12/02/2013 - need to request copy of visit  Nutrition counseling - continue to limiting high CHO containing foods  Advanced directives: patient given caring connections packet  Performed diabetic food exam and educated about proper food care.  Continue Invokana 100mg  1 tablet qam - gave #30 samples in office today.  Diet review for nutrition referral? Yes ____  Not Indicated ____   Patient Instructions (the written plan) was given to the patient.  Medicare Attestation I have personally reviewed: The patient's  medical and social history Their use of alcohol, tobacco or illicit drugs Their current medications and supplements The patient's functional ability including ADLs,fall risks, home safety risks, cognitive, and hearing and visual impairment Diet and physical activities Evidence for depression or mood disorders  The patient's weight, height, BMI, and visual acuity have been recorded in the chart.  I have made referrals, counseling, and provided education to the patient based on review of the above and I have provided the patient with a written personalized care plan for preventive services.     Henrene Pastorckard, Sereen Schaff, [facility]   07/15/2014      Verified patient's optometrist is Dr Despina AriasYen Le - requested copy of last exam Verified cost of Zostavax - $45 and Boostrix / Tdap - $61.07 and patient notified.

## 2014-08-02 ENCOUNTER — Encounter: Payer: Self-pay | Admitting: Family Medicine

## 2014-08-02 ENCOUNTER — Ambulatory Visit (INDEPENDENT_AMBULATORY_CARE_PROVIDER_SITE_OTHER): Payer: Medicare HMO | Admitting: Family Medicine

## 2014-08-02 VITALS — BP 136/74 | HR 56 | Temp 97.2°F | Ht 67.0 in | Wt 179.6 lb

## 2014-08-02 DIAGNOSIS — R5383 Other fatigue: Secondary | ICD-10-CM

## 2014-08-02 DIAGNOSIS — E785 Hyperlipidemia, unspecified: Secondary | ICD-10-CM

## 2014-08-02 DIAGNOSIS — Z139 Encounter for screening, unspecified: Secondary | ICD-10-CM

## 2014-08-02 DIAGNOSIS — E119 Type 2 diabetes mellitus without complications: Secondary | ICD-10-CM

## 2014-08-02 LAB — POCT CBC
Granulocyte percent: 72.3 %G (ref 37–80)
HCT, POC: 42.5 % — AB (ref 43.5–53.7)
Hemoglobin: 14 g/dL — AB (ref 14.1–18.1)
Lymph, poc: 1.8 (ref 0.6–3.4)
MCH, POC: 27.6 pg (ref 27–31.2)
MCHC: 32.9 g/dL (ref 31.8–35.4)
MCV: 83.9 fL (ref 80–97)
MPV: 8.1 fL (ref 0–99.8)
POC Granulocyte: 5.6 (ref 2–6.9)
POC LYMPH PERCENT: 24 %L (ref 10–50)
Platelet Count, POC: 218 10*3/uL (ref 142–424)
RBC: 5.1 M/uL (ref 4.69–6.13)
RDW, POC: 13.1 %
WBC: 7.7 10*3/uL (ref 4.6–10.2)

## 2014-08-02 LAB — POCT GLYCOSYLATED HEMOGLOBIN (HGB A1C): Hemoglobin A1C: 8.6

## 2014-08-02 NOTE — Progress Notes (Signed)
   Subjective:    Patient ID: George [name], male    DOB: 07/18/1946, 68 y.o.   MRN: [id number]  HPI Patient is here for annual exam.  Review of Systems  Constitutional: Negative for fever.  HENT: Negative for ear pain.   Eyes: Negative for discharge.  Respiratory: Negative for cough.   Cardiovascular: Negative for chest pain.  Gastrointestinal: Negative for abdominal distention.  Endocrine: Negative for polyuria.  Genitourinary: Negative for difficulty urinating.  Musculoskeletal: Negative for gait problem and neck pain.  Skin: Negative for color change and rash.  Neurological: Negative for speech difficulty and headaches.  Psychiatric/Behavioral: Negative for agitation.       Objective:    BP 136/74  Pulse 56  Temp(Src) 97.2 F (36.2 C) (Oral)  Ht _0  (1.702 m)  Wt 179 lb 9.6 oz (81.466 kg)  BMI 28.12 kg/m2 Physical Exam  Constitutional: He is oriented to person, place, and time. He appears well-developed and well-nourished.  HENT:  Head: Normocephalic and atraumatic.  Mouth/Throat: Oropharynx is clear and moist.  Eyes: Pupils are equal, round, and reactive to light.  Neck: Normal range of motion. Neck supple.  Cardiovascular: Normal rate and regular rhythm.   No murmur heard. Pulmonary/Chest: Effort normal and breath sounds normal.  Abdominal: Soft. Bowel sounds are normal. There is no tenderness.  Neurological: He is alert and oriented to person, place, and time.  Skin: Skin is warm and dry.  Psychiatric: He has a normal mood and affect.          Assessment & Plan:     ICD-9-CM ICD-10-CM   1. Screening V82.9 Z13.9 PSA, total and free  2. Hyperlipemia 272.4 E78.5 Lipid panel  3. Diabetes mellitus type 2, controlled, without complications 037.54 H60.6 POCT glycosylated hemoglobin (Hb A1C)     CMP14+EGFR     Lipid panel  4. Other fatigue 780.79 R53.83 POCT CBC     No Follow-up on file.  Lysbeth Penner FNP

## 2014-08-03 LAB — CMP14+EGFR
ALT: 21 IU/L (ref 0–44)
AST: 14 IU/L (ref 0–40)
Albumin/Globulin Ratio: 1.7 (ref 1.1–2.5)
Albumin: 4.3 g/dL (ref 3.6–4.8)
Alkaline Phosphatase: 70 IU/L (ref 39–117)
BUN/Creatinine Ratio: 21 (ref 10–22)
BUN: 16 mg/dL (ref 8–27)
CO2: 22 mmol/L (ref 18–29)
Calcium: 10 mg/dL (ref 8.6–10.2)
Chloride: 97 mmol/L (ref 97–108)
Creatinine, Ser: 0.76 mg/dL (ref 0.76–1.27)
GFR calc Af Amer: 108 mL/min/{1.73_m2} (ref >59)
GFR calc non Af Amer: 93 mL/min/{1.73_m2} (ref >59)
Globulin, Total: 2.5 g/dL (ref 1.5–4.5)
Glucose: 95 mg/dL (ref 65–99)
Potassium: 4.3 mmol/L (ref 3.5–5.2)
Sodium: 135 mmol/L (ref 134–144)
Total Bilirubin: 0.3 mg/dL (ref 0.0–1.2)
Total Protein: 6.8 g/dL (ref 6.0–8.5)

## 2014-08-03 LAB — LIPID PANEL
Chol/HDL Ratio: 4 ratio units (ref 0.0–5.0)
Cholesterol, Total: 202 mg/dL — ABNORMAL HIGH (ref 100–199)
HDL: 50 mg/dL (ref >39)
LDL Calculated: 135 mg/dL — ABNORMAL HIGH (ref 0–99)
Triglycerides: 83 mg/dL (ref 0–149)
VLDL Cholesterol Cal: 17 mg/dL (ref 5–40)

## 2014-08-03 LAB — PSA, TOTAL AND FREE
PSA, Free Pct: 34.4 %
PSA, Free: 0.86 ng/mL
PSA: 2.5 ng/mL (ref 0.0–4.0)

## 2014-08-05 ENCOUNTER — Other Ambulatory Visit: Payer: Self-pay | Admitting: Family Medicine

## 2014-08-05 MED ORDER — CANAGLIFLOZIN 100 MG PO TABS: 100.0000 mg | ORAL_TABLET | Freq: Every day | ORAL | Status: DC

## 2014-08-26 ENCOUNTER — Encounter: Payer: Self-pay | Admitting: *Deleted

## 2014-09-09 ENCOUNTER — Other Ambulatory Visit: Payer: Self-pay | Admitting: Family Medicine

## 2014-09-09 ENCOUNTER — Telehealth: Payer: Self-pay | Admitting: Family Medicine

## 2014-09-09 MED ORDER — CANAGLIFLOZIN 100 MG PO TABS: 100.0000 mg | ORAL_TABLET | Freq: Every day | ORAL | Status: DC

## 2014-09-09 NOTE — Telephone Encounter (Signed)
Left samples #28 at front desk Patient's wife notified.

## 2014-10-15 ENCOUNTER — Telehealth: Payer: Self-pay | Admitting: Family Medicine

## 2014-10-15 MED ORDER — CANAGLIFLOZIN 100 MG PO TABS: 100.0000 mg | ORAL_TABLET | Freq: Every day | ORAL | Status: DC

## 2014-10-15 NOTE — Telephone Encounter (Signed)
Aware, invokana samples ready.

## 2014-10-17 ENCOUNTER — Encounter: Payer: Self-pay | Admitting: *Deleted

## 2014-11-13 ENCOUNTER — Ambulatory Visit (INDEPENDENT_AMBULATORY_CARE_PROVIDER_SITE_OTHER): Payer: Commercial Managed Care - HMO | Admitting: Family Medicine

## 2014-11-13 ENCOUNTER — Encounter: Payer: Self-pay | Admitting: Family Medicine

## 2014-11-13 VITALS — BP 138/78 | HR 79 | Temp 98.1°F | Ht 67.0 in | Wt 182.0 lb

## 2014-11-13 DIAGNOSIS — E118 Type 2 diabetes mellitus with unspecified complications: Secondary | ICD-10-CM | POA: Diagnosis not present

## 2014-11-13 DIAGNOSIS — E119 Type 2 diabetes mellitus without complications: Secondary | ICD-10-CM

## 2014-11-13 DIAGNOSIS — I10 Essential (primary) hypertension: Secondary | ICD-10-CM

## 2014-11-13 LAB — POCT GLYCOSYLATED HEMOGLOBIN (HGB A1C): Hemoglobin A1C: 7.7

## 2014-11-13 LAB — POCT UA - MICROALBUMIN: Microalbumin Ur, POC: NEGATIVE mg/L

## 2014-11-13 MED ORDER — NYSTATIN-TRIAMCINOLONE 100000-0.1 UNIT/GM-% EX OINT: 1.0000 "application " | TOPICAL_OINTMENT | Freq: Two times a day (BID) | CUTANEOUS | Status: DC

## 2014-11-13 MED ORDER — GLIMEPIRIDE 4 MG PO TABS: 8.0000 mg | ORAL_TABLET | Freq: Every day | ORAL | Status: DC

## 2014-11-13 MED ORDER — PRAVASTATIN SODIUM 80 MG PO TABS: 80.0000 mg | ORAL_TABLET | Freq: Every day | ORAL | Status: DC

## 2014-11-13 MED ORDER — METFORMIN HCL 1000 MG PO TABS: 1000.0000 mg | ORAL_TABLET | Freq: Two times a day (BID) | ORAL | Status: DC

## 2014-11-13 MED ORDER — SITAGLIPTIN PHOSPHATE 100 MG PO TABS: 100.0000 mg | ORAL_TABLET | Freq: Every day | ORAL | Status: DC

## 2014-11-13 NOTE — Progress Notes (Signed)
Subjective:    Patient ID: George [name], male    DOB: 14-Feb-1946, 69 y.o.   MRN: [id number]  HPI 69 year old gentleman here to follow-up diabetes and hypertension and hyperlipidemia. When he checks sugars at home usually they're under 150 but occasionally range tired depending on diet. He has in the, but he also has a rash under the foreskin consistent with monilia and we think in the, may be contributing to that. He denies side effects from pravastatin 80 mg. Lipids were checked at his last visit in October they were not quite at goal but I do not want to push the pravastatin. We could potentially add some Zetia if they're still elevated at next check.  Patient Active Problem List   Diagnosis Date Noted  . Type 2 diabetes mellitus not at goal 06/17/2014  . Mixed hyperlipidemia due to type 2 diabetes mellitus 06/17/2014  . Essential hypertension, benign 06/17/2014  . Overweight (BMI 25.0-29.9) 06/17/2014   Outpatient Encounter Prescriptions as of 11/13/2014  Medication Sig  . aspirin 81 MG tablet Take 81 mg by mouth daily.  Marland Kitchen atorvastatin (LIPITOR) 80 MG tablet Take 1 tablet (80 mg total) by mouth daily.  . calcium carbonate (OS-CAL) 600 MG TABS Take 600 mg by mouth 2 (two) times daily with a meal.  . canagliflozin (INVOKANA) 100 MG TABS tablet Take 1 tablet (100 mg total) by mouth daily.  Marland Kitchen CINNAMON PO Take 1,000 mg by mouth 2 (two) times daily.  . fish oil-omega-3 fatty acids 1000 MG capsule Take 2 g by mouth daily.  Marland Kitchen glimepiride (AMARYL) 4 MG tablet Take 2 tablets (8 mg total) by mouth daily before breakfast.  . lisinopril (PRINIVIL,ZESTRIL) 10 MG tablet TAKE ONE TABLET BY MOUTH ONE TIME DAILY  . metFORMIN (GLUCOPHAGE) 1000 MG tablet TAKE ONE TABLET BY MOUTH TWICE A DAY WITH MEALS  . pravastatin (PRAVACHOL) 80 MG tablet      Review of Systems  Constitutional: Negative.   HENT: Negative.   Eyes: Negative.   Respiratory: Negative.  Negative for shortness of breath.     Cardiovascular: Negative.  Negative for chest pain and leg swelling.  Gastrointestinal: Negative.   Genitourinary: Negative.   Musculoskeletal: Negative.   Skin: Positive for rash.  Neurological: Negative.   Psychiatric/Behavioral: Negative.   All other systems reviewed and are negative.      Objective:   Physical Exam  Constitutional: He is oriented to person, place, and time. He appears well-developed.  HENT:  Head: Normocephalic.  Eyes: Pupils are equal, round, and reactive to light.  Cardiovascular: Normal rate.   Pulmonary/Chest: Effort normal.  Abdominal: Soft.  Musculoskeletal: Normal range of motion.  Neurological: He is alert and oriented to person, place, and time. He has normal reflexes.  Psychiatric: He has a normal mood and affect. His behavior is normal.    BP 138/78 mmHg  Pulse 79  Temp(Src) 98.1 F (36.7 C) (Oral)  Ht  (1.702 m)  Wt 182 lb (82.555 kg)  BMI 28.50 kg/m2        Assessment & Plan:  1. Type 2 diabetes mellitus not at goal Will stop in the, due to side effects and substitute Januvia 100 mg he is to call in one month if Januvia as tolerated - POCT glycosylated hemoglobin (Hb A1C) - POCT UA - Microalbumin  2. Essential hypertension, benign Continue with lisinopril blood pressure is controlled and there are no side effects  3. Type 2 diabetes mellitus with complication Hbut  I think once in the, stopped pressure cleare does have balanitis and I will call in Mycolog cream Frederica KusterStephen M Miller MD

## 2014-11-15 ENCOUNTER — Ambulatory Visit: Payer: Medicare HMO | Admitting: Family Medicine

## 2014-12-07 ENCOUNTER — Other Ambulatory Visit: Payer: Self-pay | Admitting: Family Medicine

## 2015-01-24 DIAGNOSIS — E784 Other hyperlipidemia: Secondary | ICD-10-CM | POA: Diagnosis not present

## 2015-01-24 DIAGNOSIS — H5210 Myopia, unspecified eye: Secondary | ICD-10-CM | POA: Diagnosis not present

## 2015-01-24 DIAGNOSIS — H5200 Hypermetropia, unspecified eye: Secondary | ICD-10-CM | POA: Diagnosis not present

## 2015-01-24 DIAGNOSIS — E119 Type 2 diabetes mellitus without complications: Secondary | ICD-10-CM | POA: Diagnosis not present

## 2015-01-24 DIAGNOSIS — H2510 Age-related nuclear cataract, unspecified eye: Secondary | ICD-10-CM | POA: Diagnosis not present

## 2015-01-24 DIAGNOSIS — H524 Presbyopia: Secondary | ICD-10-CM | POA: Diagnosis not present

## 2015-01-24 DIAGNOSIS — I10 Essential (primary) hypertension: Secondary | ICD-10-CM | POA: Diagnosis not present

## 2015-01-24 DIAGNOSIS — H52223 Regular astigmatism, bilateral: Secondary | ICD-10-CM | POA: Diagnosis not present

## 2015-01-24 DIAGNOSIS — E109 Type 1 diabetes mellitus without complications: Secondary | ICD-10-CM | POA: Diagnosis not present

## 2015-02-21 ENCOUNTER — Ambulatory Visit: Payer: Medicare HMO | Admitting: Family Medicine

## 2015-03-26 ENCOUNTER — Encounter: Payer: Self-pay | Admitting: Family Medicine

## 2015-03-26 ENCOUNTER — Encounter (INDEPENDENT_AMBULATORY_CARE_PROVIDER_SITE_OTHER): Payer: Self-pay

## 2015-03-26 ENCOUNTER — Ambulatory Visit (INDEPENDENT_AMBULATORY_CARE_PROVIDER_SITE_OTHER): Payer: Commercial Managed Care - HMO | Admitting: Family Medicine

## 2015-03-26 VITALS — BP 135/75 | HR 68 | Temp 98.0°F | Ht 67.0 in | Wt 180.0 lb

## 2015-03-26 DIAGNOSIS — E119 Type 2 diabetes mellitus without complications: Secondary | ICD-10-CM

## 2015-03-26 DIAGNOSIS — I10 Essential (primary) hypertension: Secondary | ICD-10-CM | POA: Diagnosis not present

## 2015-03-26 DIAGNOSIS — E1169 Type 2 diabetes mellitus with other specified complication: Secondary | ICD-10-CM | POA: Diagnosis not present

## 2015-03-26 DIAGNOSIS — E782 Mixed hyperlipidemia: Secondary | ICD-10-CM | POA: Diagnosis not present

## 2015-03-26 LAB — POCT GLYCOSYLATED HEMOGLOBIN (HGB A1C): Hemoglobin A1C: 9.2

## 2015-03-26 MED ORDER — SITAGLIPTIN PHOSPHATE 100 MG PO TABS: 100.0000 mg | ORAL_TABLET | Freq: Every day | ORAL | Status: DC

## 2015-03-26 NOTE — Addendum Note (Signed)
Addended by: Gwenith Daily on: 03/26/2015 03:28 PM   Modules accepted: Orders

## 2015-03-26 NOTE — Progress Notes (Signed)
   Subjective:    Patient ID: George [name], male    DOB: 11-06-1945, 69 y.o.   MRN: [id number]  HPI 69 year old with diabetes and hypertension. At his last visit I had started Januvia with samples but he ran out and did not follow-up. He felt like his sugars were better when he took the Januvia. Last A1c was 7.7 which what the best it's been in some time. He denies any new symptoms today. He continues to work full-time.  Patient Active Problem List   Diagnosis Date Noted  . Type 2 diabetes mellitus not at goal 06/17/2014  . Mixed hyperlipidemia due to type 2 diabetes mellitus 06/17/2014  . Essential hypertension, benign 06/17/2014  . Overweight (BMI 25.0-29.9) 06/17/2014   Outpatient Encounter Prescriptions as of 03/26/2015  Medication Sig  . aspirin 81 MG tablet Take 81 mg by mouth daily.  . calcium carbonate (OS-CAL) 600 MG TABS Take 600 mg by mouth 2 (two) times daily with a meal.  . CINNAMON PO Take 1,000 mg by mouth 2 (two) times daily.  . fish oil-omega-3 fatty acids 1000 MG capsule Take 2 g by mouth daily.  Marland Kitchen glimepiride (AMARYL) 4 MG tablet Take 2 tablets (8 mg total) by mouth daily before breakfast.  . lisinopril (PRINIVIL,ZESTRIL) 10 MG tablet TAKE ONE TABLET BY MOUTH ONE TIME DAILY  . metFORMIN (GLUCOPHAGE) 1000 MG tablet Take 1 tablet (1,000 mg total) by mouth 2 (two) times daily with a meal.  . nystatin-triamcinolone ointment (MYCOLOG) Apply 1 application topically 2 (two) times daily.  . pravastatin (PRAVACHOL) 80 MG tablet Take 1 tablet (80 mg total) by mouth daily.  . sitaGLIPtin (JANUVIA) 100 MG tablet Take 1 tablet (100 mg total) by mouth daily. (Patient not taking: Reported on 03/26/2015)   No facility-administered encounter medications on file as of 03/26/2015.      Review of Systems  Constitutional: Negative.   Respiratory: Negative.   Cardiovascular: Negative.   Genitourinary: Negative.   Neurological: Negative.   Psychiatric/Behavioral: Negative.          Objective:   Physical Exam  Constitutional: He is oriented to person, place, and time. He appears well-developed and well-nourished.  HENT:  Head: Normocephalic.  Cardiovascular: Normal rate and regular rhythm.   Pulmonary/Chest: Effort normal and breath sounds normal.  Neurological: He is alert and oriented to person, place, and time.  Psychiatric: He has a normal mood and affect.    BP 135/75 mmHg  Pulse 68  Temp(Src) 98 F (36.7 C) (Oral)  Ht 5\' 7"  (1.702 m)  Wt 180 lb (81.647 kg)  BMI 28.19 kg/m2       Assessment & Plan:  1. Mixed hyperlipidemia due to type 2 diabetes mellitus LDL not quite at goal on pravastatin  2. Type 2 diabetes mellitus not at goal Reinstitute Januvia and attempt to get A1c approaching goal - POCT glycosylated hemoglobin (Hb A1C)  3. Essential hypertension, benign Satisfactory level of blood pressure  Frederica Kuster MD

## 2015-04-21 ENCOUNTER — Ambulatory Visit (INDEPENDENT_AMBULATORY_CARE_PROVIDER_SITE_OTHER): Payer: Commercial Managed Care - HMO

## 2015-04-21 ENCOUNTER — Ambulatory Visit (INDEPENDENT_AMBULATORY_CARE_PROVIDER_SITE_OTHER): Payer: Commercial Managed Care - HMO | Admitting: Family Medicine

## 2015-04-21 ENCOUNTER — Encounter: Payer: Self-pay | Admitting: Family Medicine

## 2015-04-21 VITALS — BP 140/76 | HR 72 | Temp 97.0°F | Ht 67.0 in | Wt 181.0 lb

## 2015-04-21 DIAGNOSIS — M79662 Pain in left lower leg: Secondary | ICD-10-CM

## 2015-04-21 MED ORDER — IBUPROFEN 600 MG PO TABS: 600.0000 mg | ORAL_TABLET | Freq: Three times a day (TID) | ORAL | Status: DC | PRN
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
## 2015-04-22 ENCOUNTER — Encounter: Payer: Self-pay | Admitting: *Deleted

## 2015-04-23 ENCOUNTER — Ambulatory Visit (INDEPENDENT_AMBULATORY_CARE_PROVIDER_SITE_OTHER): Payer: Commercial Managed Care - HMO | Admitting: Physician Assistant

## 2015-04-23 ENCOUNTER — Encounter: Payer: Self-pay | Admitting: Physician Assistant

## 2015-04-23 VITALS — BP 134/78 | HR 97 | Ht 67.0 in | Wt 181.8 lb

## 2015-04-23 DIAGNOSIS — M79661 Pain in right lower leg: Secondary | ICD-10-CM | POA: Diagnosis not present
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
## 2015-05-02 ENCOUNTER — Telehealth: Payer: Self-pay | Admitting: Family Medicine

## 2015-05-03 MED ORDER — SITAGLIPTIN PHOSPHATE 100 MG PO TABS: 100.0000 mg | ORAL_TABLET | Freq: Every day | ORAL | Status: DC

## 2015-05-03 NOTE — Telephone Encounter (Signed)
Samples given.  

## 2015-05-16 ENCOUNTER — Emergency Department (HOSPITAL_COMMUNITY)
Admission: EM | Admit: 2015-05-16 | Discharge: 2015-05-16 | Disposition: A | Discharge status: Home / Self Care | Payer: Commercial Managed Care - HMO | Attending: Emergency Medicine | Admitting: Emergency Medicine

## 2015-05-16 ENCOUNTER — Encounter (HOSPITAL_COMMUNITY): Payer: Self-pay | Admitting: *Deleted

## 2015-05-16 DIAGNOSIS — Z79899 Other long term (current) drug therapy: Secondary | ICD-10-CM | POA: Diagnosis not present

## 2015-05-16 DIAGNOSIS — I10 Essential (primary) hypertension: Secondary | ICD-10-CM | POA: Insufficient documentation

## 2015-05-16 DIAGNOSIS — Z87891 Personal history of nicotine dependence: Secondary | ICD-10-CM | POA: Insufficient documentation

## 2015-05-16 DIAGNOSIS — E119 Type 2 diabetes mellitus without complications: Secondary | ICD-10-CM | POA: Insufficient documentation

## 2015-05-16 DIAGNOSIS — H9190 Unspecified hearing loss, unspecified ear: Secondary | ICD-10-CM | POA: Insufficient documentation

## 2015-05-16 DIAGNOSIS — Z0389 Encounter for observation for other suspected diseases and conditions ruled out: Secondary | ICD-10-CM | POA: Diagnosis not present

## 2015-05-16 DIAGNOSIS — T148 Other injury of unspecified body region: Secondary | ICD-10-CM | POA: Diagnosis not present

## 2015-05-16 DIAGNOSIS — Z7982 Long term (current) use of aspirin: Secondary | ICD-10-CM | POA: Insufficient documentation

## 2015-05-16 DIAGNOSIS — Z791 Long term (current) use of non-steroidal anti-inflammatories (NSAID): Secondary | ICD-10-CM | POA: Insufficient documentation

## 2015-05-16 DIAGNOSIS — S59911A Unspecified injury of right forearm, initial encounter: Secondary | ICD-10-CM | POA: Diagnosis not present

## 2015-05-16 DIAGNOSIS — E780 Pure hypercholesterolemia: Secondary | ICD-10-CM | POA: Diagnosis not present

## 2015-05-16 DIAGNOSIS — W19XXXA Unspecified fall, initial encounter: Secondary | ICD-10-CM

## 2015-05-16 DIAGNOSIS — Z043 Encounter for examination and observation following other accident: Secondary | ICD-10-CM | POA: Diagnosis not present

## 2015-05-16 DIAGNOSIS — E785 Hyperlipidemia, unspecified: Secondary | ICD-10-CM | POA: Diagnosis not present

## 2015-05-16 LAB — BASIC METABOLIC PANEL
ANION GAP: 9 (ref 5–15)
BUN: 18 mg/dL (ref 6–20)
CALCIUM: 9.1 mg/dL (ref 8.9–10.3)
CO2: 26 mmol/L (ref 22–32)
Chloride: 103 mmol/L (ref 101–111)
Creatinine, Ser: 0.76 mg/dL (ref 0.61–1.24)
GFR calc non Af Amer: >60 mL/min (ref >60)
Glucose, Bld: 216 mg/dL — ABNORMAL HIGH (ref 65–99)
Potassium: 3.9 mmol/L (ref 3.5–5.1)
Sodium: 138 mmol/L (ref 135–145)

## 2015-05-16 LAB — CBC WITH DIFFERENTIAL/PLATELET
Basophils Absolute: 0 10*3/uL (ref 0.0–0.1)
Basophils Relative: 0 % (ref 0–1)
EOS ABS: 0.1 10*3/uL (ref 0.0–0.7)
EOS PCT: 1 % (ref 0–5)
HEMATOCRIT: 40.6 % (ref 39.0–52.0)
HEMOGLOBIN: 13.6 g/dL (ref 13.0–17.0)
LYMPHS ABS: 1.4 10*3/uL (ref 0.7–4.0)
LYMPHS PCT: 18 % (ref 12–46)
MCH: 28.5 pg (ref 26.0–34.0)
MCHC: 33.5 g/dL (ref 30.0–36.0)
MCV: 85.1 fL (ref 78.0–100.0)
MONOS PCT: 5 % (ref 3–12)
Monocytes Absolute: 0.4 10*3/uL (ref 0.1–1.0)
Neutro Abs: 5.9 10*3/uL (ref 1.7–7.7)
Neutrophils Relative %: 76 % (ref 43–77)
Platelets: 178 10*3/uL (ref 150–400)
RBC: 4.77 MIL/uL (ref 4.22–5.81)
RDW: 13.1 % (ref 11.5–15.5)
WBC: 7.8 10*3/uL (ref 4.0–10.5)

## 2015-05-16 MED ORDER — SODIUM CHLORIDE 0.9 % IV BOLUS (SEPSIS)
1000.0000 mL | Freq: Once | INTRAVENOUS | Status: AC
  Administered 2015-05-16: 1000 mL via INTRAVENOUS
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
## 2015-05-16 NOTE — ED Notes (Signed)
MD Cook at bedside updating patient and family.  

## 2015-05-16 NOTE — Discharge Instructions (Signed)
Glucose was 216. Otherwise test were good. Increase fluids. Follow-up your primary care doctor
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
## 2015-05-30 ENCOUNTER — Other Ambulatory Visit: Payer: Self-pay

## 2015-05-30 ENCOUNTER — Telehealth: Payer: Self-pay | Admitting: Family Medicine

## 2015-05-30 MED ORDER — SITAGLIPTIN PHOSPHATE 100 MG PO TABS: 100.0000 mg | ORAL_TABLET | Freq: Every day | ORAL | Status: DC

## 2015-05-30 NOTE — Telephone Encounter (Signed)
Patient informed and samples placed at front desk, #28

## 2015-06-01 ENCOUNTER — Emergency Department (HOSPITAL_COMMUNITY): Payer: Commercial Managed Care - HMO

## 2015-06-01 ENCOUNTER — Emergency Department (HOSPITAL_COMMUNITY)
Admission: EM | Admit: 2015-06-01 | Discharge: 2015-06-01 | Disposition: A | Discharge status: Home / Self Care | Payer: Commercial Managed Care - HMO | Attending: Emergency Medicine | Admitting: Emergency Medicine

## 2015-06-01 ENCOUNTER — Encounter (HOSPITAL_COMMUNITY): Payer: Self-pay | Admitting: *Deleted

## 2015-06-01 DIAGNOSIS — Z87891 Personal history of nicotine dependence: Secondary | ICD-10-CM | POA: Insufficient documentation

## 2015-06-01 DIAGNOSIS — Z23 Encounter for immunization: Secondary | ICD-10-CM | POA: Diagnosis not present

## 2015-06-01 DIAGNOSIS — S9032XA Contusion of left foot, initial encounter: Secondary | ICD-10-CM | POA: Insufficient documentation

## 2015-06-01 DIAGNOSIS — R918 Other nonspecific abnormal finding of lung field: Secondary | ICD-10-CM | POA: Diagnosis not present

## 2015-06-01 DIAGNOSIS — S50812A Abrasion of left forearm, initial encounter: Secondary | ICD-10-CM | POA: Diagnosis not present

## 2015-06-01 DIAGNOSIS — M79675 Pain in left toe(s): Secondary | ICD-10-CM | POA: Diagnosis not present

## 2015-06-01 DIAGNOSIS — S0990XA Unspecified injury of head, initial encounter: Secondary | ICD-10-CM | POA: Diagnosis not present

## 2015-06-01 DIAGNOSIS — Y998 Other external cause status: Secondary | ICD-10-CM | POA: Diagnosis not present

## 2015-06-01 DIAGNOSIS — H9190 Unspecified hearing loss, unspecified ear: Secondary | ICD-10-CM | POA: Insufficient documentation

## 2015-06-01 DIAGNOSIS — Y9289 Other specified places as the place of occurrence of the external cause: Secondary | ICD-10-CM | POA: Insufficient documentation

## 2015-06-01 DIAGNOSIS — Z791 Long term (current) use of non-steroidal anti-inflammatories (NSAID): Secondary | ICD-10-CM | POA: Insufficient documentation

## 2015-06-01 DIAGNOSIS — S20211A Contusion of right front wall of thorax, initial encounter: Secondary | ICD-10-CM | POA: Insufficient documentation

## 2015-06-01 DIAGNOSIS — E119 Type 2 diabetes mellitus without complications: Secondary | ICD-10-CM | POA: Insufficient documentation

## 2015-06-01 DIAGNOSIS — I10 Essential (primary) hypertension: Secondary | ICD-10-CM | POA: Diagnosis not present

## 2015-06-01 DIAGNOSIS — Z7982 Long term (current) use of aspirin: Secondary | ICD-10-CM | POA: Diagnosis not present

## 2015-06-01 DIAGNOSIS — S299XXA Unspecified injury of thorax, initial encounter: Secondary | ICD-10-CM | POA: Diagnosis not present

## 2015-06-01 DIAGNOSIS — R9389 Abnormal findings on diagnostic imaging of other specified body structures: Secondary | ICD-10-CM

## 2015-06-01 DIAGNOSIS — M47812 Spondylosis without myelopathy or radiculopathy, cervical region: Secondary | ICD-10-CM | POA: Diagnosis not present

## 2015-06-01 DIAGNOSIS — S0181XA Laceration without foreign body of other part of head, initial encounter: Secondary | ICD-10-CM | POA: Diagnosis not present

## 2015-06-01 DIAGNOSIS — E780 Pure hypercholesterolemia: Secondary | ICD-10-CM | POA: Diagnosis not present

## 2015-06-01 DIAGNOSIS — J984 Other disorders of lung: Secondary | ICD-10-CM | POA: Diagnosis not present

## 2015-06-01 DIAGNOSIS — S01111A Laceration without foreign body of right eyelid and periocular area, initial encounter: Secondary | ICD-10-CM | POA: Diagnosis not present

## 2015-06-01 DIAGNOSIS — Y9389 Activity, other specified: Secondary | ICD-10-CM | POA: Insufficient documentation

## 2015-06-01 DIAGNOSIS — Z79899 Other long term (current) drug therapy: Secondary | ICD-10-CM | POA: Diagnosis not present

## 2015-06-01 DIAGNOSIS — E785 Hyperlipidemia, unspecified: Secondary | ICD-10-CM | POA: Insufficient documentation

## 2015-06-01 DIAGNOSIS — S199XXA Unspecified injury of neck, initial encounter: Secondary | ICD-10-CM | POA: Diagnosis not present

## 2015-06-01 DIAGNOSIS — W108XXA Fall (on) (from) other stairs and steps, initial encounter: Secondary | ICD-10-CM | POA: Diagnosis not present

## 2015-06-01 DIAGNOSIS — S99922A Unspecified injury of left foot, initial encounter: Secondary | ICD-10-CM | POA: Diagnosis not present

## 2015-06-01 DIAGNOSIS — M19072 Primary osteoarthritis, left ankle and foot: Secondary | ICD-10-CM | POA: Diagnosis not present

## 2015-06-01 DIAGNOSIS — S0531XA Ocular laceration without prolapse or loss of intraocular tissue, right eye, initial encounter: Secondary | ICD-10-CM | POA: Diagnosis not present

## 2015-06-01 MED ORDER — LIDOCAINE-EPINEPHRINE 2 %-1:100000 IJ SOLN
20.0000 mL | Freq: Once | INTRAMUSCULAR | Status: AC
  Administered 2015-06-01: 20 mL
  Filled 2015-06-01: qty 20

## 2015-06-01 MED ORDER — LIDOCAINE HCL (PF) 2 % IJ SOLN
INTRAMUSCULAR | Status: AC
  Filled 2015-06-01: qty 10

## 2015-06-01 MED ORDER — TETANUS-DIPHTH-ACELL PERTUSSIS 5-2.5-18.5 LF-MCG/0.5 IM SUSP
0.5000 mL | Freq: Once | INTRAMUSCULAR | Status: AC
  Administered 2015-06-01: 0.5 mL via INTRAMUSCULAR
  Filled 2015-06-01: qty 0.5

## 2015-06-01 NOTE — Discharge Instructions (Signed)
You have had a head injury which does not appear to require admission at this time. A concussion is a state of changed mental ability from trauma.  SEEK IMMEDIATE MEDICAL ATTENTION IF: There is confusion or drowsiness (although children frequently become drowsy after injury).  You cannot awaken the injured person.  There is nausea (feeling sick to your stomach) or continued, forceful vomiting.  You notice dizziness or unsteadiness which is getting worse, or inability to walk.  You have convulsions or unconsciousness.  You experience severe, persistent headaches not relieved by Tylenol. (Do not take aspirin as this impairs clotting abilities). Take other pain medications only as directed.  You cannot use arms or legs normally.  There are changes in pupil sizes. (This is the black center in the colored part of the eye)  There is clear or bloody discharge from the nose or ears.  Change in speech, vision, swallowing, or understanding.  Localized weakness, numbness, tingling, or change in bowel or bladder control.  PLEASE HAVE REPEAT CHEST XRAY IN ONE MONTH AS WE DISCUSSED

## 2015-06-01 NOTE — ED Provider Notes (Signed)
LACERATION REPAIR Performed by: Joya Gaskins Consent: Verbal consent obtained. Risks and benefits: risks, benefits and alternatives were discussed Patient identity confirmed: provided demographic data Time out performed prior to procedure Prepped and Draped in normal sterile fashion Wound explored Laceration Location: right eyebrow Laceration Length: 3cm No Foreign Bodies seen or palpated Anesthesia: local infiltration Local anesthetic: lidocaine 2% without epinephrine Anesthetic total: 4 ml Amount of cleaning: standard Skin closure: complex Number of sutures or staples: 3 plain gut Technique: complex - interrupted Patient tolerance: Patient tolerated the procedure well with no immediate complications.   Zadie Rhine, MD 06/01/15 [phone]

## 2015-06-01 NOTE — ED Provider Notes (Signed)
PT HAS NO FOCAL TENDERNESS ON LEFT FOOT AS INDICATED BY XRAY HE HAS NO OTHER COMPLAINTS ADVISED REPEAT CXR IN ONE MONTH BY PCP TO DETERMINE IF NIPPLE SHADOW ON CXR STABLE FOR DC HOME  Zadie Rhine, MD 06/01/15 [phone]
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
## 2015-06-01 NOTE — ED Notes (Signed)
telfa applied to suture site. telfa secured in place with paper tape.  Pt tolerated well.nad noted. Pt educated on s/s of infection. Pt verbalized understanding.
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
## 2015-06-13 ENCOUNTER — Other Ambulatory Visit: Payer: Self-pay | Admitting: Family Medicine

## 2015-07-02 ENCOUNTER — Encounter: Payer: Self-pay | Admitting: Family Medicine

## 2015-07-02 ENCOUNTER — Ambulatory Visit (INDEPENDENT_AMBULATORY_CARE_PROVIDER_SITE_OTHER): Payer: Commercial Managed Care - HMO

## 2015-07-02 ENCOUNTER — Ambulatory Visit (INDEPENDENT_AMBULATORY_CARE_PROVIDER_SITE_OTHER): Payer: Commercial Managed Care - HMO | Admitting: Family Medicine

## 2015-07-02 VITALS — BP 150/77 | HR 69 | Temp 97.2°F | Ht 68.0 in | Wt 184.0 lb

## 2015-07-02 DIAGNOSIS — E1169 Type 2 diabetes mellitus with other specified complication: Secondary | ICD-10-CM | POA: Diagnosis not present

## 2015-07-02 DIAGNOSIS — R911 Solitary pulmonary nodule: Secondary | ICD-10-CM

## 2015-07-02 DIAGNOSIS — E119 Type 2 diabetes mellitus without complications: Secondary | ICD-10-CM | POA: Diagnosis not present

## 2015-07-02 DIAGNOSIS — I10 Essential (primary) hypertension: Secondary | ICD-10-CM | POA: Diagnosis not present

## 2015-07-02 DIAGNOSIS — E782 Mixed hyperlipidemia: Secondary | ICD-10-CM

## 2015-07-02 LAB — POCT GLYCOSYLATED HEMOGLOBIN (HGB A1C): Hemoglobin A1C: 8

## 2015-07-02 NOTE — Progress Notes (Signed)
Subjective:    Patient ID: George [name], male    DOB: 11/26/1945, 69 y.o.   MRN: [id number]  HPI Pt here for follow up and management of chronic medical problems which includes hypertension, hyperlipidemia, and diabetes. He is taking medications regularly.  He has had some recent falls but they all happened for a reason rather than random way. During the course of one of those falls he was seen in the emergency room at [facility] and had an abnormal chest x-ray. Abnormality may have been nipple but they told him to follow-up with another chest x-ray to determine if there were any "spots." Regarding diabetes his last A1c was 9.2 in June. Current medications include omeprazole and metformin and Januvia. He denies any pain or burning or numbness in his feet and legs.      Patient Active Problem List   Diagnosis Date Noted  . Type 2 diabetes mellitus not at goal 06/17/2014  . Mixed hyperlipidemia due to type 2 diabetes mellitus 06/17/2014  . Essential hypertension, benign 06/17/2014  . Overweight (BMI 25.0-29.9) 06/17/2014   Outpatient Encounter Prescriptions as of 07/02/2015  Medication Sig  . aspirin 81 MG tablet Take 81 mg by mouth daily.  . calcium carbonate (OS-CAL) 600 MG TABS Take 600 mg by mouth 2 (two) times daily with a meal.  . CINNAMON PO Take 1,000 mg by mouth 2 (two) times daily.  . fish oil-omega-3 fatty acids 1000 MG capsule Take 2 g by mouth daily.  Marland Kitchen glimepiride (AMARYL) 4 MG tablet TAKE 2 TABLETS (8 MG TOTAL) BY MOUTH DAILY BEFORE BREAKFAST  . lisinopril (PRINIVIL,ZESTRIL) 10 MG tablet TAKE ONE TABLET BY MOUTH ONE TIME DAILY  . metFORMIN (GLUCOPHAGE) 1000 MG tablet TAKE ONE TABLET BY MOUTH TWICE A DAY WITH MEALS  . naproxen sodium (ANAPROX) 220 MG tablet Take 220 mg by mouth 2 (two) times daily with a meal.  . pravastatin (PRAVACHOL) 80 MG tablet Take 1 tablet (80 mg total) by mouth daily.  . sitaGLIPtin (JANUVIA) 100 MG tablet Take 1 tablet (100 mg total) by mouth  daily.  . [DISCONTINUED] glimepiride (AMARYL) 4 MG tablet Take 2 tablets (8 mg total) by mouth daily before breakfast.  . [DISCONTINUED] ibuprofen (ADVIL,MOTRIN) 600 MG tablet Take 1 tablet (600 mg total) by mouth every 8 (eight) hours as needed. (Patient not taking: Reported on 05/16/2015)  . [DISCONTINUED] lisinopril (PRINIVIL,ZESTRIL) 10 MG tablet TAKE ONE TABLET BY MOUTH ONE TIME DAILY  . [DISCONTINUED] metFORMIN (GLUCOPHAGE) 1000 MG tablet Take 1 tablet (1,000 mg total) by mouth 2 (two) times daily with a meal.  . [DISCONTINUED] nystatin-triamcinolone ointment (MYCOLOG) Apply 1 application topically 2 (two) times daily. (Patient not taking: Reported on 05/16/2015)  . [DISCONTINUED] sitaGLIPtin (JANUVIA) 100 MG tablet Take 1 tablet (100 mg total) by mouth daily. (Patient not taking: Reported on 03/26/2015)   No facility-administered encounter medications on file as of 07/02/2015.     Review of Systems     Objective:   Physical Exam  Constitutional: He is oriented to person, place, and time. He appears well-developed and well-nourished.  Cardiovascular: Normal rate and regular rhythm.   Pulmonary/Chest: Effort normal and breath sounds normal.  Musculoskeletal: Normal range of motion.  Neurological: He is alert and oriented to person, place, and time.  Psychiatric: He has a normal mood and affect. Thought content normal.   BP 150/77 mmHg  Pulse 69  Temp(Src) 97.2 F (36.2 C) (Oral)  Ht '5\' 8"'  (1.727 m)  Wt 184 lb (83.462 kg)  BMI 27.98 kg/m2        Assessment & Plan:  1. Mixed hyperlipidemia due to type 2 diabetes mellitus Last lipids were not at goal we will repeat today - Lipid panel  2. Type 2 diabetes mellitus not at goal Last A1c not at goal. We talked about importance of diet today I think may need to adjust medicine if A1c is not improved - POCT glycosylated hemoglobin (Hb A1C)  3. Essential hypertension, benign Blood pressure today 150/77. That is too high. Last  visit was 1 3473 which would be a good goal. - CMP14+EGFR  4. Lung nodule As stated above lung nodule may or may not be real hopefully today's x-ray will help sort that out - DG Chest 2 View; Future  Wardell Honour MD

## 2015-07-02 NOTE — Patient Instructions (Addendum)
Medicare Annual Wellness Visit  [facility] and the medical providers at [facility] strive to bring you the best medical care.  In doing so we not only want to address your current medical conditions and concerns but also to detect new conditions early and prevent illness, disease and health-related problems.    Medicare offers a yearly Wellness Visit which allows our clinical staff to assess your need for preventative services including immunizations, lifestyle education, counseling to decrease risk of preventable diseases and screening for fall risk and other medical concerns.    This visit is provided free of charge (no copay) for all Medicare recipients. The clinical pharmacists at [facility] have begun to conduct these Wellness Visits which will also include a thorough review of all your medications.    As you primary medical provider recommend that you make an appointment for your Annual Wellness Visit if you have not done so already this year.  You may set up this appointment before you leave today or you may call back ([phone]) and schedule an appointment.  Please make sure when you call that you mention that you are scheduling your Annual Wellness Visit with the clinical pharmacist so that the appointment may be made for the proper length of time.     Continue current medications. Continue good therapeutic lifestyle changes which include good diet and exercise. Fall precautions discussed with patient. If an FOBT was given today- please return it to our front desk. If you are over 50 years old - you may need Prevnar 13 or the adult Pneumonia vaccine.  **Flu shots will be available soon--- please call and schedule a FLU-CLINIC appointment**  After your visit with us today you will receive a survey in the mail or online from [employer] regarding your care with us. Please take a moment to fill this out. Your feedback is  very important to us as you can help us better understand your patient needs as well as improve your experience and satisfaction. WE CARE ABOUT YOU!!!    

## 2015-07-03 LAB — CMP14+EGFR
ALT: 30 IU/L (ref 0–44)
AST: 27 IU/L (ref 0–40)
Albumin/Globulin Ratio: 2.2 (ref 1.1–2.5)
Albumin: 4.6 g/dL (ref 3.5–4.8)
Alkaline Phosphatase: 59 IU/L (ref 39–117)
BUN/Creatinine Ratio: 16 (ref 10–22)
BUN: 14 mg/dL (ref 8–27)
Bilirubin Total: 0.3 mg/dL (ref 0.0–1.2)
CALCIUM: 10 mg/dL (ref 8.6–10.2)
CO2: 24 mmol/L (ref 18–29)
CREATININE: 0.88 mg/dL (ref 0.76–1.27)
Chloride: 96 mmol/L — ABNORMAL LOW (ref 97–108)
GFR calc Af Amer: 101 mL/min/{1.73_m2} (ref >59)
GFR, EST NON AFRICAN AMERICAN: 87 mL/min/{1.73_m2} (ref >59)
Globulin, Total: 2.1 g/dL (ref 1.5–4.5)
Glucose: 153 mg/dL — ABNORMAL HIGH (ref 65–99)
POTASSIUM: 4.4 mmol/L (ref 3.5–5.2)
Sodium: 138 mmol/L (ref 134–144)
Total Protein: 6.7 g/dL (ref 6.0–8.5)

## 2015-07-03 LAB — LIPID PANEL
Chol/HDL Ratio: 3.9 ratio units (ref 0.0–5.0)
Cholesterol, Total: 200 mg/dL — ABNORMAL HIGH (ref 100–199)
HDL: 51 mg/dL (ref >39)
LDL Calculated: 124 mg/dL — ABNORMAL HIGH (ref 0–99)
Triglycerides: 126 mg/dL (ref 0–149)
VLDL Cholesterol Cal: 25 mg/dL (ref 5–40)

## 2015-07-14 ENCOUNTER — Other Ambulatory Visit: Payer: Self-pay | Admitting: Family Medicine

## 2015-08-09 ENCOUNTER — Telehealth: Payer: Self-pay | Admitting: Family Medicine

## 2015-08-11 NOTE — Telephone Encounter (Signed)
Stp's wife and advised we don't have any samples. Offered to send in rx to pharmacy but wife states it was expensive and he is on Medicare and wouldn't be able to get it but would CB to check on samples tomorrow.

## 2015-09-18 ENCOUNTER — Other Ambulatory Visit: Payer: Self-pay | Admitting: Family Medicine

## 2015-09-19 ENCOUNTER — Other Ambulatory Visit: Payer: Self-pay | Admitting: Family Medicine

## 2015-09-22 ENCOUNTER — Ambulatory Visit (INDEPENDENT_AMBULATORY_CARE_PROVIDER_SITE_OTHER): Payer: Worker's Compensation | Admitting: Family Medicine

## 2015-09-22 ENCOUNTER — Encounter: Payer: Self-pay | Admitting: Family Medicine

## 2015-09-22 VITALS — BP 154/86 | HR 81 | Temp 98.0°F | Ht 68.0 in | Wt 185.0 lb

## 2015-09-22 DIAGNOSIS — S61315A Laceration without foreign body of left ring finger with damage to nail, initial encounter: Secondary | ICD-10-CM

## 2015-09-22 DIAGNOSIS — S61219A Laceration without foreign body of unspecified finger without damage to nail, initial encounter: Secondary | ICD-10-CM

## 2015-09-22 NOTE — Progress Notes (Signed)
   HPI  Patient presents today here with finger laceration at work.  On December 19, around 11 AM the patient cut his finger working on a tool at work. He was cutting metal.  He had significant bleeding which was controlled by the time he was arrived at the clinic.  His pain is controlled currently. He denies fever, chills, sweats.  PMH: Smoking status noted ROS: Per HPI  Objective: BP 154/86 mmHg  Pulse 81  Temp(Src) 98 F (36.7 C) (Oral)  Ht 5\' 8"  (1.727 m)  Wt 185 lb (83.915 kg)  BMI 28.14 kg/m2 Gen: NAD, alert, cooperative with exam HEENT: NCATaly Ext: No edema, warm Neuro: Alert and oriented, No gross deficits  Skin: L third finger with 2 cm laceration extending just medial to the nail plate and then extending into th enail plate into th ebed below leaving approx 3-4 mm of nail intact keeping he distal nail attatched.  Bleeding controlled Cleaned with saline and iodine by LPN.  approx 1-2 mm deep   Assessment and plan:  # Finger laceration Offered removal of distal nail portion for better wound closure but patient declines Cleaned and thin layer of mupirocin applied approximated with steri strips Thin layer of mupirocin applied Recommended placing finger in a splint while at work, I have limited him to no more than 10 pounds lifting or pulling/fishing with his left hand. He has a follow-up scheduled already in one week with his PCP   Murtis SinkSam Bobette Leyh, MD [facility] 09/22/2015, 2:26 PM

## 2015-10-01 ENCOUNTER — Encounter: Payer: Self-pay | Admitting: Family Medicine

## 2015-10-01 ENCOUNTER — Ambulatory Visit (INDEPENDENT_AMBULATORY_CARE_PROVIDER_SITE_OTHER): Payer: Commercial Managed Care - HMO | Admitting: Family Medicine

## 2015-10-01 VITALS — BP 159/79 | HR 74 | Temp 97.0°F | Ht 68.0 in | Wt 181.8 lb

## 2015-10-01 DIAGNOSIS — E119 Type 2 diabetes mellitus without complications: Secondary | ICD-10-CM

## 2015-10-01 DIAGNOSIS — I10 Essential (primary) hypertension: Secondary | ICD-10-CM | POA: Diagnosis not present

## 2015-10-01 LAB — POCT GLYCOSYLATED HEMOGLOBIN (HGB A1C): Hemoglobin A1C: 8.8

## 2015-10-01 MED ORDER — SITAGLIPTIN PHOSPHATE 100 MG PO TABS: 100.0000 mg | ORAL_TABLET | Freq: Every day | ORAL | Status: DC

## 2015-10-01 MED ORDER — EMPAGLIFLOZIN 10 MG PO TABS: 10.0000 mg | ORAL_TABLET | Freq: Every day | ORAL | Status: DC

## 2015-10-01 NOTE — Progress Notes (Signed)
   Subjective:    Patient ID: George [name], male    DOB: 04-06-1946, 69 y.o.   MRN: [id number]  HPI 69 year old gentleman with diabetes hyperlipidemia and hypertension. Control of his sugars as well as blood pressure has not been optimal. He has not been able to afford the Januvia recently and today that hemoglobin A1c has increased to 8.8 from 8.03 months ago. Lipids were checked in September LDL was not quite at goal either 124 minutes on 80 mg of Pravachol. He denies any chest pain or lower extremity symptoms although there is some slight burning on the bottoms of his feet but this is not really problematic at this point.  Patient Active Problem List   Diagnosis Date Noted  . Type 2 diabetes mellitus not at goal [facility](HCC) 06/17/2014  . Mixed hyperlipidemia due to type 2 diabetes mellitus (HCC) 06/17/2014  . Essential hypertension, benign 06/17/2014  . Overweight (BMI 25.0-29.9) 06/17/2014   Outpatient Encounter Prescriptions as of 10/01/2015  Medication Sig  . aspirin 81 MG tablet Take 81 mg by mouth daily.  . calcium carbonate (OS-CAL) 600 MG TABS Take 600 mg by mouth 2 (two) times daily with a meal.  . CINNAMON PO Take 1,000 mg by mouth 2 (two) times daily.  . fish oil-omega-3 fatty acids 1000 MG capsule Take 2 g by mouth daily.  Marland Kitchen. glimepiride (AMARYL) 4 MG tablet TAKE 2 TABLETS (8 MG TOTAL) BY MOUTH DAILY BEFORE BREAKFAST  . lisinopril (PRINIVIL,ZESTRIL) 10 MG tablet TAKE ONE TABLET BY MOUTH ONE TIME DAILY  . metFORMIN (GLUCOPHAGE) 1000 MG tablet TAKE ONE TABLET BY MOUTH TWICE A DAY WITH MEALS  . naproxen sodium (ANAPROX) 220 MG tablet Take 220 mg by mouth 2 (two) times daily with a meal.  . pravastatin (PRAVACHOL) 80 MG tablet TAKE ONE TABLET BY MOUTH ONE TIME DAILY  . sitaGLIPtin (JANUVIA) 100 MG tablet Take 1 tablet (100 mg total) by mouth daily.   No facility-administered encounter medications on file as of 10/01/2015.      Review of Systems  Constitutional: Negative.     HENT: Negative.   Respiratory: Negative.   Cardiovascular: Negative.   Neurological: Negative.   Psychiatric/Behavioral: Negative.        Objective:   Physical Exam  Constitutional: He is oriented to person, place, and time. He appears well-developed and well-nourished.  Cardiovascular: Normal rate and regular rhythm.   Pulmonary/Chest: Effort normal and breath sounds normal.  Neurological: He is alert and oriented to person, place, and time.  Psychiatric: He has a normal mood and affect.          Assessment & Plan:  1. Type 2 diabetes mellitus not at goal [facility](HCC) Noted in history of present illness, hemoglobin A1c has gone up since he has been off Januvia plan to switch [place]. Co-pay will be about the same as Januvia I would like to see what this new class of diabetes medicines will do for his sugars. I suspect it will be more effective.  - POCT glycosylated hemoglobin (Hb A1C)   2. Essential hypertension, benign Since blood pressure control is not optimal today's visit and last time will increase lisinopril from 10-20 mg and follow  George KusterStephen M Miko Sirico MD

## 2015-10-07 ENCOUNTER — Other Ambulatory Visit: Payer: Self-pay

## 2015-10-07 ENCOUNTER — Encounter: Payer: Self-pay | Admitting: *Deleted

## 2015-10-07 MED ORDER — METFORMIN HCL 1000 MG PO TABS: 1000.0000 mg | ORAL_TABLET | Freq: Two times a day (BID) | ORAL | Status: DC

## 2015-11-07 ENCOUNTER — Telehealth: Payer: Self-pay | Admitting: Family Medicine

## 2015-11-07 MED ORDER — EMPAGLIFLOZIN 10 MG PO TABS: 10.0000 mg | ORAL_TABLET | Freq: Every day | ORAL | Status: DC

## 2015-11-07 NOTE — Telephone Encounter (Signed)
Pt aware samples were left up front.

## 2015-12-06 ENCOUNTER — Telehealth: Payer: Self-pay | Admitting: Family Medicine

## 2015-12-06 MED ORDER — EMPAGLIFLOZIN 10 MG PO TABS: 10.0000 mg | ORAL_TABLET | Freq: Every day | ORAL | Status: DC

## 2015-12-06 NOTE — Addendum Note (Signed)
Addended by: Fawn KirkHOLT, CATHY on: 12/06/2015 10:13 AM   Modules accepted: Orders

## 2015-12-06 NOTE — Telephone Encounter (Signed)
TC to wife, we do not have samples at this time. Refill sent to pharmacy for #30, has appt set for 4/4 w/ Dr. Hyacinth MeekerMiller

## 2015-12-08 ENCOUNTER — Other Ambulatory Visit: Payer: Self-pay | Admitting: *Deleted

## 2015-12-08 MED ORDER — EMPAGLIFLOZIN 10 MG PO TABS: 10.0000 mg | ORAL_TABLET | Freq: Every day | ORAL | Status: DC

## 2015-12-27 ENCOUNTER — Other Ambulatory Visit: Payer: Self-pay | Admitting: Family Medicine

## 2015-12-27 ENCOUNTER — Telehealth: Payer: Self-pay | Admitting: Family Medicine

## 2015-12-29 NOTE — Telephone Encounter (Signed)
Done earlier

## 2016-01-05 ENCOUNTER — Ambulatory Visit: Payer: Self-pay | Admitting: Family Medicine

## 2016-01-06 ENCOUNTER — Ambulatory Visit (INDEPENDENT_AMBULATORY_CARE_PROVIDER_SITE_OTHER): Payer: Commercial Managed Care - HMO | Admitting: Family Medicine

## 2016-01-06 ENCOUNTER — Encounter: Payer: Self-pay | Admitting: Family Medicine

## 2016-01-06 VITALS — BP 132/68 | HR 57 | Temp 96.9°F | Ht 68.0 in | Wt 178.0 lb

## 2016-01-06 DIAGNOSIS — I10 Essential (primary) hypertension: Secondary | ICD-10-CM

## 2016-01-06 DIAGNOSIS — E782 Mixed hyperlipidemia: Secondary | ICD-10-CM

## 2016-01-06 DIAGNOSIS — E1169 Type 2 diabetes mellitus with other specified complication: Secondary | ICD-10-CM | POA: Diagnosis not present

## 2016-01-06 DIAGNOSIS — E119 Type 2 diabetes mellitus without complications: Secondary | ICD-10-CM

## 2016-01-06 LAB — BAYER DCA HB A1C WAIVED: HB A1C: 7.9 % — AB (ref <7.0)

## 2016-01-06 MED ORDER — LISINOPRIL 10 MG PO TABS: 10.0000 mg | ORAL_TABLET | Freq: Every day | ORAL | Status: DC

## 2016-01-06 MED ORDER — METFORMIN HCL 1000 MG PO TABS: 1000.0000 mg | ORAL_TABLET | Freq: Two times a day (BID) | ORAL | Status: DC

## 2016-01-06 MED ORDER — EMPAGLIFLOZIN 10 MG PO TABS: 10.0000 mg | ORAL_TABLET | Freq: Every day | ORAL | Status: DC

## 2016-01-06 MED ORDER — PRAVASTATIN SODIUM 80 MG PO TABS: 80.0000 mg | ORAL_TABLET | Freq: Every day | ORAL | Status: DC

## 2016-01-06 MED ORDER — LISINOPRIL 20 MG PO TABS: 10.0000 mg | ORAL_TABLET | Freq: Every day | ORAL | Status: DC

## 2016-01-06 MED ORDER — GLIMEPIRIDE 4 MG PO TABS: ORAL_TABLET | ORAL | Status: DC

## 2016-01-06 NOTE — Progress Notes (Signed)
   Subjective:    Patient ID: George [name], male    DOB: 23-Apr-1946, 70 y.o.   MRN: [id number]  HPI Pt here for follow up and management of chronic medical problems which includes hypertension and diabetes. He is talking medications regularly.  At his last visit I increased lisinopril from to 20 mg and blood pressure is now normal. He also started Jardiance and sugars have been generally less than 150 when he checks fasting. He denies any other symptoms, specifically chest pain lower extremity burning or tingling. He denies any urinary symptoms with the Jardiance.      Patient Active Problem List   Diagnosis Date Noted  . Type 2 diabetes mellitus not at goal [facility]) 06/17/2014  . Mixed hyperlipidemia due to type 2 diabetes mellitus ([place]) 06/17/2014  . Essential hypertension, benign 06/17/2014  . Overweight (BMI 25.0-29.9) 06/17/2014   Outpatient Encounter Prescriptions as of 01/06/2016  Medication Sig  . aspirin 81 MG tablet Take 81 mg by mouth daily.  . calcium carbonate (OS-CAL) 600 MG TABS Take 600 mg by mouth 2 (two) times daily with a meal.  . CINNAMON PO Take 1,000 mg by mouth 2 (two) times daily.  . empagliflozin (JARDIANCE) 10 MG TABS tablet Take 10 mg by mouth daily.  . fish oil-omega-3 fatty acids 1000 MG capsule Take 2 g by mouth daily.  Marland Kitchen glimepiride (AMARYL) 4 MG tablet TAKE 2 TABLETS (8 MG TOTAL) BY MOUTH DAILY BEFORE BREAKFAST  . lisinopril (PRINIVIL,ZESTRIL) 10 MG tablet TAKE ONE TABLET BY MOUTH ONE TIME DAILY  . metFORMIN (GLUCOPHAGE) 1000 MG tablet Take 1 tablet (1,000 mg total) by mouth 2 (two) times daily with a meal.  . naproxen sodium (ANAPROX) 220 MG tablet Take 220 mg by mouth 2 (two) times daily with a meal.  . pravastatin (PRAVACHOL) 80 MG tablet TAKE ONE TABLET BY MOUTH ONE TIME DAILY   No facility-administered encounter medications on file as of 01/06/2016.     Review of Systems  Constitutional: Negative.   HENT: Negative.   Eyes: Negative.     Respiratory: Negative.   Cardiovascular: Negative.   Gastrointestinal: Negative.   Endocrine: Negative.   Genitourinary: Negative.   Musculoskeletal: Negative.   Skin: Negative.   Allergic/Immunologic: Negative.   Neurological: Negative.   Hematological: Negative.   Psychiatric/Behavioral: Negative.        Objective:   Physical Exam  Constitutional: He is oriented to person, place, and time. He appears well-developed and well-nourished.  Cardiovascular: Normal rate and normal heart sounds.   Pulmonary/Chest: Effort normal and breath sounds normal.  Neurological: He is alert and oriented to person, place, and time.  Psychiatric: He has a normal mood and affect. His behavior is normal.   BP 132/68 mmHg  Pulse 57  Temp(Src) 96.9 F (36.1 C) (Oral)  Ht '5\' 8"'$  (1.727 m)  Wt 178 lb (80.74 kg)  BMI 27.07 kg/m2        Assessment & Plan:  1. Type 2 diabetes mellitus not at goal [facility]) Now on 3 drug regimen and sugars have improved. Expect to see improvement in A1c which was 8.83 months ago - Bayer DCA Hb A1c Waived - Microalbumin / creatinine urine ratio  2. Essential hypertension, benign Blood pressure has improved on 20 mg lisinopril - CMP14+EGFR  3. Mixed hyperlipidemia due to type 2 diabetes mellitus [facility]) Patient continues with pravastatin. Last LDL was 124 which is not at goal. - Lipid panel  Wardell Honour MD

## 2016-01-06 NOTE — Patient Instructions (Signed)
Medicare Annual Wellness Visit  George [name] and the medical providers at [facility] strive to bring you the best medical care.  In doing so we not only want to address your current medical conditions and concerns but also to detect new conditions early and prevent illness, disease and health-related problems.    Medicare offers a yearly Wellness Visit which allows our clinical staff to assess your need for preventative services including immunizations, lifestyle education, counseling to decrease risk of preventable diseases and screening for fall risk and other medical concerns.    This visit is provided free of charge (no copay) for all Medicare recipients. The clinical pharmacists at [facility] have begun to conduct these Wellness Visits which will also include a thorough review of all your medications.    As you primary medical provider recommend that you make an appointment for your Annual Wellness Visit if you have not done so already this year.  You may set up this appointment before you leave today or you may call back ([phone]) and schedule an appointment.  Please make sure when you call that you mention that you are scheduling your Annual Wellness Visit with the clinical pharmacist so that the appointment may be made for the proper length of time.     Continue current medications. Continue good therapeutic lifestyle changes which include good diet and exercise. Fall precautions discussed with patient. If an FOBT was given today- please return it to our front desk. If you are over 50 years old - you may need Prevnar 13 or the adult Pneumonia vaccine.  **Flu shots are available--- please call and schedule a FLU-CLINIC appointment**  After your visit with us today you will receive a survey in the mail or online from [employer] regarding your care with us. Please take a moment to fill this out. Your feedback is very  important to us as you can help us better understand your patient needs as well as improve your experience and satisfaction. WE CARE ABOUT YOU!!!    

## 2016-01-06 NOTE — Addendum Note (Signed)
Addended by: Magdalene RiverBULLINS, Maki Hege H on: 01/06/2016 08:45 AM   Modules accepted: Orders

## 2016-01-07 LAB — LIPID PANEL
CHOLESTEROL TOTAL: 200 mg/dL — AB (ref 100–199)
Chol/HDL Ratio: 4.4 ratio units (ref 0.0–5.0)
HDL: 45 mg/dL (ref >39)
LDL Calculated: 139 mg/dL — ABNORMAL HIGH (ref 0–99)
TRIGLYCERIDES: 81 mg/dL (ref 0–149)
VLDL Cholesterol Cal: 16 mg/dL (ref 5–40)

## 2016-01-07 LAB — CMP14+EGFR
A/G RATIO: 1.8 (ref 1.2–2.2)
ALBUMIN: 4.2 g/dL (ref 3.5–4.8)
ALK PHOS: 55 IU/L (ref 39–117)
ALT: 26 IU/L (ref 0–44)
AST: 18 IU/L (ref 0–40)
BILIRUBIN TOTAL: 0.3 mg/dL (ref 0.0–1.2)
BUN / CREAT RATIO: 25 — AB (ref 10–24)
BUN: 18 mg/dL (ref 8–27)
CHLORIDE: 99 mmol/L (ref 96–106)
CO2: 23 mmol/L (ref 18–29)
CREATININE: 0.73 mg/dL — AB (ref 0.76–1.27)
Calcium: 9.1 mg/dL (ref 8.6–10.2)
GFR calc Af Amer: 108 mL/min/{1.73_m2} (ref >59)
GFR calc non Af Amer: 93 mL/min/{1.73_m2} (ref >59)
GLOBULIN, TOTAL: 2.3 g/dL (ref 1.5–4.5)
Glucose: 126 mg/dL — ABNORMAL HIGH (ref 65–99)
POTASSIUM: 4.4 mmol/L (ref 3.5–5.2)
SODIUM: 140 mmol/L (ref 134–144)
Total Protein: 6.5 g/dL (ref 6.0–8.5)

## 2016-01-07 LAB — MICROALBUMIN / CREATININE URINE RATIO
CREATININE, UR: 27.9 mg/dL
Microalbumin, Urine: <3 ug/mL

## 2016-02-27 DIAGNOSIS — H11042 Peripheral pterygium, stationary, left eye: Secondary | ICD-10-CM | POA: Diagnosis not present

## 2016-02-27 DIAGNOSIS — H2513 Age-related nuclear cataract, bilateral: Secondary | ICD-10-CM | POA: Diagnosis not present

## 2016-02-27 DIAGNOSIS — H5203 Hypermetropia, bilateral: Secondary | ICD-10-CM | POA: Diagnosis not present

## 2016-02-27 DIAGNOSIS — H11153 Pinguecula, bilateral: Secondary | ICD-10-CM | POA: Diagnosis not present

## 2016-02-27 LAB — HM DIABETES EYE EXAM

## 2016-04-09 ENCOUNTER — Encounter: Payer: Self-pay | Admitting: Family Medicine

## 2016-04-09 ENCOUNTER — Ambulatory Visit (INDEPENDENT_AMBULATORY_CARE_PROVIDER_SITE_OTHER): Payer: Commercial Managed Care - HMO | Admitting: Family Medicine

## 2016-04-09 VITALS — BP 127/71 | HR 71 | Temp 97.6°F | Ht 68.0 in | Wt 174.0 lb

## 2016-04-09 DIAGNOSIS — E782 Mixed hyperlipidemia: Secondary | ICD-10-CM

## 2016-04-09 DIAGNOSIS — E1169 Type 2 diabetes mellitus with other specified complication: Secondary | ICD-10-CM

## 2016-04-09 DIAGNOSIS — I10 Essential (primary) hypertension: Secondary | ICD-10-CM

## 2016-04-09 DIAGNOSIS — E119 Type 2 diabetes mellitus without complications: Secondary | ICD-10-CM

## 2016-04-09 LAB — BAYER DCA HB A1C WAIVED: HB A1C: 7.9 % — AB (ref <7.0)

## 2016-04-09 MED ORDER — ROSUVASTATIN CALCIUM 20 MG PO TABS: 20.0000 mg | ORAL_TABLET | Freq: Every day | ORAL | Status: DC

## 2016-04-09 NOTE — Patient Instructions (Signed)
Medicare Annual Wellness Visit  [place] and the medical providers at [facility] strive to bring you the best medical care.  In doing so we not only want to address your current medical conditions and concerns but also to detect new conditions early and prevent illness, disease and health-related problems.    Medicare offers a yearly Wellness Visit which allows our clinical staff to assess your need for preventative services including immunizations, lifestyle education, counseling to decrease risk of preventable diseases and screening for fall risk and other medical concerns.    This visit is provided free of charge (no copay) for all Medicare recipients. The clinical pharmacists at [facility] have begun to conduct these Wellness Visits which will also include a thorough review of all your medications.    As you primary medical provider recommend that you make an appointment for your Annual Wellness Visit if you have not done so already this year.  You may set up this appointment before you leave today or you may call back ([phone]) and schedule an appointment.  Please make sure when you call that you mention that you are scheduling your Annual Wellness Visit with the clinical pharmacist so that the appointment may be made for the proper length of time.     Continue current medications. Continue good therapeutic lifestyle changes which include good diet and exercise. Fall precautions discussed with patient. If an FOBT was given today- please return it to our front desk. If you are over 50 years old - you may need Prevnar 13 or the adult Pneumonia vaccine.  After your visit with us today you will receive a survey in the mail or online from [employer]aney regarding your care with us. Please take a moment to fill this out. Your feedback is very important to us as you can help us better understand your patient needs as well as improve  your experience and satisfaction. WE CARE ABOUT YOU!!!    

## 2016-04-09 NOTE — Progress Notes (Signed)
   Subjective:    Patient ID: George [name], male    DOB: [birth year], 70 y.o.   MRN: [id number]  HPI Pt here for follow up and management of chronic medical problems which includes diabetes. He is taking medications regularly.  Testing his sugars at home generally less than 120 fasting but his last A1c was 7.9. This likely means post prandial spiking. Blood pressure is good. He can afford his diabetes medicines so far. Lipids were not quite at goal we checked 3 months ago with LDL at 139 on 80 mg of pravastatin. I think we should probably try a high high potency statin to reach her goal in the lipid department.     Patient Active Problem List   Diagnosis Date Noted  . Type 2 diabetes mellitus not at goal [facility](HCC) 06/17/2014  . Mixed hyperlipidemia due to type 2 diabetes mellitus (HCC) 06/17/2014  . Essential hypertension, benign 06/17/2014  . Overweight (BMI 25.0-29.9) 06/17/2014   Outpatient Encounter Prescriptions as of 04/09/2016  Medication Sig  . aspirin 81 MG tablet Take 81 mg by mouth daily.  . calcium carbonate (OS-CAL) 600 MG TABS Take 600 mg by mouth 2 (two) times daily with a meal.  . CINNAMON PO Take 1,000 mg by mouth 2 (two) times daily.  . empagliflozin (JARDIANCE) 10 MG TABS tablet Take 10 mg by mouth daily.  . fish oil-omega-3 fatty acids 1000 MG capsule Take 2 g by mouth daily.  Marland Kitchen. glimepiride (AMARYL) 4 MG tablet TAKE 2 TABLETS (8 MG TOTAL) BY MOUTH DAILY BEFORE BREAKFAST  . lisinopril (PRINIVIL,ZESTRIL) 20 MG tablet Take 0.5 tablets (10 mg total) by mouth daily.  . metFORMIN (GLUCOPHAGE) 1000 MG tablet Take 1 tablet (1,000 mg total) by mouth 2 (two) times daily with a meal.  . naproxen sodium (ANAPROX) 220 MG tablet Take 220 mg by mouth 2 (two) times daily with a meal.  . pravastatin (PRAVACHOL) 80 MG tablet Take 1 tablet (80 mg total) by mouth daily.   No facility-administered encounter medications on file as of 04/09/2016.      Review of Systems  Constitutional:  Negative.   HENT: Negative.   Eyes: Negative.   Respiratory: Negative.   Cardiovascular: Negative.   Gastrointestinal: Negative.   Endocrine: Negative.   Genitourinary: Negative.   Musculoskeletal: Negative.   Skin: Negative.   Allergic/Immunologic: Negative.   Neurological: Negative.   Hematological: Negative.   Psychiatric/Behavioral: Negative.        Objective:   Physical Exam  Constitutional: He appears well-developed and well-nourished.  Cardiovascular: Normal rate, regular rhythm and normal heart sounds.   Pulmonary/Chest: Effort normal and breath sounds normal.  Abdominal: Soft.  Neurological: He is alert.   BP 127/71 mmHg  Pulse 71  Temp(Src) 97.6 F (36.4 C) (Oral)  Ht 5\' 8"  (1.727 m)  Wt 174 lb (78.926 kg)  BMI 26.46 kg/m2        Assessment & Plan:  1. Type 2 diabetes mellitus not at goal [facility](HCC) History suggests diabetes is doing well but A1c needs to confirm - Bayer DCA Hb A1c Waived  2. Essential hypertension, benign Blood pressure is well controlled on current regimen of lisinopril  3. Mixed hyperlipidemia due to type 2 diabetes mellitus (HCC) Lipids need better control we will switch to a high potency statin after he finishes current prescription and recheck 2 months after  Frederica KusterStephen M Miller MD

## 2016-08-13 ENCOUNTER — Ambulatory Visit (INDEPENDENT_AMBULATORY_CARE_PROVIDER_SITE_OTHER): Payer: Commercial Managed Care - HMO | Admitting: Family Medicine

## 2016-08-13 ENCOUNTER — Encounter: Payer: Self-pay | Admitting: Family Medicine

## 2016-08-13 VITALS — BP 136/74 | HR 66 | Temp 96.8°F | Ht 68.0 in | Wt 177.0 lb

## 2016-08-13 DIAGNOSIS — I10 Essential (primary) hypertension: Secondary | ICD-10-CM

## 2016-08-13 DIAGNOSIS — Z23 Encounter for immunization: Secondary | ICD-10-CM

## 2016-08-13 DIAGNOSIS — E782 Mixed hyperlipidemia: Secondary | ICD-10-CM | POA: Diagnosis not present

## 2016-08-13 DIAGNOSIS — E119 Type 2 diabetes mellitus without complications: Secondary | ICD-10-CM

## 2016-08-13 DIAGNOSIS — E1169 Type 2 diabetes mellitus with other specified complication: Secondary | ICD-10-CM

## 2016-08-13 LAB — BAYER DCA HB A1C WAIVED: HB A1C: 8.4 % — AB (ref <7.0)

## 2016-08-13 NOTE — Patient Instructions (Signed)
Medicare Annual Wellness Visit  [place] and the medical providers at [facility] strive to bring you the best medical care.  In doing so we not only want to address your current medical conditions and concerns but also to detect new conditions early and prevent illness, disease and health-related problems.    Medicare offers a yearly Wellness Visit which allows our clinical staff to assess your need for preventative services including immunizations, lifestyle education, counseling to decrease risk of preventable diseases and screening for fall risk and other medical concerns.    This visit is provided free of charge (no copay) for all Medicare recipients. The clinical pharmacists at [facility] have begun to conduct these Wellness Visits which will also include a thorough review of all your medications.    As you primary medical provider recommend that you make an appointment for your Annual Wellness Visit if you have not done so already this year.  You may set up this appointment before you leave today or you may call back [phone]) and schedule an appointment.  Please make sure when you call that you mention that you are scheduling your Annual Wellness Visit with the clinical pharmacist so that the appointment may be made for the proper length of time.     Continue current medications. Continue good therapeutic lifestyle changes which include good diet and exercise. Fall precautions discussed with patient. If an FOBT was given today- please return it to our front desk. If you are over 50 years old - you may need Prevnar 13 or the adult Pneumonia vaccine.  **Flu shots are available--- please call and schedule a FLU-CLINIC appointment**  After your visit with us today you will receive a survey in the mail or online from [employer] Ganey regarding your care with us. Please take a moment to fill this out. Your feedback is very  important to us as you can help us better understand your patient needs as well as improve your experience and satisfaction. WE CARE ABOUT YOU!!!    

## 2016-08-13 NOTE — Progress Notes (Signed)
Subjective:    Patient ID: George [name], male    DOB: March 21, 1946, 70 y.o.   MRN: [id number]  HPI Pt here for follow up and management of chronic medical problems which includes diabetes, hypertension and hyperlipidemia. He is taking medications regularly. There is an issue with affordability of his Jardiance. Otherwise he has no complaints. Last A1c was 7.9.    Patient Active Problem List   Diagnosis Date Noted  . Type 2 diabetes mellitus not at goal [facility]) 06/17/2014  . Mixed hyperlipidemia due to type 2 diabetes mellitus ([place]) 06/17/2014  . Essential hypertension, benign 06/17/2014  . Overweight (BMI 25.0-29.9) 06/17/2014   Outpatient Encounter Prescriptions as of 08/13/2016  Medication Sig  . aspirin 81 MG tablet Take 81 mg by mouth daily.  . calcium carbonate (OS-CAL) 600 MG TABS Take 600 mg by mouth 2 (two) times daily with a meal.  . CINNAMON PO Take 1,000 mg by mouth 2 (two) times daily.  . empagliflozin (JARDIANCE) 10 MG TABS tablet Take 10 mg by mouth daily.  . fish oil-omega-3 fatty acids 1000 MG capsule Take 2 g by mouth daily.  Marland Kitchen glimepiride (AMARYL) 4 MG tablet TAKE 2 TABLETS (8 MG TOTAL) BY MOUTH DAILY BEFORE BREAKFAST  . lisinopril (PRINIVIL,ZESTRIL) 20 MG tablet Take 0.5 tablets (10 mg total) by mouth daily.  . metFORMIN (GLUCOPHAGE) 1000 MG tablet Take 1 tablet (1,000 mg total) by mouth 2 (two) times daily with a meal.  . rosuvastatin (CRESTOR) 20 MG tablet Take 1 tablet (20 mg total) by mouth daily.  . [DISCONTINUED] naproxen sodium (ANAPROX) 220 MG tablet Take 220 mg by mouth 2 (two) times daily with a meal.  . [DISCONTINUED] pravastatin (PRAVACHOL) 80 MG tablet Take 1 tablet (80 mg total) by mouth daily.   No facility-administered encounter medications on file as of 08/13/2016.       Review of Systems  Constitutional: Negative.   HENT: Negative.   Eyes: Negative.   Respiratory: Negative.   Cardiovascular: Negative.   Gastrointestinal: Negative.     Endocrine: Negative.   Genitourinary: Negative.   Musculoskeletal: Negative.   Skin: Negative.   Allergic/Immunologic: Negative.   Neurological: Negative.   Hematological: Negative.   Psychiatric/Behavioral: Negative.        Objective:   Physical Exam  Constitutional: He is oriented to person, place, and time. He appears well-developed and well-nourished.  Cardiovascular: Normal rate, regular rhythm and normal heart sounds.   Pulmonary/Chest: Effort normal and breath sounds normal.  Neurological: He is alert and oriented to person, place, and time.  Psychiatric: He has a normal mood and affect. His behavior is normal.   BP 136/74 (BP Location: Left Arm)   Pulse 66   Temp (!) 96.8 F (36 C) (Oral)   Ht '5\' 8"'  (1.727 m)   Wt 177 lb (80.3 kg)   BMI 26.91 kg/m         Assessment & Plan:  1. Type 2 diabetes mellitus not at goal [facility]) We'll check to see if there is a preferred drug on his insurance if not samples of Jardiance and if not that he will just have to afford Jardiance as best he can - Bayer DCA Hb A1c Waived  2. Essential  benign Blood pressures are well controlled on current regimen - CMP14+EGFR  3. Mixed hyperlipidemia due to type 2 diabetes mellitus ([place]) He is taking pravastatin rather than rosuvastatin but is on 80 mg. He tolerates this well  Wardell Honour  MD - Lipid panel

## 2016-08-14 LAB — CMP14+EGFR
A/G RATIO: 1.7 (ref 1.2–2.2)
ALT: 23 IU/L (ref 0–44)
AST: 21 IU/L (ref 0–40)
Albumin: 4.5 g/dL (ref 3.5–4.8)
Alkaline Phosphatase: 66 IU/L (ref 39–117)
BILIRUBIN TOTAL: 0.4 mg/dL (ref 0.0–1.2)
BUN/Creatinine Ratio: 26 — ABNORMAL HIGH (ref 10–24)
BUN: 21 mg/dL (ref 8–27)
CALCIUM: 9.8 mg/dL (ref 8.6–10.2)
CHLORIDE: 96 mmol/L (ref 96–106)
CO2: 26 mmol/L (ref 18–29)
Creatinine, Ser: 0.81 mg/dL (ref 0.76–1.27)
GFR calc Af Amer: 103 mL/min/{1.73_m2} (ref >59)
GFR calc non Af Amer: 89 mL/min/{1.73_m2} (ref >59)
GLUCOSE: 120 mg/dL — AB (ref 65–99)
Globulin, Total: 2.7 g/dL (ref 1.5–4.5)
POTASSIUM: 4.9 mmol/L (ref 3.5–5.2)
Sodium: 138 mmol/L (ref 134–144)
Total Protein: 7.2 g/dL (ref 6.0–8.5)

## 2016-08-14 LAB — LIPID PANEL
Chol/HDL Ratio: 4.8 ratio units (ref 0.0–5.0)
Cholesterol, Total: 205 mg/dL — ABNORMAL HIGH (ref 100–199)
HDL: 43 mg/dL (ref >39)
LDL Calculated: 140 mg/dL — ABNORMAL HIGH (ref 0–99)
TRIGLYCERIDES: 110 mg/dL (ref 0–149)
VLDL CHOLESTEROL CAL: 22 mg/dL (ref 5–40)

## 2016-09-15 ENCOUNTER — Other Ambulatory Visit: Payer: Self-pay | Admitting: Family Medicine

## 2016-09-15 NOTE — Telephone Encounter (Signed)
Attempted to contact patient.  No answer and no voicemail. 

## 2016-10-06 ENCOUNTER — Telehealth: Payer: Self-pay | Admitting: Family Medicine

## 2016-10-07 NOTE — Telephone Encounter (Signed)
Called [employer] to find out about Jardiance coverage.  George PepperJardiance is a tier 3 medications which would be $47/month copay. However patient has a $195 deductible to meet at the beginning of 2018 which is why his first fill of jardiance would cost $242 ($47 + $195).

## 2016-10-07 NOTE — Telephone Encounter (Signed)
He is on maximal dose of glimepiride as well as metformin. Can we see if his insurance will cover another drug in the class Jardiance; if not Jardiance, may be [place] or [place]

## 2016-10-07 NOTE — Telephone Encounter (Signed)
Send to Tammy please

## 2016-10-07 NOTE — Telephone Encounter (Signed)
Aware.  No jardiance samples.  Patient wants to start different medication.  This medicine has gone up to over $100 co-pay a month.  Please send in new script.

## 2016-10-07 NOTE — Telephone Encounter (Signed)
Advised to send to clinical pharm D for medication review.

## 2016-10-07 NOTE — Telephone Encounter (Signed)
George DeistFarxiga most expensive, Invokana same price   As Jardiance.   What can you suggest to do since cost of Jardiance is too expensive.

## 2016-10-08 NOTE — Telephone Encounter (Signed)
Spoke with patient's wife. She states that they can afford cost of $47 per month fr Jardiance.  We will try to supplement with 2 weeks of samples and patient will buy 2 weeks of jardiance to ease him through the deductible phase.   I asked about 2017 income and it is above the income cut off of LIS / extra assistance for Medicare but if he reaches coverage gap later in 2018 we can apply for assistance from drug company.

## 2016-10-29 ENCOUNTER — Telehealth: Payer: Self-pay | Admitting: Family Medicine

## 2016-12-14 ENCOUNTER — Telehealth: Payer: Self-pay | Admitting: Family Medicine

## 2016-12-14 MED ORDER — EMPAGLIFLOZIN 10 MG PO TABS: 10.0000 mg | ORAL_TABLET | Freq: Every day | ORAL | 0 refills | Status: DC

## 2016-12-14 NOTE — Telephone Encounter (Signed)
Pt aware samples at front desk 

## 2016-12-17 ENCOUNTER — Ambulatory Visit (INDEPENDENT_AMBULATORY_CARE_PROVIDER_SITE_OTHER): Payer: Medicare HMO | Admitting: Family Medicine

## 2016-12-17 ENCOUNTER — Encounter: Payer: Self-pay | Admitting: Family Medicine

## 2016-12-17 VITALS — BP 134/63 | HR 61 | Temp 97.8°F | Ht 68.0 in | Wt 178.0 lb

## 2016-12-17 DIAGNOSIS — I10 Essential (primary) hypertension: Secondary | ICD-10-CM

## 2016-12-17 DIAGNOSIS — E782 Mixed hyperlipidemia: Secondary | ICD-10-CM

## 2016-12-17 DIAGNOSIS — E119 Type 2 diabetes mellitus without complications: Secondary | ICD-10-CM | POA: Diagnosis not present

## 2016-12-17 DIAGNOSIS — E1169 Type 2 diabetes mellitus with other specified complication: Secondary | ICD-10-CM

## 2016-12-17 LAB — BAYER DCA HB A1C WAIVED: HB A1C: 7.6 % — AB (ref <7.0)

## 2016-12-17 NOTE — Progress Notes (Signed)
   Subjective:    Patient ID: George [name], male    DOB: [birth year], 71 y.o.   MRN: [id number]  HPI at last visit there was issue with affordability of his diabetes medicines. So far he has been able to get samples here in the office. His last A1c was 8.4. Main concern today is decreased hearing especially in left ear he denies any problems with chest pain and shortness of breath or neuropathy-like symptoms. Pt here for follow up and management of chronic medical problems which includes hyperlipidemia, diabetes, and hypertension. He is taking medication regularly.     Patient Active Problem List   Diagnosis Date Noted  . Type 2 diabetes mellitus not at goal [facility]) 06/17/2014  . Mixed hyperlipidemia due to type 2 diabetes mellitus (HCC) 06/17/2014  . Essential hypertension, benign 06/17/2014  . Overweight (BMI 25.0-29.9) 06/17/2014   Outpatient Encounter Prescriptions as of 12/17/2016  Medication Sig  . aspirin 81 MG tablet Take 81 mg by mouth daily.  . calcium carbonate (OS-CAL) 600 MG TABS Take 600 mg by mouth 2 (two) times daily with a meal.  . CINNAMON PO Take 1,000 mg by mouth 2 (two) times daily.  . empagliflozin (JARDIANCE) 10 MG TABS tablet Take 10 mg by mouth daily.  . fish oil-omega-3 fatty acids 1000 MG capsule Take 2 g by mouth daily.  Marland Kitchen glimepiride (AMARYL) 4 MG tablet TAKE 2 TABLETS (8 MG TOTAL) BY MOUTH DAILY BEFORE BREAKFAST  . lisinopril (PRINIVIL,ZESTRIL) 20 MG tablet Take 0.5 tablets (10 mg total) by mouth daily.  . metFORMIN (GLUCOPHAGE) 1000 MG tablet Take 1 tablet (1,000 mg total) by mouth 2 (two) times daily with a meal.  . rosuvastatin (CRESTOR) 20 MG tablet Take 1 tablet (20 mg total) by mouth daily.   No facility-administered encounter medications on file as of 12/17/2016.      Review of Systems  Constitutional: Negative.   HENT: Negative.        Decreased hearing   Eyes: Negative.   Respiratory: Negative.   Cardiovascular: Negative.   Gastrointestinal:  Negative.   Endocrine: Negative.   Genitourinary: Negative.   Musculoskeletal: Negative.   Skin: Negative.   Allergic/Immunologic: Negative.   Neurological: Negative.   Hematological: Negative.   Psychiatric/Behavioral: Negative.        Objective:   Physical Exam  Constitutional: He is oriented to person, place, and time. He appears well-developed and well-nourished.  Cardiovascular: Normal rate, regular rhythm and normal heart sounds.   Pulmonary/Chest: Effort normal and breath sounds normal.  Neurological: He is alert and oriented to person, place, and time.    BP 134/63 (BP Location: Right Arm)   Pulse 61   Temp 97.8 F (36.6 C) (Oral)   Ht  (1.727 m)   Wt 178 lb (80.7 kg)   BMI 27.06 kg/m        Assessment & Plan:  1. Type 2 diabetes mellitus not at goal [facility]) Sugars at home have been okay - Bayer DCA Hb A1c Waived  2. Essential hypertension, benign Pressure well controlled at 134/63  3. Mixed hyperlipidemia due to type 2 diabetes mellitus (HCC) Patient has come back from Crestor to pravastatin. LDL not as well controlled. May need to be on more potent statin  Frederica Kuster MD

## 2016-12-17 NOTE — Patient Instructions (Signed)
Medicare Annual Wellness Visit  [place] and the medical providers at [facility] strive to bring you the best medical care.  In doing so we not only want to address your current medical conditions and concerns but also to detect new conditions early and prevent illness, disease and health-related problems.    Medicare offers a yearly Wellness Visit which allows our clinical staff to assess your need for preventative services including immunizations, lifestyle education, counseling to decrease risk of preventable diseases and screening for fall risk and other medical concerns.    This visit is provided free of charge (no copay) for all Medicare recipients. The clinical pharmacists at [facility] have begun to conduct these Wellness Visits which will also include a thorough review of all your medications.    As you primary medical provider recommend that you make an appointment for your Annual Wellness Visit if you have not done so already this year.  You may set up this appointment before you leave today or you may call back ([phone]) and schedule an appointment.  Please make sure when you call that you mention that you are scheduling your Annual Wellness Visit with the clinical pharmacist so that the appointment may be made for the proper length of time.     Continue current medications. Continue good therapeutic lifestyle changes which include good diet and exercise. Fall precautions discussed with patient. If an FOBT was given today- please return it to our front desk. If you are over 50 years old - you may need Prevnar 13 or the adult Pneumonia vaccine.  **Flu shots are available--- please call and schedule a FLU-CLINIC appointment**  After your visit with us today you will receive a survey in the mail or online from [employer] regarding your care with us. Please take a moment to fill this out. Your feedback is very  important to us as you can help us better understand your patient needs as well as improve your experience and satisfaction. WE CARE ABOUT YOU!!!    

## 2016-12-31 ENCOUNTER — Other Ambulatory Visit: Payer: Self-pay | Admitting: Family Medicine

## 2017-01-13 ENCOUNTER — Other Ambulatory Visit: Payer: Self-pay | Admitting: Family Medicine

## 2017-01-14 ENCOUNTER — Telehealth: Payer: Self-pay | Admitting: Family Medicine

## 2017-01-17 NOTE — Telephone Encounter (Signed)
Samples ready for pick up

## 2017-02-19 ENCOUNTER — Other Ambulatory Visit: Payer: Self-pay | Admitting: Family Medicine

## 2017-03-11 DIAGNOSIS — Z7984 Long term (current) use of oral hypoglycemic drugs: Secondary | ICD-10-CM | POA: Diagnosis not present

## 2017-03-11 DIAGNOSIS — H2513 Age-related nuclear cataract, bilateral: Secondary | ICD-10-CM | POA: Diagnosis not present

## 2017-03-11 DIAGNOSIS — H11153 Pinguecula, bilateral: Secondary | ICD-10-CM | POA: Diagnosis not present

## 2017-03-11 DIAGNOSIS — E113293 Type 2 diabetes mellitus with mild nonproliferative diabetic retinopathy without macular edema, bilateral: Secondary | ICD-10-CM | POA: Diagnosis not present

## 2017-03-11 LAB — HM DIABETES EYE EXAM

## 2017-03-21 ENCOUNTER — Other Ambulatory Visit: Payer: Self-pay | Admitting: Family Medicine

## 2017-03-25 ENCOUNTER — Encounter: Payer: Self-pay | Admitting: Family Medicine

## 2017-03-25 ENCOUNTER — Ambulatory Visit (INDEPENDENT_AMBULATORY_CARE_PROVIDER_SITE_OTHER): Payer: Medicare HMO | Admitting: Family Medicine

## 2017-03-25 VITALS — BP 132/72 | HR 67 | Temp 97.2°F | Ht 68.0 in | Wt 172.4 lb

## 2017-03-25 DIAGNOSIS — E119 Type 2 diabetes mellitus without complications: Secondary | ICD-10-CM | POA: Diagnosis not present

## 2017-03-25 DIAGNOSIS — E663 Overweight: Secondary | ICD-10-CM

## 2017-03-25 DIAGNOSIS — E782 Mixed hyperlipidemia: Secondary | ICD-10-CM | POA: Diagnosis not present

## 2017-03-25 DIAGNOSIS — E11319 Type 2 diabetes mellitus with unspecified diabetic retinopathy without macular edema: Secondary | ICD-10-CM | POA: Insufficient documentation

## 2017-03-25 DIAGNOSIS — I10 Essential (primary) hypertension: Secondary | ICD-10-CM

## 2017-03-25 DIAGNOSIS — E1169 Type 2 diabetes mellitus with other specified complication: Secondary | ICD-10-CM | POA: Diagnosis not present

## 2017-03-25 LAB — BAYER DCA HB A1C WAIVED: HB A1C (BAYER DCA - WAIVED): 7.4 % — ABNORMAL HIGH (ref <7.0)

## 2017-03-25 MED ORDER — ROSUVASTATIN CALCIUM 20 MG PO TABS: 20.0000 mg | ORAL_TABLET | Freq: Every day | ORAL | 3 refills | Status: DC

## 2017-03-25 NOTE — Progress Notes (Signed)
BP 132/72   Pulse 67   Temp 97.2 F (36.2 C) (Oral)   Ht _0  (1.727 m)   Wt 172 lb 6.4 oz (78.2 kg)   BMI 26.21 kg/m    Subjective:    Patient ID: George [name], male    DOB: May 03, 1946, 71 y.o.   MRN: [id number]  HPI: George [name] is a 71 y.o. male presenting on 03/25/2017 for Diabetes (3 month follow up); Hypertension; and Establish Care   HPI Type 2 diabetes mellitus Patient comes in today for recheck of his diabetes. Patient has been currently taking Jardiance and glimepiride and metformin, his last hemoglobin A1c was 7.6. His blood sugars been running better at home and says that it was 99 today and 106 yesterday.. Patient is currently on an ACE inhibitor/ARB. Patient has seen an ophthalmologist this year and did have retinopathy. Patient denies any issues with their feet.   Hyperlipidemia Patient is coming in for recheck of his hyperlipidemia. The patient is currently taking pravastatin. They deny any issues with myalgias or history of liver damage from it. They deny any focal numbness or weakness or chest pain.  Hypertension Patient is currently on lisinopril, and their blood pressure today is 132/72. Patient denies any lightheadedness or dizziness. Patient denies headaches, blurred vision, chest pains, shortness of breath, or weakness. Denies any side effects from medication and is content with current medication.  Relevant past medical, surgical, family and social history reviewed and updated as indicated. Interim medical history since our last visit reviewed. Allergies and medications reviewed and updated.  Review of Systems  Constitutional: Negative for chills and fever.  Respiratory: Negative for shortness of breath and wheezing.   Cardiovascular: Negative for chest pain and leg swelling.  Gastrointestinal: Negative for abdominal pain.  Musculoskeletal: Negative for back pain and gait problem.  Skin: Negative for rash.  Neurological: Negative for dizziness,  weakness, light-headedness and numbness.  All other systems reviewed and are negative.   Per HPI unless specifically indicated above      Objective:    BP 132/72   Pulse 67   Temp 97.2 F (36.2 C) (Oral)   Ht _1  (1.727 m)   Wt 172 lb 6.4 oz (78.2 kg)   BMI 26.21 kg/m   Wt Readings from Last 3 Encounters:  03/25/17 172 lb 6.4 oz (78.2 kg)  12/17/16 178 lb (80.7 kg)  08/13/16 177 lb (80.3 kg)    Physical Exam  Constitutional: He is oriented to person, place, and time. He appears well-developed and well-nourished. No distress.  Eyes: Conjunctivae are normal. No scleral icterus.  Neck: Neck supple. No thyromegaly present.  Cardiovascular: Normal rate, regular rhythm, normal heart sounds and intact distal pulses.   No murmur heard. Pulmonary/Chest: Effort normal and breath sounds normal. No respiratory distress. He has no wheezes. He has no rales.  Musculoskeletal: Normal range of motion. He exhibits no edema.  Lymphadenopathy:    He has no cervical adenopathy.  Neurological: He is alert and oriented to person, place, and time. Coordination normal.  Skin: Skin is warm and dry. No rash noted. He is not diaphoretic.  Psychiatric: He has a normal mood and affect. His behavior is normal.  Nursing note and vitals reviewed.  Diabetic Foot Exam - Simple   Simple Foot Form Diabetic Foot exam was performed with the following findings:  Yes 03/25/2017  8:51 AM  Visual Inspection No deformities, no ulcerations, no other skin breakdown bilaterally:  Yes Sensation  Testing Intact to touch and monofilament testing bilaterally:  Yes Pulse Check Posterior Tibialis and Dorsalis pulse intact bilaterally:  Yes Comments      Results for orders placed or performed in visit on 03/16/17  HM DIABETES EYE EXAM  Result Value Ref Range   HM Diabetic Eye Exam Retinopathy (A) No Retinopathy      Assessment & Plan:   Problem List Items Addressed This Visit      Cardiovascular and  Mediastinum   Essential hypertension, benign   Relevant Orders   CMP14+EGFR     Endocrine   Type 2 diabetes mellitus not at goal [facility]) - Primary   Relevant Orders   Bayer DCA Hb A1c Waived   Microalbumin / creatinine urine ratio   Mixed hyperlipidemia due to type 2 diabetes mellitus ([place])   Relevant Orders   Lipid panel   Diabetic retinopathy ([place])   Relevant Orders   Ambulatory referral to Ophthalmology     Other   Overweight (BMI 25.0-29.9)   Relevant Orders   Lipid panel       Follow up plan: Return in about 3 months (around 06/25/2017), or if symptoms worsen or fail to improve, for Diabetes and hypertension.  Counseling provided for all of the vaccine components Orders Placed This Encounter  Procedures  . Bayer DCA Hb A1c Waived  . CMP14+EGFR  . Lipid panel    Caryl Pina, MD [facility] 03/25/2017, 8:24 AM

## 2017-03-26 LAB — CMP14+EGFR
ALT: 24 IU/L (ref 0–44)
AST: 21 IU/L (ref 0–40)
Albumin/Globulin Ratio: 1.8 (ref 1.2–2.2)
Albumin: 4.4 g/dL (ref 3.5–4.8)
Alkaline Phosphatase: 57 IU/L (ref 39–117)
BUN/Creatinine Ratio: 19 (ref 10–24)
BUN: 17 mg/dL (ref 8–27)
Bilirubin Total: 0.5 mg/dL (ref 0.0–1.2)
CALCIUM: 9.8 mg/dL (ref 8.6–10.2)
CHLORIDE: 100 mmol/L (ref 96–106)
CO2: 22 mmol/L (ref 20–29)
CREATININE: 0.91 mg/dL (ref 0.76–1.27)
GFR calc non Af Amer: 84 mL/min/{1.73_m2} (ref >59)
GFR, EST AFRICAN AMERICAN: 97 mL/min/{1.73_m2} (ref >59)
GLUCOSE: 78 mg/dL (ref 65–99)
Globulin, Total: 2.5 g/dL (ref 1.5–4.5)
Potassium: 4.7 mmol/L (ref 3.5–5.2)
Sodium: 136 mmol/L (ref 134–144)
Total Protein: 6.9 g/dL (ref 6.0–8.5)

## 2017-03-26 LAB — LIPID PANEL
Chol/HDL Ratio: 4.3 ratio (ref 0.0–5.0)
Cholesterol, Total: 187 mg/dL (ref 100–199)
HDL: 44 mg/dL (ref >39)
LDL CALC: 123 mg/dL — AB (ref 0–99)
Triglycerides: 102 mg/dL (ref 0–149)
VLDL Cholesterol Cal: 20 mg/dL (ref 5–40)

## 2017-03-26 LAB — MICROALBUMIN / CREATININE URINE RATIO
CREATININE, UR: 69.1 mg/dL
Microalb/Creat Ratio: 13.2 mg/g creat (ref 0.0–30.0)
Microalbumin, Urine: 9.1 ug/mL

## 2017-03-28 IMAGING — CR DG TIBIA/FIBULA 2V*L*
2 series · 2 of 2 positions shown · non-contrast
Comparison: None.

CLINICAL DATA: Distal left lower extremity pain with no history of
trauma

EXAM:
LEFT TIBIA AND FIBULA - 2 VIEW

[view not recorded (1 of 2)]
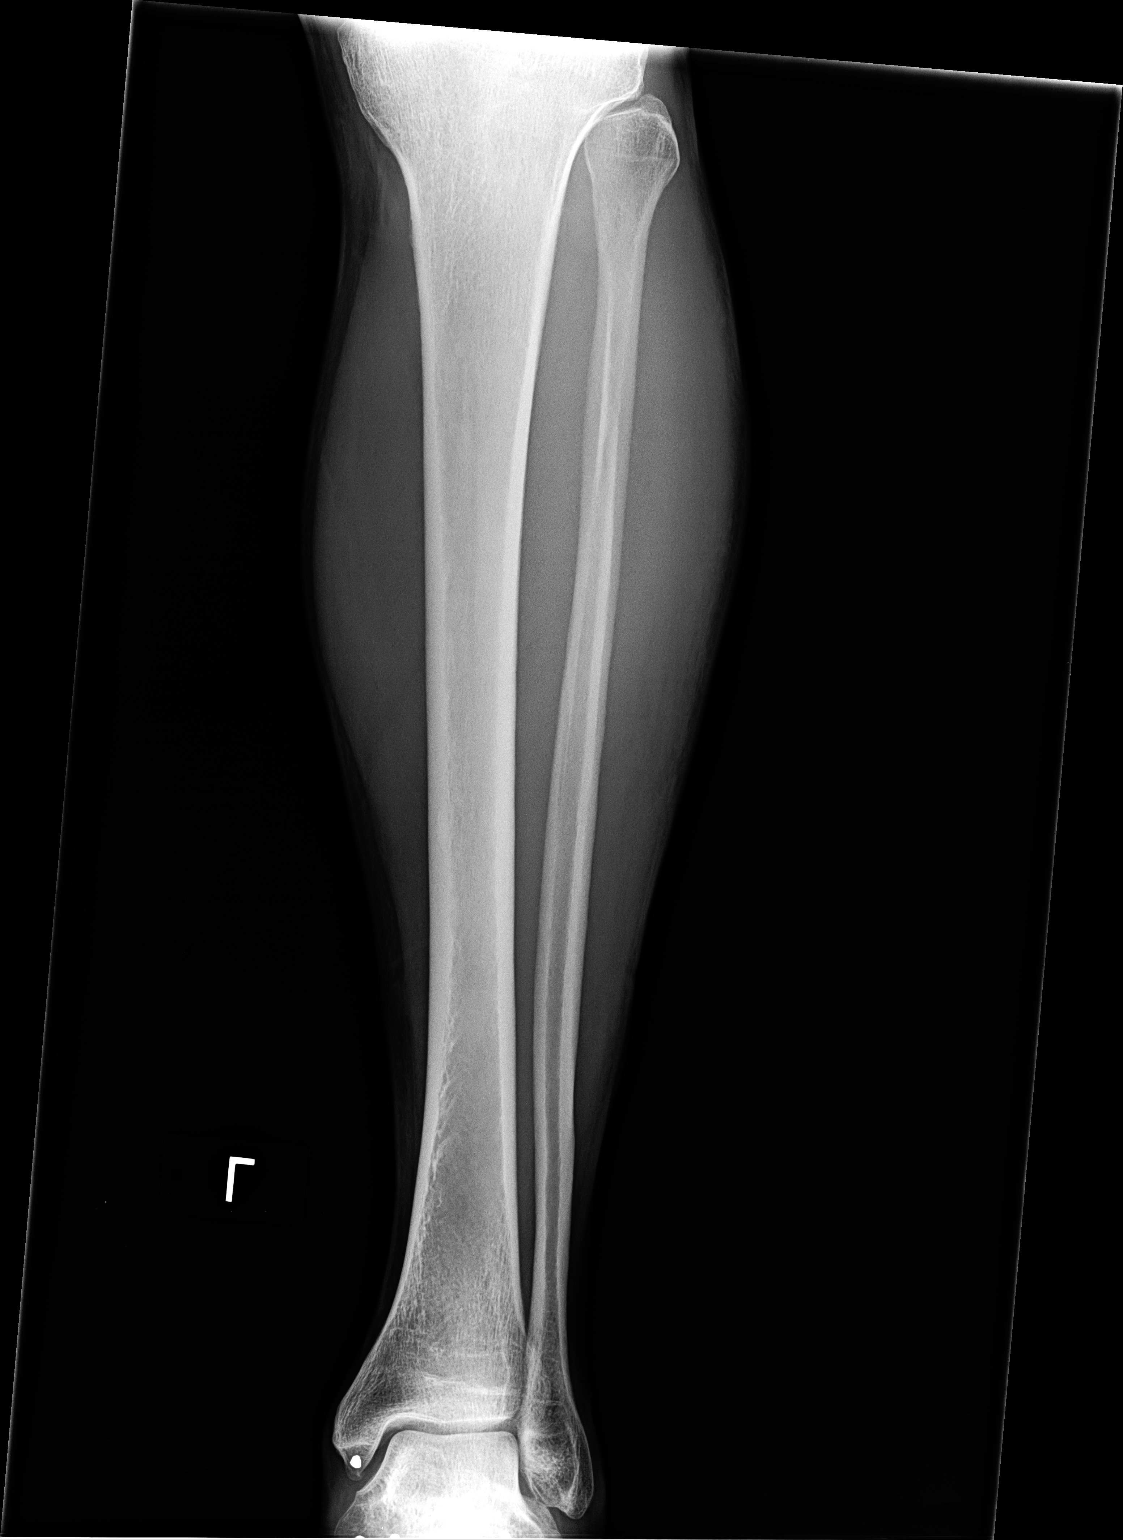

[view not recorded (2 of 2)]
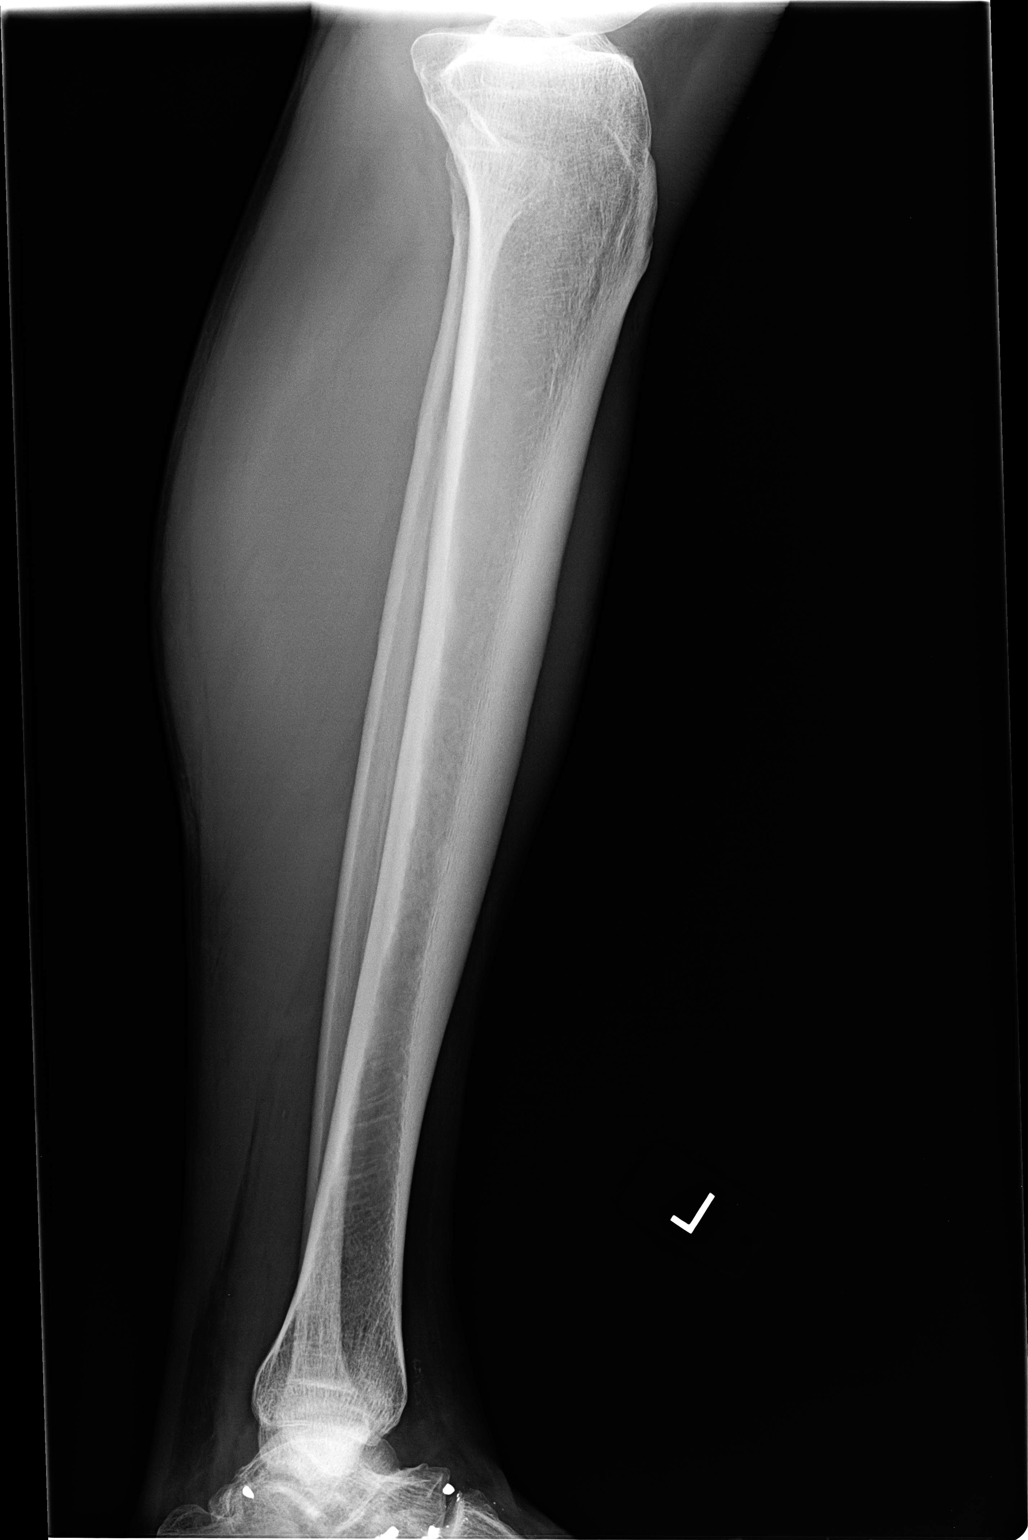

[2 of 2 positions shown; findings below may reference images not displayed]

FINDINGS: Tibial plateau is excluded from the field of view on the AP image.
No fracture or dislocation identified. No acute soft tissue
abnormalities. Multiple subcentimeter foreign bodies project over
the level of the ankle and hindfoot. There is faint calcification
anterior to the ankle joint which may be vascular.
IMPRESSION: No acute abnormalities involving the tibia or fibula with incomplete
imaging of the tibia.

Foreign bodies as described above.

## 2017-04-08 ENCOUNTER — Telehealth: Payer: Self-pay | Admitting: Family Medicine

## 2017-04-08 NOTE — Telephone Encounter (Signed)
Samples ready - pt wife aware

## 2017-04-20 ENCOUNTER — Other Ambulatory Visit: Payer: Self-pay | Admitting: Pediatrics

## 2017-05-08 IMAGING — DX DG CHEST 2V
2 series · 2 of 2 positions shown · non-contrast
Comparison: None.

CLINICAL DATA: Status post fall this morning.

EXAM:
CHEST  2 VIEW

[chest pa]
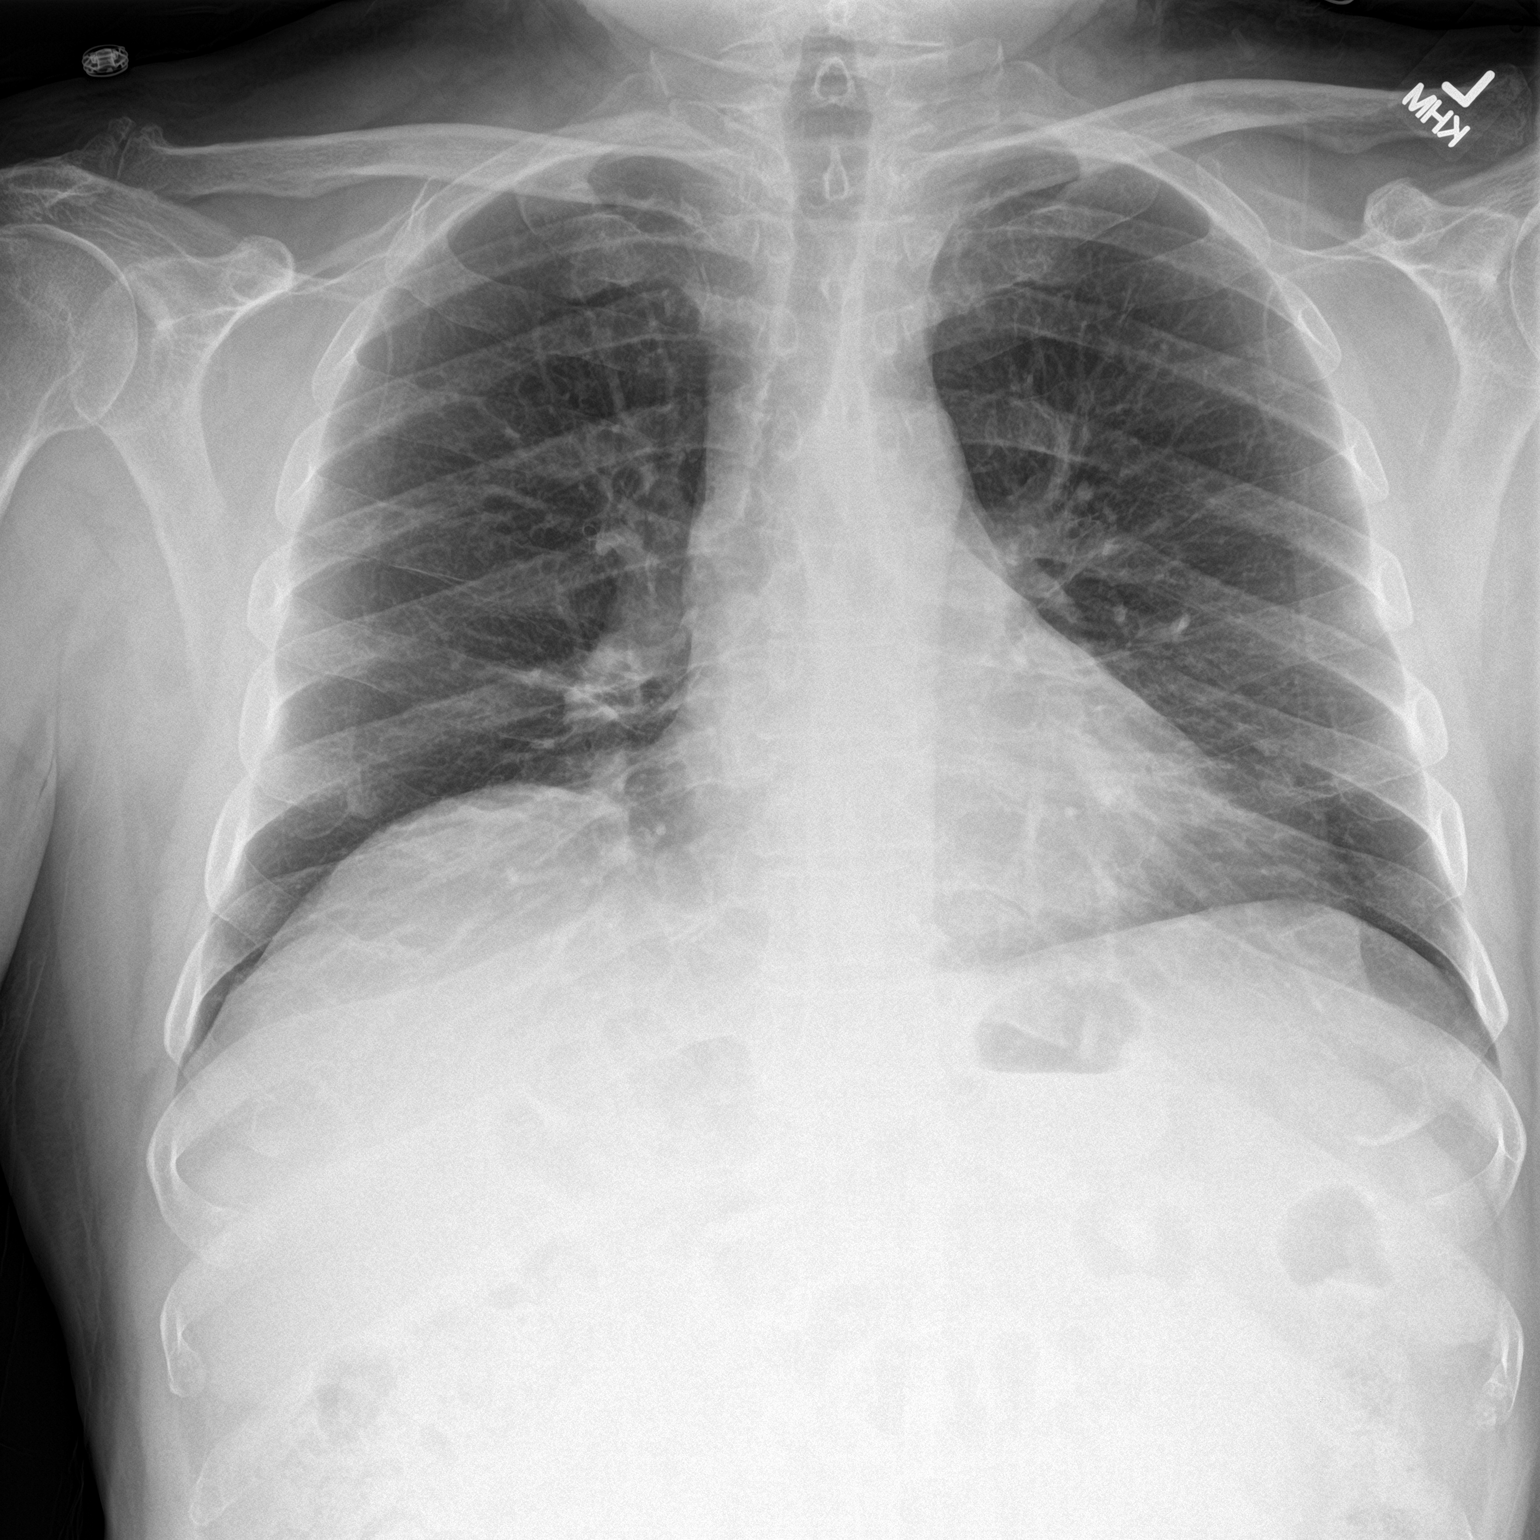

[chest lat]
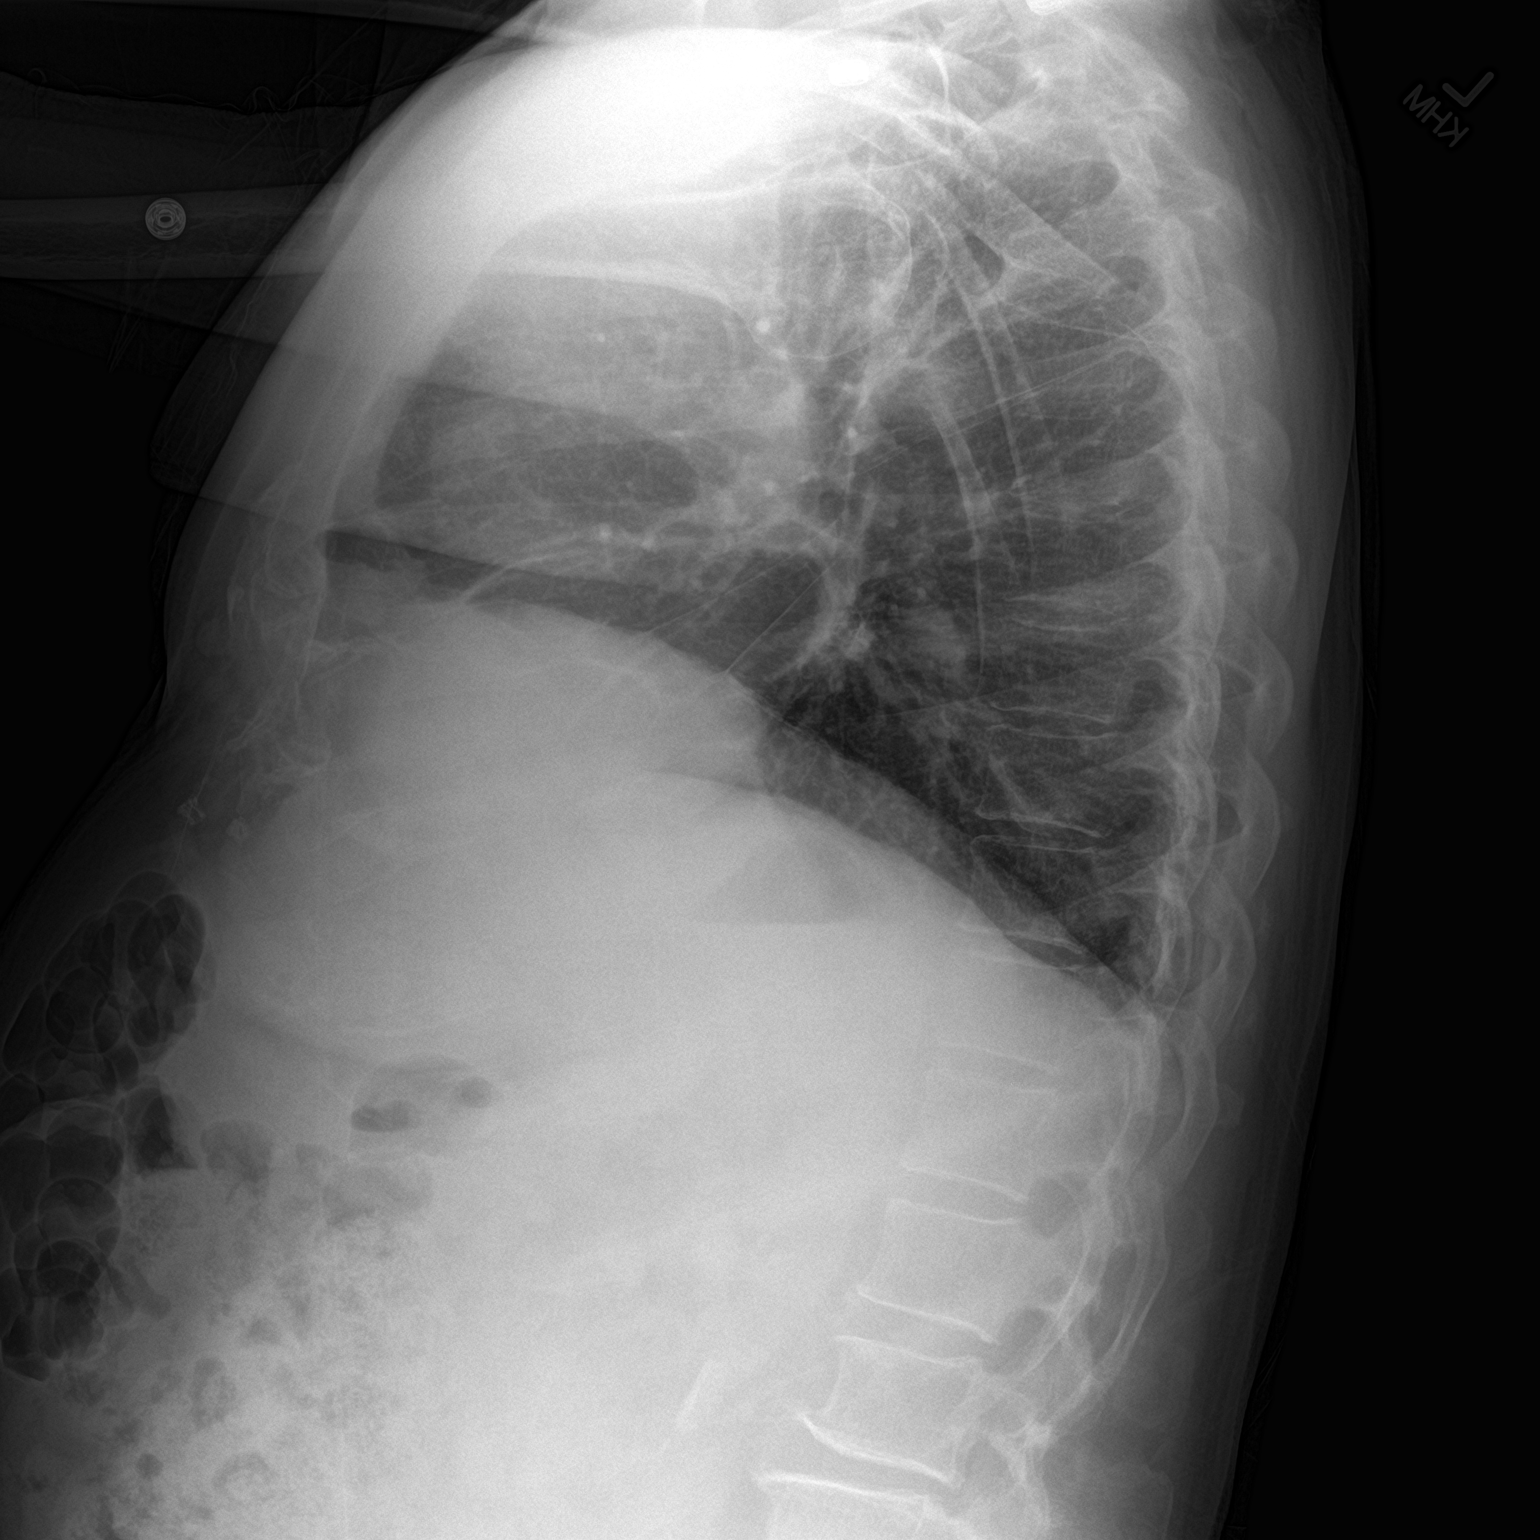

[2 of 2 positions shown; findings below may reference images not displayed]

FINDINGS: The heart size and mediastinal contours are within normal limits.
There is no focal infiltrate, pulmonary edema, or pleural effusion.
Small nodular density is identified in the right lung base probably
due to the nipple. Confirmation with nipple marker chest x-ray is
recommended. Mild degenerative joint changes of bilateral
acromioclavicular joint are noted. There is no acute fracture or
dislocation of visualized ribs.
IMPRESSION: No active cardiopulmonary disease.

Small nodular density is identified in the right lung base probably
due to the nipple. Confirmation with nipple marker chest x-ray is
recommended.

## 2017-05-31 ENCOUNTER — Other Ambulatory Visit: Payer: Self-pay | Admitting: Family Medicine

## 2017-06-24 ENCOUNTER — Other Ambulatory Visit: Payer: Self-pay | Admitting: Family Medicine

## 2017-07-01 ENCOUNTER — Ambulatory Visit: Payer: Self-pay | Admitting: Family Medicine

## 2017-07-03 ENCOUNTER — Other Ambulatory Visit: Payer: Self-pay | Admitting: Family Medicine

## 2017-07-04 NOTE — Telephone Encounter (Signed)
Next OV 07/08/17

## 2017-07-08 ENCOUNTER — Ambulatory Visit (INDEPENDENT_AMBULATORY_CARE_PROVIDER_SITE_OTHER): Payer: Medicare HMO | Admitting: Family Medicine

## 2017-07-08 ENCOUNTER — Encounter: Payer: Self-pay | Admitting: Family Medicine

## 2017-07-08 VITALS — BP 137/75 | HR 111 | Temp 98.5°F | Ht 68.0 in | Wt 172.0 lb

## 2017-07-08 DIAGNOSIS — E663 Overweight: Secondary | ICD-10-CM

## 2017-07-08 DIAGNOSIS — E1169 Type 2 diabetes mellitus with other specified complication: Secondary | ICD-10-CM

## 2017-07-08 DIAGNOSIS — E782 Mixed hyperlipidemia: Secondary | ICD-10-CM | POA: Diagnosis not present

## 2017-07-08 DIAGNOSIS — Z23 Encounter for immunization: Secondary | ICD-10-CM

## 2017-07-08 DIAGNOSIS — E11319 Type 2 diabetes mellitus with unspecified diabetic retinopathy without macular edema: Secondary | ICD-10-CM

## 2017-07-08 DIAGNOSIS — I10 Essential (primary) hypertension: Secondary | ICD-10-CM

## 2017-07-08 DIAGNOSIS — E119 Type 2 diabetes mellitus without complications: Secondary | ICD-10-CM

## 2017-07-08 LAB — GLUCOSE HEMOCUE WAIVED: Glu Hemocue Waived: 156 mg/dL — ABNORMAL HIGH (ref 65–99)

## 2017-07-08 LAB — BAYER DCA HB A1C WAIVED: HB A1C: 7.5 % — AB (ref <7.0)

## 2017-07-08 NOTE — Addendum Note (Signed)
Addended by: Quay Burow on: 07/08/2017 05:09 PM   Modules accepted: Orders

## 2017-07-08 NOTE — Progress Notes (Signed)
BP 137/75   Pulse (!) 111   Temp 98.5 F (36.9 C) (Oral)   Ht '5\' 8"'  (1.727 m)   Wt 172 lb (78 kg)   BMI 26.15 kg/m    Subjective:    Patient ID: George [name], male    DOB: Nov 25, 1945, 71 y.o.   MRN: [id number]  HPI: George [name] is a 71 y.o. male presenting on 07/08/2017 for Diabetes (follow up; patient is fasting); Hyperlipidemia; and Hypertension   HPI Type 2 diabetes mellitus Patient comes in today for recheck of his diabetes. Patient has been currently taking Jardiance and metformin and glimepiride. Patient is currently on an ACE inhibitor/ARB. Patient has seen an ophthalmologist this year and has been diagnosed with retinopathy and is followed up with them about. Patient denies any issues with their feet.   Hyperlipidemia Patient is coming in for recheck of his hyperlipidemia. The patient is currently taking cinnamon and fish oil. They deny any issues with myalgias or history of liver damage from it. They deny any focal numbness or weakness or chest pain.   Hypertension Patient is currently on lisinopril, and their blood pressure today is 137/75. Patient denies any lightheadedness or dizziness. Patient denies headaches, blurred vision, chest pains, shortness of breath, or weakness. Denies any side effects from medication and is content with current medication.   Relevant past medical, surgical, family and social history reviewed and updated as indicated. Interim medical history since our last visit reviewed. Allergies and medications reviewed and updated.  Review of Systems  Constitutional: Negative for chills and fever.  Respiratory: Negative for shortness of breath and wheezing.   Cardiovascular: Negative for chest pain and leg swelling.  Musculoskeletal: Negative for back pain and gait problem.  Skin: Negative for rash.  Neurological: Negative for dizziness, weakness, light-headedness and headaches.  All other systems reviewed and are negative.  Per HPI unless  specifically indicated above   Allergies as of 07/08/2017   No Known Allergies     Medication List       Accurate as of 07/08/17  9:17 AM. Always use your most recent med list.          aspirin 81 MG tablet Take 81 mg by mouth daily.   calcium carbonate 600 MG Tabs tablet Commonly known as:  OS-CAL Take 600 mg by mouth 2 (two) times daily with a meal.   CINNAMON PO Take 1,000 mg by mouth 2 (two) times daily.   empagliflozin 10 MG Tabs tablet Commonly known as:  JARDIANCE Take 10 mg by mouth daily.   fish oil-omega-3 fatty acids 1000 MG capsule Take 2 g by mouth daily.   glimepiride 4 MG tablet Commonly known as:  AMARYL TAKE 2 TABLETS (8 MG TOTAL) BY MOUTH DAILY BEFORE BREAKFAST   lisinopril 10 MG tablet Commonly known as:  PRINIVIL,ZESTRIL TAKE 1 TABLET BY MOUTH EVERY DAY   metFORMIN 1000 MG tablet Commonly known as:  GLUCOPHAGE TAKE 1 TABLET (1,000 MG TOTAL) BY MOUTH 2 (TWO) TIMES DAILY WITH A MEAL.   pravastatin 80 MG tablet Commonly known as:  PRAVACHOL TAKE 1 TABLET BY MOUTH EVERY DAY          Objective:    BP 137/75   Pulse (!) 111   Temp 98.5 F (36.9 C) (Oral)   Ht '5\' 8"'  (1.727 m)   Wt 172 lb (78 kg)   BMI 26.15 kg/m   Wt Readings from Last 3 Encounters:  07/08/17 172 lb (78  kg)  03/25/17 172 lb 6.4 oz (78.2 kg)  12/17/16 178 lb (80.7 kg)    Physical Exam  Constitutional: He is oriented to person, place, and time. He appears well-developed and well-nourished. No distress.  Eyes: Conjunctivae are normal. No scleral icterus.  Neck: Neck supple. No thyromegaly present.  Cardiovascular: Normal rate, regular rhythm, normal heart sounds and intact distal pulses.   No murmur heard. Pulmonary/Chest: Effort normal and breath sounds normal. No respiratory distress. He has no wheezes.  Musculoskeletal: Normal range of motion. He exhibits no edema.  Lymphadenopathy:    He has no cervical adenopathy.  Neurological: He is alert and oriented to  person, place, and time. Coordination normal.  Skin: Skin is warm and dry. No rash noted. He is not diaphoretic.  Psychiatric: He has a normal mood and affect. His behavior is normal.  Nursing note and vitals reviewed.   Results for orders placed or performed in visit on 03/25/17  Bayer DCA Hb A1c Waived  Result Value Ref Range   Bayer DCA Hb A1c Waived 7.4 (H) <7.0 %  CMP14+EGFR  Result Value Ref Range   Glucose 78 65 - 99 mg/dL   BUN 17 8 - 27 mg/dL   Creatinine, Ser 0.91 0.76 - 1.27 mg/dL   GFR calc non Af Amer 84 >59 mL/min/1.73   GFR calc Af Amer 97 >59 mL/min/1.73   BUN/Creatinine Ratio 19 10 - 24   Sodium 136 134 - 144 mmol/L   Potassium 4.7 3.5 - 5.2 mmol/L   Chloride 100 96 - 106 mmol/L   CO2 22 20 - 29 mmol/L   Calcium 9.8 8.6 - 10.2 mg/dL   Total Protein 6.9 6.0 - 8.5 g/dL   Albumin 4.4 3.5 - 4.8 g/dL   Globulin, Total 2.5 1.5 - 4.5 g/dL   Albumin/Globulin Ratio 1.8 1.2 - 2.2   Bilirubin Total 0.5 0.0 - 1.2 mg/dL   Alkaline Phosphatase 57 39 - 117 IU/L   AST 21 0 - 40 IU/L   ALT 24 0 - 44 IU/L  Lipid panel  Result Value Ref Range   Cholesterol, Total 187 100 - 199 mg/dL   Triglycerides 102 0 - 149 mg/dL   HDL 44 >39 mg/dL   VLDL Cholesterol Cal 20 5 - 40 mg/dL   LDL Calculated 123 (H) 0 - 99 mg/dL   Chol/HDL Ratio 4.3 0.0 - 5.0 ratio  Microalbumin / creatinine urine ratio  Result Value Ref Range   Creatinine, Urine 69.1 Not Estab. mg/dL   Albumin, Urine 9.1 Not Estab. ug/mL   Microalb/Creat Ratio 13.2 0.0 - 30.0 mg/g creat      Assessment & Plan:   Problem List Items Addressed This Visit      Cardiovascular and Mediastinum   Essential hypertension, benign     Endocrine   Type 2 diabetes mellitus not at goal [facility]) - Primary   Relevant Orders   Bayer DCA Hb A1c Waived   Mixed hyperlipidemia due to type 2 diabetes mellitus (HCC)   Diabetic retinopathy ([place])     Other   Overweight (BMI 25.0-29.9)       Follow up plan: Return in about 3 months  (around 10/08/2017), or if symptoms worsen or fail to improve, for recheck diabetes.  Counseling provided for all of the vaccine components Orders Placed This Encounter  Procedures  . Bayer [facility] Hb A1c Waived    Caryl Pina, MD [facility] 07/08/2017, 9:17 AM

## 2017-07-09 ENCOUNTER — Encounter (HOSPITAL_COMMUNITY): Payer: Self-pay | Admitting: Emergency Medicine

## 2017-07-09 ENCOUNTER — Observation Stay (HOSPITAL_COMMUNITY)
Admission: EM | Admit: 2017-07-09 | Discharge: 2017-07-11 | Disposition: A | Discharge status: Home / Self Care | Payer: Medicare HMO | Attending: Family Medicine | Admitting: Family Medicine

## 2017-07-09 ENCOUNTER — Emergency Department (HOSPITAL_COMMUNITY): Payer: Medicare HMO

## 2017-07-09 DIAGNOSIS — R509 Fever, unspecified: Secondary | ICD-10-CM | POA: Diagnosis not present

## 2017-07-09 DIAGNOSIS — E1169 Type 2 diabetes mellitus with other specified complication: Secondary | ICD-10-CM

## 2017-07-09 DIAGNOSIS — Z7982 Long term (current) use of aspirin: Secondary | ICD-10-CM | POA: Insufficient documentation

## 2017-07-09 DIAGNOSIS — R3915 Urgency of urination: Secondary | ICD-10-CM | POA: Diagnosis not present

## 2017-07-09 DIAGNOSIS — E785 Hyperlipidemia, unspecified: Secondary | ICD-10-CM

## 2017-07-09 DIAGNOSIS — Z7984 Long term (current) use of oral hypoglycemic drugs: Secondary | ICD-10-CM | POA: Insufficient documentation

## 2017-07-09 DIAGNOSIS — R350 Frequency of micturition: Secondary | ICD-10-CM | POA: Insufficient documentation

## 2017-07-09 DIAGNOSIS — Z79899 Other long term (current) drug therapy: Secondary | ICD-10-CM | POA: Insufficient documentation

## 2017-07-09 DIAGNOSIS — I10 Essential (primary) hypertension: Secondary | ICD-10-CM | POA: Insufficient documentation

## 2017-07-09 DIAGNOSIS — Z87891 Personal history of nicotine dependence: Secondary | ICD-10-CM | POA: Diagnosis not present

## 2017-07-09 DIAGNOSIS — E119 Type 2 diabetes mellitus without complications: Secondary | ICD-10-CM | POA: Diagnosis not present

## 2017-07-09 DIAGNOSIS — N390 Urinary tract infection, site not specified: Secondary | ICD-10-CM | POA: Diagnosis not present

## 2017-07-09 LAB — COMPREHENSIVE METABOLIC PANEL WITH GFR
ALT: 43 U/L (ref 17–63)
AST: 35 U/L (ref 15–41)
Albumin: 3.7 g/dL (ref 3.5–5.0)
Alkaline Phosphatase: 68 U/L (ref 38–126)
Anion gap: 12 (ref 5–15)
BUN: 19 mg/dL (ref 6–20)
CO2: 23 mmol/L (ref 22–32)
Calcium: 9.5 mg/dL (ref 8.9–10.3)
Chloride: 102 mmol/L (ref 101–111)
Creatinine, Ser: 0.96 mg/dL (ref 0.61–1.24)
GFR calc Af Amer: >60 mL/min
GFR calc non Af Amer: >60 mL/min
Glucose, Bld: 164 mg/dL — ABNORMAL HIGH (ref 65–99)
Potassium: 3.9 mmol/L (ref 3.5–5.1)
Sodium: 137 mmol/L (ref 135–145)
Total Bilirubin: 1.4 mg/dL — ABNORMAL HIGH (ref 0.3–1.2)
Total Protein: 7.3 g/dL (ref 6.5–8.1)

## 2017-07-09 LAB — CBC WITH DIFFERENTIAL/PLATELET
Basophils Absolute: 0 K/uL (ref 0.0–0.1)
Basophils Relative: 0 %
Eosinophils Absolute: 0 K/uL (ref 0.0–0.7)
Eosinophils Relative: 0 %
HCT: 41.5 % (ref 39.0–52.0)
Hemoglobin: 13.6 g/dL (ref 13.0–17.0)
Lymphocytes Relative: 3 %
Lymphs Abs: 0.7 K/uL (ref 0.7–4.0)
MCH: 28 pg (ref 26.0–34.0)
MCHC: 32.8 g/dL (ref 30.0–36.0)
MCV: 85.6 fL (ref 78.0–100.0)
Monocytes Absolute: 1.7 K/uL — ABNORMAL HIGH (ref 0.1–1.0)
Monocytes Relative: 7 %
Neutro Abs: 20.9 K/uL — ABNORMAL HIGH (ref 1.7–7.7)
Neutrophils Relative %: 90 %
Platelets: 176 K/uL (ref 150–400)
RBC: 4.85 MIL/uL (ref 4.22–5.81)
RDW: 13.9 % (ref 11.5–15.5)
WBC: 23.3 K/uL — ABNORMAL HIGH (ref 4.0–10.5)

## 2017-07-09 LAB — URINALYSIS, ROUTINE W REFLEX MICROSCOPIC
Bacteria, UA: NONE SEEN
Bilirubin Urine: NEGATIVE
Glucose, UA: >=500 mg/dL — AB
Ketones, ur: 20 mg/dL — AB
Leukocytes, UA: NEGATIVE
Nitrite: NEGATIVE
Protein, ur: 30 mg/dL — AB
Specific Gravity, Urine: 1.031 — ABNORMAL HIGH (ref 1.005–1.030)
pH: 5 (ref 5.0–8.0)

## 2017-07-09 LAB — GLUCOSE, CAPILLARY: Glucose-Capillary: 227 mg/dL — ABNORMAL HIGH (ref 65–99)

## 2017-07-09 LAB — LACTIC ACID, PLASMA
LACTIC ACID, VENOUS: 1.8 mmol/L (ref 0.5–1.9)
Lactic Acid, Venous: 1.4 mmol/L (ref 0.5–1.9)

## 2017-07-09 MED ORDER — CALCIUM CARBONATE 600 MG PO TABS: 600.0000 mg | ORAL_TABLET | Freq: Every day | ORAL | Status: DC

## 2017-07-09 MED ORDER — ACETAMINOPHEN 325 MG PO TABS
650.0000 mg | ORAL_TABLET | Freq: Once | ORAL | Status: AC
  Administered 2017-07-09: 650 mg via ORAL
  Filled 2017-07-09: qty 2

## 2017-07-09 MED ORDER — ASPIRIN EC 81 MG PO TBEC
81.0000 mg | DELAYED_RELEASE_TABLET | Freq: Every day | ORAL | Status: DC
  Administered 2017-07-09 – 2017-07-11 (×3): 81 mg via ORAL
  Filled 2017-07-09 (×5): qty 1

## 2017-07-09 MED ORDER — ACETAMINOPHEN 650 MG RE SUPP: 650.0000 mg | Freq: Four times a day (QID) | RECTAL | Status: DC | PRN

## 2017-07-09 MED ORDER — OMEGA-3-ACID ETHYL ESTERS 1 G PO CAPS
2.0000 g | ORAL_CAPSULE | Freq: Every day | ORAL | Status: DC
  Administered 2017-07-09 – 2017-07-11 (×3): 2 g via ORAL
  Filled 2017-07-09 (×3): qty 2

## 2017-07-09 MED ORDER — LEVOFLOXACIN IN D5W 500 MG/100ML IV SOLN
500.0000 mg | Freq: Once | INTRAVENOUS | Status: AC
  Administered 2017-07-09: 500 mg via INTRAVENOUS
  Filled 2017-07-09: qty 100

## 2017-07-09 MED ORDER — CALCIUM CARBONATE 1250 (500 CA) MG PO TABS
1.0000 | ORAL_TABLET | Freq: Every day | ORAL | Status: DC
  Administered 2017-07-10 – 2017-07-11 (×2): 500 mg via ORAL
  Filled 2017-07-09 (×2): qty 1

## 2017-07-09 MED ORDER — SODIUM CHLORIDE 0.9 % IV BOLUS (SEPSIS)
2000.0000 mL | Freq: Once | INTRAVENOUS | Status: AC
  Administered 2017-07-09: 2000 mL via INTRAVENOUS

## 2017-07-09 MED ORDER — PRAVASTATIN SODIUM 40 MG PO TABS
80.0000 mg | ORAL_TABLET | Freq: Every day | ORAL | Status: DC
  Administered 2017-07-10 – 2017-07-11 (×2): 80 mg via ORAL
  Filled 2017-07-09 (×2): qty 2

## 2017-07-09 MED ORDER — SODIUM CHLORIDE 0.9 % IV SOLN: INTRAVENOUS | Status: DC

## 2017-07-09 MED ORDER — ACETAMINOPHEN 325 MG PO TABS: 650.0000 mg | ORAL_TABLET | Freq: Four times a day (QID) | ORAL | Status: DC | PRN

## 2017-07-09 MED ORDER — ENOXAPARIN SODIUM 40 MG/0.4ML ~~LOC~~ SOLN
40.0000 mg | SUBCUTANEOUS | Status: DC
  Administered 2017-07-09 – 2017-07-10 (×2): 40 mg via SUBCUTANEOUS
  Filled 2017-07-09 (×2): qty 0.4

## 2017-07-09 MED ORDER — LISINOPRIL 10 MG PO TABS
10.0000 mg | ORAL_TABLET | Freq: Every day | ORAL | Status: DC
  Administered 2017-07-10 – 2017-07-11 (×2): 10 mg via ORAL
  Filled 2017-07-09 (×2): qty 1

## 2017-07-09 MED ORDER — INSULIN ASPART 100 UNIT/ML ~~LOC~~ SOLN
0.0000 [IU] | Freq: Three times a day (TID) | SUBCUTANEOUS | Status: DC
  Administered 2017-07-10: 2 [IU] via SUBCUTANEOUS

## 2017-07-09 MED ORDER — OMEGA-3 FATTY ACIDS 1000 MG PO CAPS: 2.0000 g | ORAL_CAPSULE | Freq: Every day | ORAL | Status: DC

## 2017-07-09 NOTE — ED Notes (Signed)
EKG given to Dr. Zammit 

## 2017-07-09 NOTE — ED Triage Notes (Signed)
Pt/wife report fever, mild confusion, and urinary frequency/urgency since yesterday.  Pt reports back pain.
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
## 2017-07-09 NOTE — ED Notes (Signed)
Patient in radiology

## 2017-07-10 DIAGNOSIS — I10 Essential (primary) hypertension: Secondary | ICD-10-CM

## 2017-07-10 DIAGNOSIS — E785 Hyperlipidemia, unspecified: Secondary | ICD-10-CM | POA: Diagnosis not present

## 2017-07-10 DIAGNOSIS — E1169 Type 2 diabetes mellitus with other specified complication: Secondary | ICD-10-CM | POA: Diagnosis not present

## 2017-07-10 DIAGNOSIS — R509 Fever, unspecified: Secondary | ICD-10-CM | POA: Diagnosis not present

## 2017-07-10 DIAGNOSIS — N390 Urinary tract infection, site not specified: Secondary | ICD-10-CM | POA: Diagnosis not present

## 2017-07-10 DIAGNOSIS — R3915 Urgency of urination: Secondary | ICD-10-CM | POA: Diagnosis not present

## 2017-07-10 LAB — GLUCOSE, CAPILLARY
Glucose-Capillary: 87 mg/dL (ref 65–99)
Glucose-Capillary: 167 mg/dL — ABNORMAL HIGH (ref 65–99)
Glucose-Capillary: 86 mg/dL (ref 65–99)
Glucose-Capillary: 155 mg/dL — ABNORMAL HIGH (ref 65–99)

## 2017-07-10 LAB — COMPREHENSIVE METABOLIC PANEL
ALBUMIN: 3.1 g/dL — AB (ref 3.5–5.0)
ALK PHOS: 64 U/L (ref 38–126)
ALT: 31 U/L (ref 17–63)
ANION GAP: 9 (ref 5–15)
AST: 21 U/L (ref 15–41)
BUN: 21 mg/dL — ABNORMAL HIGH (ref 6–20)
CALCIUM: 8.4 mg/dL — AB (ref 8.9–10.3)
CHLORIDE: 102 mmol/L (ref 101–111)
CO2: 26 mmol/L (ref 22–32)
Creatinine, Ser: 0.82 mg/dL (ref 0.61–1.24)
GFR calc Af Amer: >60 mL/min (ref >60)
GFR calc non Af Amer: >60 mL/min (ref >60)
GLUCOSE: 80 mg/dL (ref 65–99)
POTASSIUM: 3.8 mmol/L (ref 3.5–5.1)
SODIUM: 137 mmol/L (ref 135–145)
Total Bilirubin: 0.6 mg/dL (ref 0.3–1.2)
Total Protein: 6.1 g/dL — ABNORMAL LOW (ref 6.5–8.1)

## 2017-07-10 LAB — HEMOGLOBIN A1C
HEMOGLOBIN A1C: 7.7 % — AB (ref 4.8–5.6)
MEAN PLASMA GLUCOSE: 174.29 mg/dL

## 2017-07-10 LAB — CBC
HCT: 36.1 % — ABNORMAL LOW (ref 39.0–52.0)
HEMOGLOBIN: 11.8 g/dL — AB (ref 13.0–17.0)
MCH: 28.1 pg (ref 26.0–34.0)
MCHC: 32.7 g/dL (ref 30.0–36.0)
MCV: 86 fL (ref 78.0–100.0)
PLATELETS: 145 10*3/uL — AB (ref 150–400)
RBC: 4.2 MIL/uL — ABNORMAL LOW (ref 4.22–5.81)
RDW: 14.1 % (ref 11.5–15.5)
WBC: 14.3 10*3/uL — ABNORMAL HIGH (ref 4.0–10.5)

## 2017-07-10 MED ORDER — LEVOFLOXACIN 500 MG PO TABS
500.0000 mg | ORAL_TABLET | Freq: Every day | ORAL | Status: DC
  Administered 2017-07-11: 500 mg via ORAL
  Filled 2017-07-10: qty 1

## 2017-07-10 MED ORDER — LEVOFLOXACIN IN D5W 500 MG/100ML IV SOLN
500.0000 mg | Freq: Once | INTRAVENOUS | Status: AC
  Administered 2017-07-10: 500 mg via INTRAVENOUS
  Filled 2017-07-10: qty 100

## 2017-07-10 NOTE — Progress Notes (Signed)
[facility]  PROGRESS NOTE  George [name] ZOX:[id number] DOB: May 31, 1946 DOA: 07/09/2017 PCP: Dettinger, Elige Radon, MD   Brief HPI:    71 y.o. male, with history of diabetes mellitus, hypertension, hyperlipidemia came to hospital with increased frequency of urination. 2 episodes of vomiting yesterday and one episode this morning. Patient says that he has noticed urgency of urination and difficulty controlling bladder. Patient has been taking Jardiance for diabetes mellitus.    Subjective   Patient seen and examined, denies dysuria urgency or frequency of urination. WBC has improved to 14,000 after IV antibiotics were given. Urine culture is still pending.   Assessment/plan  UTI- WBC improved to 14,000, patient's symptoms have resolved. Urine culture is still pending. Continue with Levaquin per pharmacy consultation. Patient is afebrile at this time.  Diabetes mellitus- oral hypoglycemic agents on hold, continue sliding scale insulin with NovoLog.  Hypertension-blood pressure stable, continue lisinopril  Hyperlipidemia-continue Pravachol  DVT prophylaxis: Lovenox  Code Status: DO NOT RESUSCITATE  Family Communication:  no family at bedside   Disposition Plan: Likely home in 1-2 days    Consultants:   none   Procedures None  Continuous infusions . sodium chloride    . levofloxacin (LEVAQUIN) IV        Antibiotics:   Anti-infectives    Start     Dose/Rate Route Frequency Ordered Stop   07/11/17 1000  levofloxacin (LEVAQUIN) tablet 500 mg     500 mg Oral Daily 07/10/17 0918 07/15/17 0959   07/10/17 1000  levofloxacin (LEVAQUIN) IVPB 500 mg     500 mg 100 mL/hr over 60 Minutes Intravenous  Once 07/10/17 0918     07/09/17 1245  levofloxacin (LEVAQUIN) IVPB 500 mg     500 mg 100 mL/hr over 60 Minutes Intravenous  Once 07/09/17 1244 07/09/17 1440       Objective   Vitals:   07/09/17 1813 07/09/17 2046 07/09/17 2102 07/10/17 0534  BP: (!) 116/57   (!) 109/52 106/60  Pulse: 73  78 (!) 52  Resp: Temp: 98.2 F (36.8 C)  (!) 97.5 F (36.4 C) 98.9 F (37.2 C)  TempSrc:   Oral Oral  SpO2: 100% 100% 98% 98%  Weight: 78 kg (172 lb)     Height:  (1.727 m)       Intake/Output Summary (Last 24 hours) at 07/10/17 0941 Last data filed at 07/10/17 0130  Gross per 24 hour  Intake             2000 ml  Output              600 ml  Net             1400 ml   Filed Weights   07/09/17 1225 07/09/17 1813  Weight: 78 kg (172 lb) 78 kg (172 lb)     Physical Examination:   Physical Exam: Eyes: No icterus, extraocular muscles intact  Mouth: Oral mucosa is moist, no lesions on palate,  Neck: Supple, no deformities, masses, or tenderness Lungs: Normal respiratory effort, bilateral clear to auscultation, no crackles or wheezes.  Heart: Regular rate and rhythm, S1 and S2 normal, no murmurs, rubs auscultated Abdomen: BS normoactive,soft,nondistended,non-tender to palpation,no organomegaly Extremities: No pretibial edema, no erythema, no cyanosis, no clubbing Neuro : Alert and oriented to time, place and person, No focal deficits  Skin: No rashes seen on exam     Data Reviewed: I have personally reviewed following labs  and imaging studies  CBG:  Recent Labs Lab 07/09/17 2104 07/10/17 0745  GLUCAP 227* 87    CBC:  Recent Labs Lab 07/09/17 1242 07/10/17 0357  WBC 23.3* 14.3*  NEUTROABS 20.9*  --   HGB 13.6 11.8*  HCT 41.5 36.1*  MCV 85.6 86.0  PLT 176 145*    Basic Metabolic Panel:  Recent Labs Lab 07/09/17 1242 07/10/17 0357  NA 137 137  K 3.9 3.8  CL 102 102  CO2 23 26  GLUCOSE 164* 80  BUN 19 21*  CREATININE 0.96 0.82  CALCIUM 9.5 8.4*    Recent Results (from the past 240 hour(s))  Blood Culture (routine x 2)     Status: None (Preliminary result)   Collection Time: 07/09/17 12:47 PM  Result Value Ref Range Status   Specimen Description RIGHT ANTECUBITAL  Final   Special Requests   Final     BOTTLES DRAWN AEROBIC AND ANAEROBIC Blood Culture adequate volume   Culture NO GROWTH < 24 HOURS  Final   Report Status PENDING  Incomplete  Blood Culture (routine x 2)     Status: None (Preliminary result)   Collection Time: 07/09/17 12:52 PM  Result Value Ref Range Status   Specimen Description BLOOD  Final   Special Requests NONE  Final   Culture NO GROWTH < 24 HOURS  Final   Report Status PENDING  Incomplete     Liver Function Tests:  Recent Labs Lab 07/09/17 1242 07/10/17 0357  AST 35 21  ALT 43 31  ALKPHOS 68 64  BILITOT 1.4* 0.6  PROT 7.3 6.1*  ALBUMIN 3.7 3.1*   No results for input(s): LIPASE, AMYLASE in the last 168 hours. No results for input(s): AMMONIA in the last 168 hours.  Cardiac Enzymes: No results for input(s): CKTOTAL, CKMB, CKMBINDEX, TROPONINI in the last 168 hours. BNP (last 3 results) No results for input(s): BNP in the last 8760 hours.  ProBNP (last 3 results) No results for input(s): PROBNP in the last 8760 hours.    Studies: Dg Chest 2 View  Result Date: 07/09/2017 CLINICAL DATA:  Fever.  Confusion. EXAM: CHEST  2 VIEW COMPARISON:  07/02/2015 FINDINGS: Cardiac silhouette is normal in size and configuration. No mediastinal or hilar masses. No evidence of adenopathy. Linear opacity noted at the left lung base consistent with atelectasis or scarring, similar to the prior study. Lungs otherwise clear. No pleural effusion or pneumothorax. Skeletal structures are intact. IMPRESSION: No active cardiopulmonary disease. Electronically Signed   By: Amie Portland M.D.   On: 07/09/2017 13:54    Scheduled Meds: . aspirin EC  81 mg Oral Daily  . calcium carbonate  1 tablet Oral Q breakfast  . enoxaparin (LOVENOX) injection  40 mg Subcutaneous Q24H  . insulin aspart  0-9 Units Subcutaneous TID WC  . [START ON 07/11/2017] levofloxacin  500 mg Oral Daily  . lisinopril  10 mg Oral Daily  . omega-3 acid ethyl esters  2 g Oral Daily  . pravastatin  80 mg  Oral Daily      Time spent: 20 min  [facility]   [facility] Pager [phone]. If 7PM-7AM, please contact night-coverage at [email or web address], Office  [phone]  password [email or web address]  07/10/2017, 9:41 AM  LOS: 0 days

## 2017-07-10 NOTE — Progress Notes (Signed)
Pharmacy Antibiotic Note  George [name] is a 71 y.o. male admitted on 07/09/2017 with UTI.  Pharmacy has been consulted for [facility] dosing.  Plan: Levaquin  IV today x 1 then  po daily x 5 days total Monitor labs, progress, c/s  Height:  (172.7 cm) Weight: 172 lb (78 kg) IBW/kg (Calculated) : 68.4  Temp (24hrs), Avg:98.8 F (37.1 C), Min:97.5 F (36.4 C), Max:100.4 F (38 C)   Recent Labs Lab 07/09/17 1242 07/09/17 1502 07/10/17 0357  WBC 23.3*  --  14.3*  CREATININE 0.96  --  0.82  LATICACIDVEN 1.8 1.4  --     Estimated Creatinine Clearance: 78.8 mL/min (by C-G formula based on SCr of 0.82 mg/dL).   No Known Allergies  Antimicrobials this admission: Levaquin 10/7 >>   Dose adjustments this admission:  Microbiology results:  BCx: pending  UCx: pending   Thank you for allowing pharmacy to be a part of this patient's care.  Valrie Hart A 07/10/2017 9:19 AM

## 2017-07-11 DIAGNOSIS — R509 Fever, unspecified: Secondary | ICD-10-CM

## 2017-07-11 DIAGNOSIS — I10 Essential (primary) hypertension: Secondary | ICD-10-CM | POA: Diagnosis not present

## 2017-07-11 DIAGNOSIS — E1169 Type 2 diabetes mellitus with other specified complication: Secondary | ICD-10-CM | POA: Diagnosis not present

## 2017-07-11 DIAGNOSIS — R3915 Urgency of urination: Secondary | ICD-10-CM | POA: Diagnosis not present

## 2017-07-11 DIAGNOSIS — E785 Hyperlipidemia, unspecified: Secondary | ICD-10-CM | POA: Diagnosis not present

## 2017-07-11 DIAGNOSIS — N390 Urinary tract infection, site not specified: Secondary | ICD-10-CM | POA: Diagnosis not present

## 2017-07-11 LAB — CBC
HEMATOCRIT: 37.2 % — AB (ref 39.0–52.0)
Hemoglobin: 12.3 g/dL — ABNORMAL LOW (ref 13.0–17.0)
MCH: 28.2 pg (ref 26.0–34.0)
MCHC: 33.1 g/dL (ref 30.0–36.0)
MCV: 85.3 fL (ref 78.0–100.0)
PLATELETS: 165 10*3/uL (ref 150–400)
RBC: 4.36 MIL/uL (ref 4.22–5.81)
RDW: 13.8 % (ref 11.5–15.5)
WBC: 7.8 10*3/uL (ref 4.0–10.5)

## 2017-07-11 LAB — URINE CULTURE: Culture: NO GROWTH

## 2017-07-11 LAB — GLUCOSE, CAPILLARY: Glucose-Capillary: 93 mg/dL (ref 65–99)

## 2017-07-11 MED ORDER — LEVOFLOXACIN 500 MG PO TABS: 500.0000 mg | ORAL_TABLET | Freq: Every day | ORAL | 0 refills | Status: AC

## 2017-07-11 NOTE — Progress Notes (Signed)
Patient discharged home with personal belongings, IVs removed and sites intact. Patient discharged with prescriptions.

## 2017-07-11 NOTE — Discharge Summary (Signed)
Physician Discharge Summary  George [name] MWN:[id number] DOB: 1946/03/17 DOA: 07/09/2017  PCP: Dettinger, Elige Radon, MD  Admit date: 07/09/2017 Discharge date: 07/11/2017  Time spent: *35* minutes  Recommendations for Outpatient Follow-up:  1. Follow up PCP in 2 weeks   Discharge Diagnoses:  Active Problems:   Urgency of urination   Type 2 diabetes mellitus with hyperlipidemia [facility])   Discharge Condition: Stable  Diet recommendation: Carb modified diet  Filed Weights   07/09/17 1225 07/09/17 1813  Weight: 78 kg (172 lb) 78 kg (172 lb)    History of present illness:  71 y.o.male,with history of diabetes mellitus, hypertension, hyperlipidemia came to hospital with increased frequency of urination. 2 episodes of vomiting yesterday and one episode this morning. Patient says that he has noticed urgency of urination and difficulty controlling bladder.Patient has been taking Jardiancefor diabetes mellitus.    Hospital Course:  UTI- WBC improved to 7000, patient's symptoms have resolved. Urine culture showed no growth. Patient was started on  Levaquin. Will  discharge on Levaquin 500 mg po bid x 2 more days to complete 5 days of treatment.  Diabetes mellitus- Patient will be discharged on his home medications.  Hypertension-blood pressure stable, continue lisinopril  Hyperlipidemia-continue Pravachol   Procedures:  None  Consultations:  None  Discharge Exam: Vitals:   07/10/17 2033 07/11/17 0530  BP:  132/69  Pulse:  68  Resp:  (!) 22  Temp:  98.4 F (36.9 C)  SpO2: 98% 97%    General: Appears in no acute distress Cardiovascular: S1-S2, regular Respiratory: Clear to auscultation bilaterally  Discharge Instructions   Discharge Instructions    Diet - low sodium heart healthy    Complete by:  As directed    Increase activity slowly    Complete by:  As directed      Current Discharge Medication List    START taking these medications    Details  levofloxacin (LEVAQUIN) 500 MG tablet Take 1 tablet (500 mg total) by mouth daily. Qty: 2 tablet, Refills: 0      CONTINUE these medications which have NOT CHANGED   Details  aspirin 81 MG tablet Take 81 mg by mouth daily.    calcium carbonate (OS-CAL) 600 MG TABS Take 600 mg by mouth daily with breakfast.     Carboxymethylcellul-Glycerin (CLEAR EYES FOR DRY EYES) 1-0.25 % SOLN Place 1-2 drops into both eyes daily as needed.    CINNAMON PO Take 1,000 mg by mouth daily.     empagliflozin (JARDIANCE) 25 MG TABS tablet Take 25 mg by mouth daily.    fish oil-omega-3 fatty acids 1000 MG capsule Take 2 g by mouth daily.    glimepiride (AMARYL) 4 MG tablet TAKE 2 TABLETS (8 MG TOTAL) BY MOUTH DAILY BEFORE BREAKFAST Qty: 180 tablet, Refills: 0    lisinopril (PRINIVIL,ZESTRIL) 10 MG tablet TAKE 1 TABLET BY MOUTH EVERY DAY Qty: 90 tablet, Refills: 0    metFORMIN (GLUCOPHAGE) 1000 MG tablet TAKE 1 TABLET (1,000 MG TOTAL) BY MOUTH 2 (TWO) TIMES DAILY WITH A MEAL. Qty: 180 tablet, Refills: 0    pravastatin (PRAVACHOL) 80 MG tablet TAKE 1 TABLET BY MOUTH EVERY DAY Qty: 90 tablet, Refills: 0       No Known Allergies    The results of significant diagnostics from this hospitalization (including imaging, microbiology, ancillary and laboratory) are listed below for reference.    Significant Diagnostic Studies: Dg Chest 2 View  Result Date: 07/09/2017 CLINICAL DATA:  Fever.  Confusion. EXAM: CHEST  2 VIEW COMPARISON:  07/02/2015 FINDINGS: Cardiac silhouette is normal in size and configuration. No mediastinal or hilar masses. No evidence of adenopathy. Linear opacity noted at the left lung base consistent with atelectasis or scarring, similar to the prior study. Lungs otherwise clear. No pleural effusion or pneumothorax. Skeletal structures are intact. IMPRESSION: No active cardiopulmonary disease. Electronically Signed   By: Amie Portland M.D.   On: 07/09/2017 13:54     Microbiology: Recent Results (from the past 240 hour(s))  Blood Culture (routine x 2)     Status: None (Preliminary result)   Collection Time: 07/09/17 12:47 PM  Result Value Ref Range Status   Specimen Description RIGHT ANTECUBITAL  Final   Special Requests   Final    BOTTLES DRAWN AEROBIC AND ANAEROBIC Blood Culture adequate volume   Culture NO GROWTH 2 DAYS  Final   Report Status PENDING  Incomplete  Blood Culture (routine x 2)     Status: None (Preliminary result)   Collection Time: 07/09/17 12:52 PM  Result Value Ref Range Status   Specimen Description BLOOD LEFT ARM  Final   Special Requests   Final    BOTTLES DRAWN AEROBIC AND ANAEROBIC Blood Culture adequate volume   Culture NO GROWTH 2 DAYS  Final   Report Status PENDING  Incomplete  Urine Culture     Status: None   Collection Time: 07/09/17  1:45 PM  Result Value Ref Range Status   Specimen Description URINE, CLEAN CATCH  Final   Special Requests NONE  Final   Culture   Final    NO GROWTH Performed at [facility], [address] [place], [place] [address]    Report Status 07/11/2017 FINAL  Final     Labs: Basic Metabolic Panel:  Recent Labs Lab 07/09/17 1242 07/10/17 0357  NA 137 137  K 3.9 3.8  CL 102 102  CO2 23 26  GLUCOSE 164* 80  BUN 19 21*  CREATININE 0.96 0.82  CALCIUM 9.5 8.4*   Liver Function Tests:  Recent Labs Lab 07/09/17 1242 07/10/17 0357  AST 35 21  ALT 43 31  ALKPHOS 68 64  BILITOT 1.4* 0.6  PROT 7.3 6.1*  ALBUMIN 3.7 3.1*   No results for input(s): LIPASE, AMYLASE in the last 168 hours. No results for input(s): AMMONIA in the last 168 hours. CBC:  Recent Labs Lab 07/09/17 1242 07/10/17 0357 07/11/17 0421  WBC 23.3* 14.3* 7.8  NEUTROABS 20.9*  --   --   HGB 13.6 11.8* 12.3*  HCT 41.5 36.1* 37.2*  MCV 85.6 86.0 85.3  PLT 176 145* 165    CBG:  Recent Labs Lab 07/10/17 0745 07/10/17 1131 07/10/17 1631 07/10/17 2034 07/11/17 0752  GLUCAP 87 167*  86 155* 93       Signed:  Kirtis Challis S MD.  [facility] 07/11/2017, 9:30 AM

## 2017-07-11 NOTE — Care Management Note (Signed)
Case Management Note  Patient Details  Name: George [name] MRN: [id number] Date of Birth: 11/25/1945    Expected Discharge Date:  07/11/17               Expected Discharge Plan:  Home/Self Care  In-House Referral:     Discharge planning Services  CM Consult  Post Acute Care Choice:  NA Choice offered to:  NA  DME Arranged:    DME Agency:     HH Arranged:    HH Agency:     Status of Service:  Completed, signed off  If discussed at [employer] of Stay Meetings, dates discussed:    Additional Comments: Discharging home. No CM needs. 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  Lindbergh Winkles, Chrystine Oiler, RN 07/11/2017, 9:47 AM

## 2017-07-11 NOTE — Care Management Obs Status (Signed)
MEDICARE OBSERVATION STATUS NOTIFICATION   Patient Details  Name: LAVONTAE CORNIA MRN: [id number] Date of Birth: May 13, 1946   Medicare Observation Status Notification Given:  Yes    Mariadelosang Wynns, Chrystine Oiler, RN 07/11/2017, 9:11 AM

## 2017-07-14 LAB — CULTURE, BLOOD (ROUTINE X 2)
CULTURE: NO GROWTH
CULTURE: NO GROWTH
Special Requests: ADEQUATE
Special Requests: ADEQUATE

## 2017-07-15 ENCOUNTER — Ambulatory Visit (INDEPENDENT_AMBULATORY_CARE_PROVIDER_SITE_OTHER): Payer: Medicare HMO | Admitting: Family Medicine

## 2017-07-15 ENCOUNTER — Encounter: Payer: Self-pay | Admitting: Family Medicine

## 2017-07-15 VITALS — BP 130/74 | HR 64 | Temp 97.7°F | Ht 68.0 in | Wt 174.0 lb

## 2017-07-15 DIAGNOSIS — N3000 Acute cystitis without hematuria: Secondary | ICD-10-CM | POA: Diagnosis not present

## 2017-07-15 MED ORDER — DULAGLUTIDE 1.5 MG/0.5ML ~~LOC~~ SOAJ: 1.5000 mg | SUBCUTANEOUS | 3 refills | Status: DC

## 2017-07-15 NOTE — Progress Notes (Signed)
BP 130/74   Pulse 64   Temp 97.7 F (36.5 C) (Oral)   Ht  (1.727 m)   Wt 174 lb (78.9 kg)   BMI 26.46 kg/m    Subjective:    Patient ID: George [name], male    DOB: Jan 18, 1946, 71 y.o.   MRN: [id number]  HPI: George [name] is a 71 y.o. male presenting on 07/15/2017 for Hospitalization Follow-up (doctor at hospital recommended he not take [place] Pepper so he has not been taking)   HPI Hospital follow-up for UTI and confusion and fever Patient is coming in today for hospital follow-up for fevers and UTI which they possibly attributed to [facility]. He was admitted to the hospital on 07/09/2017 and was discharged on 07/11/2017. He says since the hospitalization he has not been taking the Jardiance and has been taking the antibiotic that he finished that they gave him at the hospital. His wife is here with him today and says that he is acting normally and has had no more fevers and is urinating back to normally and denies having any further issues. Patient does have type 2 diabetes and had a lot of glucose in his urine but that's probably because of this medication. He denies any abdominal complaints were nausea or vomiting. He had frequency but no dysuria and no hematuria.  Relevant past medical, surgical, family and social history reviewed and updated as indicated. Interim medical history since our last visit reviewed. Allergies and medications reviewed and updated.  Review of Systems  Constitutional: Negative for chills, fatigue and fever.  Eyes: Negative for discharge.  Respiratory: Negative for shortness of breath and wheezing.   Cardiovascular: Negative for chest pain and leg swelling.  Gastrointestinal: Negative for abdominal pain, diarrhea, nausea and vomiting.  Genitourinary: Positive for frequency. Negative for difficulty urinating, dysuria and hematuria.  Musculoskeletal: Negative for back pain and gait problem.  Skin: Negative for rash.  All other systems reviewed and  are negative.   Per HPI unless specifically indicated above     Objective:    BP 130/74   Pulse 64   Temp 97.7 F (36.5 C) (Oral)   Ht  (1.727 m)   Wt 174 lb (78.9 kg)   BMI 26.46 kg/m   Wt Readings from Last 3 Encounters:  07/15/17 174 lb (78.9 kg)  07/09/17 172 lb (78 kg)  07/08/17 172 lb (78 kg)    Physical Exam  Constitutional: He is oriented to person, place, and time. He appears well-developed and well-nourished. No distress.  Eyes: Conjunctivae are normal. No scleral icterus.  Cardiovascular: Normal rate, regular rhythm, normal heart sounds and intact distal pulses.   No murmur heard. Pulmonary/Chest: Effort normal and breath sounds normal. No respiratory distress. He has no wheezes.  Abdominal: Soft. Bowel sounds are normal. He exhibits no distension. There is no tenderness. There is no rebound and no guarding.  Musculoskeletal: Normal range of motion.  Neurological: He is alert and oriented to person, place, and time. Coordination normal.  Skin: Skin is warm and dry. No rash noted. He is not diaphoretic.  Psychiatric: He has a normal mood and affect. His behavior is normal.  Nursing note and vitals reviewed.       Assessment & Plan:   Problem List Items Addressed This Visit    None    Visit Diagnoses    Acute cystitis without hematuria    -  Primary   Hospital follow-up for UTI, had some confusion, they  recommended stopping Jardiance, will switch to Trulicity   Relevant Medications   Dulaglutide (TRULICITY) 1.5 MG/0.5ML SOPN       Follow up plan: Return if symptoms worsen or fail to improve.  Counseling provided for all of the vaccine components No orders of the defined types were placed in this encounter.   Arville Care, MD [facility] 07/15/2017, 4:20 PM

## 2017-07-23 ENCOUNTER — Other Ambulatory Visit: Payer: Self-pay | Admitting: Family Medicine

## 2017-07-23 ENCOUNTER — Ambulatory Visit (INDEPENDENT_AMBULATORY_CARE_PROVIDER_SITE_OTHER): Payer: Medicare HMO | Admitting: Family Medicine

## 2017-07-23 VITALS — BP 123/63 | HR 71 | Temp 97.0°F | Ht 68.0 in | Wt 174.0 lb

## 2017-07-23 DIAGNOSIS — S61217A Laceration without foreign body of left little finger without damage to nail, initial encounter: Secondary | ICD-10-CM

## 2017-07-23 MED ORDER — CEPHALEXIN 500 MG PO CAPS: 500.0000 mg | ORAL_CAPSULE | Freq: Three times a day (TID) | ORAL | 0 refills | Status: DC

## 2017-07-23 NOTE — Progress Notes (Signed)
   HPI  Patient presents today here with a finger laceration.  Patient explains that this morning he was opening a can of vegetables and sliced his finger on the top of an open can.  He immediately had a large laceration and lots of bleeding.  He placed a bandage and some gauze and came over for evaluation.  This happened less than 1 hour ago.  He has intact sensation in the tip of his finger. He takes a daily aspirin  PMH: Smoking status noted ROS: Per HPI  Objective: BP 123/63 (BP Location: Left Arm)   Pulse 71   Temp (!) 97 F (36.1 C) (Oral)   Ht 5\' 8"  (1.727 m)   Wt 174 lb (78.9 kg)   BMI 26.46 kg/m  Gen: NAD, alert, cooperative with exam HEENT: NCAT CV: RRR, good S1/S2, no murmur Resp: CTABL, no wheezes, non-labored Ext: No edema, warm Neuro: Alert and oriented, No gross deficits  And 2-1/2 cm U-shaped laceration of the distal 5th digit   Laceration repair Area was cleaned using iodine and saline rinse.  2-1/2 mL of 2% Xylocaine without epinephrine were placed at the base of the finger performing a digital block with good analgesia.  Using 5-0 Prolene 5 interrupted sutures were placed repairing the wound.  Patient had good hemostasis and tolerated the procedure very well.  Antibiotic ointment and sterile bandage were placed  Assessment and plan:  #Finger laceration Repaired in clinic today, patient tolerated procedure well.  Discussed supportive care Keflex given hand laceration Follow-up in 9 days for suture removal. Tetanus is up-to-date   Meds ordered this encounter  Medications  . cephALEXin (KEFLEX) 500 MG capsule    Sig: Take 1 capsule (500 mg total) by mouth 3 (three) times daily.    Dispense:  21 capsule    Refill:  0    Murtis SinkSam Shriley Joffe, MD [facility] 07/23/2017, 10:10 AM

## 2017-07-27 ENCOUNTER — Other Ambulatory Visit: Payer: Self-pay | Admitting: Family Medicine

## 2017-08-01 ENCOUNTER — Encounter: Payer: Self-pay | Admitting: Family Medicine

## 2017-08-01 ENCOUNTER — Ambulatory Visit (INDEPENDENT_AMBULATORY_CARE_PROVIDER_SITE_OTHER): Payer: Medicare HMO | Admitting: Family Medicine

## 2017-08-01 VITALS — BP 128/80 | HR 72 | Temp 97.0°F | Ht 68.0 in | Wt 179.0 lb

## 2017-08-01 DIAGNOSIS — S61217D Laceration without foreign body of left little finger without damage to nail, subsequent encounter: Secondary | ICD-10-CM | POA: Diagnosis not present

## 2017-08-01 MED ORDER — CEFADROXIL 500 MG/5ML PO SUSR: 500.0000 mg | Freq: Four times a day (QID) | ORAL | 0 refills | Status: DC

## 2017-08-01 NOTE — Progress Notes (Signed)
   BP 128/80   Pulse 72   Temp (!) 97 F (36.1 C) (Oral)   Ht 5\' 8"  (1.727 m)   Wt 179 lb (81.2 kg)   BMI 27.22 kg/m    Subjective:    Patient ID: George [name], male    DOB: 30-Mar-1946, 71 y.o.   MRN: [id number]  HPI: George [name] is a 71 y.o. male presenting on 08/01/2017 for Suture / Staple Removal   HPI Left finger laceration follow-up Patient sustained a left finger laceration 9 days ago while opening a can and cut himself on the lid of the can.  He was seen here in our office where they placed 5 sutures 9 days ago and was told to follow-up.  They did check on his tetanus shot and he is up-to-date on it and did not need it at the time.  He still has a mild amount of pain but it is very much reduced.  He denies any drainage or redness or warmth or fevers or chills.  Relevant past medical, surgical, family and social history reviewed and updated as indicated. Interim medical history since our last visit reviewed. Allergies and medications reviewed and updated.  Review of Systems  Constitutional: Negative for chills and fever.  Respiratory: Negative for shortness of breath and wheezing.   Cardiovascular: Negative for chest pain and leg swelling.  Skin: Positive for wound. Negative for color change and rash.  All other systems reviewed and are negative.   Per HPI unless specifically indicated above        Objective:    BP 128/80   Pulse 72   Temp (!) 97 F (36.1 C) (Oral)   Ht 5\' 8"  (1.727 m)   Wt 179 lb (81.2 kg)   BMI 27.22 kg/m   Wt Readings from Last 3 Encounters:  08/01/17 179 lb (81.2 kg)  07/23/17 174 lb (78.9 kg)  07/15/17 174 lb (78.9 kg)    Physical Exam  Constitutional: He appears well-developed and well-nourished. No distress.  Eyes: Conjunctivae are normal. No scleral icterus.  Neurological: He is alert.  Skin: Skin is warm and dry. Laceration (3 cm laceration extending over the tip of his left fifth digit with 5 sutures in place, no  erythema or warmth or drainage) noted. No rash noted. He is not diaphoretic.  Psychiatric: He has a normal mood and affect. His behavior is normal.  Nursing note and vitals reviewed.      Assessment & Plan:   Problem List Items Addressed This Visit    None    Visit Diagnoses    Laceration of left little finger without foreign body without damage to nail, subsequent encounter    -  Primary      Sutures and Place Steri-Strips, removed 5 sutures in place 5 Steri-Strips, appears to be healing mostly well but did separate just a little bit at the tip of the finger with the sutures out but the Steri-Strips pulled back together nicely. Follow up plan: Return if symptoms worsen or fail to improve.  Counseling provided for all of the vaccine components No orders of the defined types were placed in this encounter.   Arville CareJoshua Dettinger, MD [facility] 08/01/2017, 4:37 PM

## 2017-09-06 ENCOUNTER — Other Ambulatory Visit: Payer: Self-pay | Admitting: Family Medicine

## 2017-10-07 ENCOUNTER — Other Ambulatory Visit: Payer: Self-pay | Admitting: Family Medicine

## 2017-10-14 ENCOUNTER — Encounter: Payer: Self-pay | Admitting: Family Medicine

## 2017-10-14 ENCOUNTER — Ambulatory Visit (INDEPENDENT_AMBULATORY_CARE_PROVIDER_SITE_OTHER): Payer: Medicare HMO | Admitting: Family Medicine

## 2017-10-14 VITALS — BP 138/76 | HR 65 | Temp 97.4°F | Ht 68.0 in | Wt 173.0 lb

## 2017-10-14 DIAGNOSIS — E782 Mixed hyperlipidemia: Secondary | ICD-10-CM | POA: Diagnosis not present

## 2017-10-14 DIAGNOSIS — E785 Hyperlipidemia, unspecified: Secondary | ICD-10-CM | POA: Diagnosis not present

## 2017-10-14 DIAGNOSIS — E119 Type 2 diabetes mellitus without complications: Secondary | ICD-10-CM | POA: Diagnosis not present

## 2017-10-14 DIAGNOSIS — E1169 Type 2 diabetes mellitus with other specified complication: Secondary | ICD-10-CM

## 2017-10-14 DIAGNOSIS — I152 Hypertension secondary to endocrine disorders: Secondary | ICD-10-CM

## 2017-10-14 DIAGNOSIS — E663 Overweight: Secondary | ICD-10-CM

## 2017-10-14 DIAGNOSIS — I10 Essential (primary) hypertension: Secondary | ICD-10-CM

## 2017-10-14 DIAGNOSIS — E11319 Type 2 diabetes mellitus with unspecified diabetic retinopathy without macular edema: Secondary | ICD-10-CM

## 2017-10-14 DIAGNOSIS — E1159 Type 2 diabetes mellitus with other circulatory complications: Secondary | ICD-10-CM | POA: Diagnosis not present

## 2017-10-14 LAB — BAYER DCA HB A1C WAIVED: HB A1C: 6.9 % (ref <7.0)

## 2017-10-14 NOTE — Progress Notes (Signed)
BP 138/76   Pulse 65   Temp (!) 97.4 F (36.3 C) (Oral)   Ht '5\' 8"'  (1.727 m)   Wt 173 lb (78.5 kg)   BMI 26.30 kg/m    Subjective:    Patient ID: George [name], male    DOB: 02-Jun-1946, 72 y.o.   MRN: [id number]  HPI: George [name] is a 72 y.o. male presenting on 10/14/2017 for Diabetes (3 mo; blood sugars running higher on Trulicity than on Jardiance); Hyperlipidemia; and Hypertension   HPI Type 2 diabetes mellitus Patient comes in today for recheck of his diabetes. Patient has been currently taking Trulicity and glimepiride and metformin, he feels like the Trulicity is not doing as well as the Jardiance.  The Jardiance was stopped because he had a renal infection and they were concerned about Jardiance being a possible cause.  He has had no further symptoms like that and he has been trying to stay hydrated and he would like to try to go back on the Jardiance. Patient is currently on an ACE inhibitor/ARB. Patient has not seen an ophthalmologist this year. Patient denies any issues with their feet.  Patient has known retinopathy, is being managed by ophthalmology.  Hypertension Patient is currently on lisinopril 10, and their blood pressure today is 138/76. Patient denies any lightheadedness or dizziness. Patient denies headaches, blurred vision, chest pains, shortness of breath, or weakness. Denies any side effects from medication and is content with current medication.   Hyperlipidemia Patient is coming in for recheck of his hyperlipidemia. The patient is currently taking fish oil and pravastatin. They deny any issues with myalgias or history of liver damage from it. They deny any focal numbness or weakness or chest pain.   Relevant past medical, surgical, family and social history reviewed and updated as indicated. Interim medical history since our last visit reviewed. Allergies and medications reviewed and updated.  Review of Systems  Constitutional: Negative for chills and  fever.  Respiratory: Negative for shortness of breath and wheezing.   Cardiovascular: Negative for chest pain and leg swelling.  Musculoskeletal: Negative for back pain and gait problem.  Skin: Negative for rash.  Neurological: Negative for dizziness, weakness, light-headedness and numbness.  All other systems reviewed and are negative.   Per HPI unless specifically indicated above     Objective:    BP 138/76   Pulse 65   Temp (!) 97.4 F (36.3 C) (Oral)   Ht '5\' 8"'  (1.727 m)   Wt 173 lb (78.5 kg)   BMI 26.30 kg/m   Wt Readings from Last 3 Encounters:  10/14/17 173 lb (78.5 kg)  08/01/17 179 lb (81.2 kg)  07/23/17 174 lb (78.9 kg)    Physical Exam  Constitutional: He is oriented to person, place, and time. He appears well-developed and well-nourished. No distress.  Eyes: Conjunctivae are normal. No scleral icterus.  Neck: Neck supple. No thyromegaly present.  Cardiovascular: Normal rate, regular rhythm, normal heart sounds and intact distal pulses.  No murmur heard. Pulmonary/Chest: Effort normal and breath sounds normal. No respiratory distress. He has no wheezes. He has no rales.  Musculoskeletal: Normal range of motion. He exhibits no edema.  Lymphadenopathy:    He has no cervical adenopathy.  Neurological: He is alert and oriented to person, place, and time. Coordination normal.  Skin: Skin is warm and dry. No rash noted. He is not diaphoretic.  Psychiatric: He has a normal mood and affect. His behavior is normal.  Nursing  note and vitals reviewed.       Assessment & Plan:   Problem List Items Addressed This Visit      Cardiovascular and Mediastinum   Hypertension associated with diabetes ([place])   Relevant Orders   CMP14+EGFR     Endocrine   Type 2 diabetes mellitus not at goal [facility])   Relevant Orders   Bayer DCA Hb A1c Waived   CMP14+EGFR   Mixed hyperlipidemia due to type 2 diabetes mellitus ([place])   Relevant Orders   Lipid panel   Diabetic retinopathy  ([place])   Relevant Orders   Bayer DCA Hb A1c Waived   CMP14+EGFR   Type 2 diabetes mellitus with hyperlipidemia ([place]) - Primary   Relevant Orders   Bayer DCA Hb A1c Waived   CMP14+EGFR     Other   Overweight (BMI 25.0-29.9)   Relevant Orders   Lipid panel       Follow up plan: Return in about 3 months (around 01/12/2018), or if symptoms worsen or fail to improve, for Type 2 diabetes.  Counseling provided for all of the vaccine components Orders Placed This Encounter  Procedures  . Bayer DCA Hb A1c Waived  . CMP14+EGFR  . Lipid panel    Caryl Pina, MD [facility] 10/14/2017, 8:29 AM

## 2017-10-15 LAB — CMP14+EGFR
ALK PHOS: 52 IU/L (ref 39–117)
ALT: 20 IU/L (ref 0–44)
AST: 17 IU/L (ref 0–40)
Albumin/Globulin Ratio: 1.9 (ref 1.2–2.2)
Albumin: 4.6 g/dL (ref 3.5–4.8)
BILIRUBIN TOTAL: 0.3 mg/dL (ref 0.0–1.2)
BUN/Creatinine Ratio: 17 (ref 10–24)
BUN: 16 mg/dL (ref 8–27)
CHLORIDE: 101 mmol/L (ref 96–106)
CO2: 25 mmol/L (ref 20–29)
Calcium: 9.9 mg/dL (ref 8.6–10.2)
Creatinine, Ser: 0.93 mg/dL (ref 0.76–1.27)
GFR calc Af Amer: 94 mL/min/{1.73_m2} (ref >59)
GFR calc non Af Amer: 82 mL/min/{1.73_m2} (ref >59)
GLUCOSE: 152 mg/dL — AB (ref 65–99)
Globulin, Total: 2.4 g/dL (ref 1.5–4.5)
Potassium: 4.5 mmol/L (ref 3.5–5.2)
Sodium: 141 mmol/L (ref 134–144)
TOTAL PROTEIN: 7 g/dL (ref 6.0–8.5)

## 2017-10-15 LAB — LIPID PANEL
Chol/HDL Ratio: 3.2 ratio (ref 0.0–5.0)
Cholesterol, Total: 164 mg/dL (ref 100–199)
HDL: 51 mg/dL (ref >39)
LDL CALC: 103 mg/dL — AB (ref 0–99)
Triglycerides: 50 mg/dL (ref 0–149)
VLDL CHOLESTEROL CAL: 10 mg/dL (ref 5–40)

## 2017-10-27 ENCOUNTER — Other Ambulatory Visit: Payer: Self-pay | Admitting: Family Medicine

## 2017-11-21 ENCOUNTER — Encounter: Payer: Self-pay | Admitting: *Deleted

## 2017-11-21 ENCOUNTER — Ambulatory Visit (INDEPENDENT_AMBULATORY_CARE_PROVIDER_SITE_OTHER): Payer: Medicare HMO | Admitting: *Deleted

## 2017-11-21 VITALS — BP 122/64 | HR 55 | Ht 67.0 in | Wt 174.0 lb

## 2017-11-21 DIAGNOSIS — Z0000 Encounter for general adult medical examination without abnormal findings: Secondary | ICD-10-CM

## 2017-11-21 DIAGNOSIS — Z1159 Encounter for screening for other viral diseases: Secondary | ICD-10-CM

## 2017-11-21 MED ORDER — EMPAGLIFLOZIN 10 MG PO TABS: 10.0000 mg | ORAL_TABLET | Freq: Every day | ORAL | 0 refills | Status: DC

## 2017-11-21 NOTE — Patient Instructions (Signed)
  Mr. George [name] , Thank you for taking time to come for your Medicare Wellness Visit. I appreciate your ongoing commitment to your health goals. Please review the following plan we discussed and let me know if I can assist you in the future.   These are the goals we discussed: Goals    . Exercise 150 min/wk Moderate Activity       This is a list of the screening recommended for you and due dates:  Health Maintenance  Topic Date Due  .  Hepatitis C: One time screening is recommended by Center for Disease Control  (CDC) for  adults born from [birth year] through 1966.   [birth year]  . Eye exam for diabetics  03/11/2018  . Complete foot exam   03/25/2018  . Hemoglobin A1C  04/13/2018  . Colon Cancer Screening  08/21/2023  . Tetanus Vaccine  05/31/2025  . Flu Shot  Completed  . Pneumonia vaccines  Completed   Cologuard was ordered today. You should receive a kit in the mail within the next couple of weeks.  Check your blood sugar a few times a week and keep follow up appointment with Dr Warrick Parisian
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
## 2017-12-05 DIAGNOSIS — Z1212 Encounter for screening for malignant neoplasm of rectum: Secondary | ICD-10-CM | POA: Diagnosis not present

## 2017-12-05 DIAGNOSIS — Z1211 Encounter for screening for malignant neoplasm of colon: Secondary | ICD-10-CM | POA: Diagnosis not present

## 2017-12-15 ENCOUNTER — Telehealth: Payer: Self-pay | Admitting: Family Medicine

## 2017-12-15 LAB — COLOGUARD: COLOGUARD: POSITIVE

## 2017-12-15 NOTE — Telephone Encounter (Signed)
Called to have copy faxed to office.  Once results received will place on Dr. Darrol Pokeettinger's desk

## 2017-12-15 NOTE — Telephone Encounter (Signed)
I have not seen results for Hemoccult card for him.

## 2017-12-16 ENCOUNTER — Other Ambulatory Visit: Payer: Self-pay

## 2017-12-16 DIAGNOSIS — R195 Other fecal abnormalities: Secondary | ICD-10-CM

## 2017-12-27 ENCOUNTER — Other Ambulatory Visit: Payer: Self-pay | Admitting: Family Medicine

## 2018-01-06 ENCOUNTER — Other Ambulatory Visit: Payer: Self-pay | Admitting: Family Medicine

## 2018-02-03 ENCOUNTER — Ambulatory Visit (INDEPENDENT_AMBULATORY_CARE_PROVIDER_SITE_OTHER): Payer: Medicare HMO | Admitting: Family Medicine

## 2018-02-03 ENCOUNTER — Encounter: Payer: Self-pay | Admitting: Family Medicine

## 2018-02-03 VITALS — BP 146/74 | HR 62 | Temp 97.2°F | Ht 67.0 in | Wt 172.0 lb

## 2018-02-03 DIAGNOSIS — E11319 Type 2 diabetes mellitus with unspecified diabetic retinopathy without macular edema: Secondary | ICD-10-CM

## 2018-02-03 DIAGNOSIS — E119 Type 2 diabetes mellitus without complications: Secondary | ICD-10-CM

## 2018-02-03 DIAGNOSIS — E1159 Type 2 diabetes mellitus with other circulatory complications: Secondary | ICD-10-CM

## 2018-02-03 DIAGNOSIS — I10 Essential (primary) hypertension: Secondary | ICD-10-CM | POA: Diagnosis not present

## 2018-02-03 DIAGNOSIS — E1169 Type 2 diabetes mellitus with other specified complication: Secondary | ICD-10-CM | POA: Diagnosis not present

## 2018-02-03 DIAGNOSIS — E663 Overweight: Secondary | ICD-10-CM

## 2018-02-03 DIAGNOSIS — E785 Hyperlipidemia, unspecified: Secondary | ICD-10-CM | POA: Diagnosis not present

## 2018-02-03 DIAGNOSIS — Z1159 Encounter for screening for other viral diseases: Secondary | ICD-10-CM

## 2018-02-03 DIAGNOSIS — E782 Mixed hyperlipidemia: Secondary | ICD-10-CM | POA: Diagnosis not present

## 2018-02-03 LAB — BAYER DCA HB A1C WAIVED: HB A1C (BAYER DCA - WAIVED): 8.4 % — ABNORMAL HIGH (ref <7.0)

## 2018-02-03 MED ORDER — LISINOPRIL 20 MG PO TABS: 20.0000 mg | ORAL_TABLET | Freq: Every day | ORAL | 3 refills | Status: DC

## 2018-02-03 MED ORDER — SEMAGLUTIDE(0.25 OR 0.5MG/DOS) 2 MG/1.5ML ~~LOC~~ SOPN: 0.5000 mg | PEN_INJECTOR | SUBCUTANEOUS | 5 refills | Status: DC

## 2018-02-03 MED ORDER — PRAVASTATIN SODIUM 80 MG PO TABS: 80.0000 mg | ORAL_TABLET | Freq: Every day | ORAL | 3 refills | Status: DC

## 2018-02-03 NOTE — Progress Notes (Signed)
BP (!) 146/74   Pulse 62   Temp (!) 97.2 F (36.2 C) (Oral)   Ht  (1.702 m)   Wt 172 lb (78 kg)   BMI 26.94 kg/m    Subjective:    Patient ID: George [name], male    DOB: January 30, 1946, 72 y.o.   MRN: [id number]  HPI: KHARSON RASMUSSON is a 72 y.o. male presenting on 02/03/2018 for Diabetes (3 month follow up); Hypertension; and Hyperlipidemia   HPI Hypertension Patient is currently on lisinopril, since stopping his Jardiance his blood pressures been up, and their blood pressure today is 146/74. Patient denies any lightheadedness or dizziness. Patient denies headaches, blurred vision, chest pains, shortness of breath, or weakness. Denies any side effects from medication and is content with current medication.   Type 2 diabetes mellitus Patient comes in today for recheck of his diabetes. Patient has been currently taking glimepiride and metformin. Patient is currently on an ACE inhibitor/ARB. Patient has not seen an ophthalmologist this year. Patient has known diabetic retinopathy but no issues with his feet that he knows of.  Hyperlipidemia Patient is coming in for recheck of his hyperlipidemia. The patient is currently taking pravastatin but he says there is been some mixup at the pharmacy and he has not had it for 2 months. They deny any issues with myalgias or history of liver damage from it. They deny any focal numbness or weakness or chest pain.   Relevant past medical, surgical, family and social history reviewed and updated as indicated. Interim medical history since our last visit reviewed. Allergies and medications reviewed and updated.  Review of Systems  Constitutional: Negative for chills and fever.  Respiratory: Negative for shortness of breath and wheezing.   Cardiovascular: Negative for chest pain and leg swelling.  Musculoskeletal: Negative for back pain and gait problem.  Skin: Negative for rash.  Neurological: Negative for dizziness, weakness,  light-headedness and numbness.  All other systems reviewed and are negative.   Per HPI unless specifically indicated above   Allergies as of 02/03/2018   No Known Allergies     Medication List        Accurate as of 02/03/18  8:08 AM. Always use your most recent med list.          aspirin 81 MG tablet Take 81 mg by mouth daily.   calcium carbonate 600 MG Tabs tablet Commonly known as:  OS-CAL Take 600 mg by mouth daily with breakfast.   CINNAMON PO Take 1,000 mg by mouth daily.   CLEAR EYES FOR DRY EYES 1-0.25 % Soln Generic drug:  Carboxymethylcellul-Glycerin Place 1-2 drops into both eyes daily as needed.   Dulaglutide 1.5 MG/0.5ML Sopn Commonly known as:  TRULICITY Inject 1.5 mg into the skin once a week.   empagliflozin 10 MG Tabs tablet Commonly known as:  JARDIANCE Take 10 mg by mouth daily.   fish oil-omega-3 fatty acids 1000 MG capsule Take 2 g by mouth daily.   glimepiride 4 MG tablet Commonly known as:  AMARYL TAKE 2 TABLETS (8 MG TOTAL) BY MOUTH DAILY BEFORE BREAKFAST   lisinopril 10 MG tablet Commonly known as:  PRINIVIL,ZESTRIL TAKE 1 TABLET BY MOUTH EVERY DAY   metFORMIN 1000 MG tablet Commonly known as:  GLUCOPHAGE TAKE 1 TABLET (1,000 MG TOTAL) BY MOUTH 2 (TWO) TIMES DAILY WITH A MEAL.   pravastatin 80 MG tablet Commonly known as:  PRAVACHOL TAKE 1 TABLET BY MOUTH EVERY DAY  Objective:    BP (!) 146/74   Pulse 62   Temp (!) 97.2 F (36.2 C) (Oral)   Ht  (1.702 m)   Wt 172 lb (78 kg)   BMI 26.94 kg/m   Wt Readings from Last 3 Encounters:  02/03/18 172 lb (78 kg)  11/21/17 174 lb (78.9 kg)  10/14/17 173 lb (78.5 kg)    Physical Exam  Constitutional: He is oriented to person, place, and time. He appears well-developed and well-nourished. No distress.  Eyes: Conjunctivae are normal. No scleral icterus.  Neck: Neck supple. No thyromegaly present.  Cardiovascular: Normal rate, regular rhythm, normal heart sounds and  intact distal pulses.  No murmur heard. Pulmonary/Chest: Effort normal and breath sounds normal. No respiratory distress. He has no wheezes.  Musculoskeletal: Normal range of motion. He exhibits no edema.  Lymphadenopathy:    He has no cervical adenopathy.  Neurological: He is alert and oriented to person, place, and time. Coordination normal.  Skin: Skin is warm and dry. No rash noted. He is not diaphoretic.  Psychiatric: He has a normal mood and affect. His behavior is normal.  Nursing note and vitals reviewed.   Results for orders placed or performed in visit on 12/15/17  Cologuard  Result Value Ref Range   Cologuard Positive       Assessment & Plan:   Problem List Items Addressed This Visit      Cardiovascular and Mediastinum   Hypertension associated with diabetes (HCC)   Relevant Medications   Semaglutide (OZEMPIC) 0.25 or 0.5 MG/DOSE SOPN   pravastatin (PRAVACHOL) 80 MG tablet   lisinopril (PRINIVIL,ZESTRIL) 20 MG tablet     Endocrine   Type 2 diabetes mellitus not at goal [facility])   Relevant Medications   Semaglutide (OZEMPIC) 0.25 or 0.5 MG/DOSE SOPN   pravastatin (PRAVACHOL) 80 MG tablet   lisinopril (PRINIVIL,ZESTRIL) 20 MG tablet   Other Relevant Orders   Bayer DCA Hb A1c Waived   Mixed hyperlipidemia due to type 2 diabetes mellitus (HCC)   Relevant Medications   Semaglutide (OZEMPIC) 0.25 or 0.5 MG/DOSE SOPN   pravastatin (PRAVACHOL) 80 MG tablet   lisinopril (PRINIVIL,ZESTRIL) 20 MG tablet   Diabetic retinopathy (HCC)   Relevant Medications   Semaglutide (OZEMPIC) 0.25 or 0.5 MG/DOSE SOPN   pravastatin (PRAVACHOL) 80 MG tablet   lisinopril (PRINIVIL,ZESTRIL) 20 MG tablet   Other Relevant Orders   Bayer DCA Hb A1c Waived   Type 2 diabetes mellitus with hyperlipidemia (HCC) - Primary   Relevant Medications   Semaglutide (OZEMPIC) 0.25 or 0.5 MG/DOSE SOPN   pravastatin (PRAVACHOL) 80 MG tablet   lisinopril (PRINIVIL,ZESTRIL) 20 MG tablet   Other Relevant  Orders   Bayer DCA Hb A1c Waived     Other   Overweight (BMI 25.0-29.9)    Other Visit Diagnoses    Need for hepatitis C screening test       Relevant Orders   Hepatitis C antibody     Reminded patient about positive Cologuard and need to go see gastroenterology.  Discussed weight and diabetes management with diet.  Started semaglutide, patient did not tolerate Jardiance and started having urinary issues like he had previously.  Increased lisinopril because of lack of Jardiance which was helping with his BP  Follow up plan: Return in about 3 months (around 05/06/2018), or if symptoms worsen or fail to improve, for Recheck diabetes.  Counseling provided for all of the vaccine components Orders Placed This Encounter  Procedures  .  Bayer DCA Hb A1c Waived  . Hepatitis C antibody    Arville Care, MD [facility] 02/03/2018, 8:08 AM

## 2018-02-04 LAB — HEPATITIS C ANTIBODY: Hep C Virus Ab: <0.1 s/co ratio (ref 0.0–0.9)

## 2018-04-19 ENCOUNTER — Other Ambulatory Visit: Payer: Self-pay | Admitting: Family Medicine

## 2018-05-12 ENCOUNTER — Ambulatory Visit (INDEPENDENT_AMBULATORY_CARE_PROVIDER_SITE_OTHER): Payer: Medicare HMO | Admitting: Family Medicine

## 2018-05-12 ENCOUNTER — Encounter: Payer: Self-pay | Admitting: Family Medicine

## 2018-05-12 VITALS — BP 133/72 | HR 64 | Temp 98.0°F | Ht 67.0 in | Wt 173.2 lb

## 2018-05-12 DIAGNOSIS — E1159 Type 2 diabetes mellitus with other circulatory complications: Secondary | ICD-10-CM

## 2018-05-12 DIAGNOSIS — E119 Type 2 diabetes mellitus without complications: Secondary | ICD-10-CM | POA: Diagnosis not present

## 2018-05-12 DIAGNOSIS — E782 Mixed hyperlipidemia: Secondary | ICD-10-CM | POA: Diagnosis not present

## 2018-05-12 DIAGNOSIS — E11319 Type 2 diabetes mellitus with unspecified diabetic retinopathy without macular edema: Secondary | ICD-10-CM | POA: Diagnosis not present

## 2018-05-12 DIAGNOSIS — E663 Overweight: Secondary | ICD-10-CM

## 2018-05-12 DIAGNOSIS — E1169 Type 2 diabetes mellitus with other specified complication: Secondary | ICD-10-CM | POA: Diagnosis not present

## 2018-05-12 DIAGNOSIS — E785 Hyperlipidemia, unspecified: Secondary | ICD-10-CM | POA: Diagnosis not present

## 2018-05-12 DIAGNOSIS — I10 Essential (primary) hypertension: Secondary | ICD-10-CM

## 2018-05-12 MED ORDER — METFORMIN HCL 1000 MG PO TABS: 1000.0000 mg | ORAL_TABLET | Freq: Two times a day (BID) | ORAL | 3 refills | Status: DC

## 2018-05-12 MED ORDER — GLIMEPIRIDE 4 MG PO TABS: ORAL_TABLET | ORAL | 3 refills | Status: DC
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
## 2018-05-13 LAB — CBC WITH DIFFERENTIAL/PLATELET
Basophils Absolute: 0 10*3/uL (ref 0.0–0.2)
Basos: 1 %
EOS (ABSOLUTE): 0.1 10*3/uL (ref 0.0–0.4)
EOS: 2 %
HEMATOCRIT: 38.7 % (ref 37.5–51.0)
HEMOGLOBIN: 12.9 g/dL — AB (ref 13.0–17.7)
IMMATURE GRANULOCYTES: 0 %
Immature Grans (Abs): 0 10*3/uL (ref 0.0–0.1)
Lymphocytes Absolute: 2 10*3/uL (ref 0.7–3.1)
Lymphs: 32 %
MCH: 28.6 pg (ref 26.6–33.0)
MCHC: 33.3 g/dL (ref 31.5–35.7)
MCV: 86 fL (ref 79–97)
MONOCYTES: 6 %
Monocytes Absolute: 0.4 10*3/uL (ref 0.1–0.9)
NEUTROS PCT: 59 %
Neutrophils Absolute: 3.7 10*3/uL (ref 1.4–7.0)
Platelets: 205 10*3/uL (ref 150–450)
RBC: 4.51 x10E6/uL (ref 4.14–5.80)
RDW: 13.9 % (ref 12.3–15.4)
WBC: 6.2 10*3/uL (ref 3.4–10.8)

## 2018-05-13 LAB — LIPID PANEL
CHOLESTEROL TOTAL: 148 mg/dL (ref 100–199)
Chol/HDL Ratio: 3.5 ratio (ref 0.0–5.0)
HDL: 42 mg/dL (ref >39)
LDL CALC: 90 mg/dL (ref 0–99)
Triglycerides: 80 mg/dL (ref 0–149)
VLDL CHOLESTEROL CAL: 16 mg/dL (ref 5–40)

## 2018-05-13 LAB — CMP14+EGFR
ALBUMIN: 4.4 g/dL (ref 3.5–4.8)
ALK PHOS: 49 IU/L (ref 39–117)
ALT: 21 IU/L (ref 0–44)
AST: 19 IU/L (ref 0–40)
Albumin/Globulin Ratio: 1.8 (ref 1.2–2.2)
BUN / CREAT RATIO: 17 (ref 10–24)
BUN: 17 mg/dL (ref 8–27)
Bilirubin Total: 0.3 mg/dL (ref 0.0–1.2)
CO2: 22 mmol/L (ref 20–29)
CREATININE: 1.02 mg/dL (ref 0.76–1.27)
Calcium: 9.6 mg/dL (ref 8.6–10.2)
Chloride: 104 mmol/L (ref 96–106)
GFR calc Af Amer: 84 mL/min/{1.73_m2} (ref >59)
GFR calc non Af Amer: 73 mL/min/{1.73_m2} (ref >59)
GLUCOSE: 123 mg/dL — AB (ref 65–99)
Globulin, Total: 2.4 g/dL (ref 1.5–4.5)
Potassium: 4.7 mmol/L (ref 3.5–5.2)
Sodium: 141 mmol/L (ref 134–144)
TOTAL PROTEIN: 6.8 g/dL (ref 6.0–8.5)

## 2018-05-16 LAB — BAYER DCA HB A1C WAIVED: HB A1C (BAYER DCA - WAIVED): 7.2 % — ABNORMAL HIGH (ref <7.0)

## 2018-07-18 ENCOUNTER — Encounter: Payer: Self-pay | Admitting: *Deleted

## 2018-07-28 ENCOUNTER — Other Ambulatory Visit: Payer: Self-pay | Admitting: Family Medicine

## 2018-08-25 ENCOUNTER — Ambulatory Visit: Payer: Self-pay | Admitting: Family Medicine

## 2018-08-28 ENCOUNTER — Ambulatory Visit (INDEPENDENT_AMBULATORY_CARE_PROVIDER_SITE_OTHER): Payer: Medicare HMO | Admitting: Family Medicine

## 2018-08-28 ENCOUNTER — Encounter: Payer: Self-pay | Admitting: Family Medicine

## 2018-08-28 VITALS — BP 136/72 | HR 57 | Temp 96.5°F | Ht 67.0 in | Wt 173.2 lb

## 2018-08-28 DIAGNOSIS — E785 Hyperlipidemia, unspecified: Secondary | ICD-10-CM

## 2018-08-28 DIAGNOSIS — E119 Type 2 diabetes mellitus without complications: Secondary | ICD-10-CM

## 2018-08-28 DIAGNOSIS — I10 Essential (primary) hypertension: Secondary | ICD-10-CM | POA: Diagnosis not present

## 2018-08-28 DIAGNOSIS — Z23 Encounter for immunization: Secondary | ICD-10-CM

## 2018-08-28 DIAGNOSIS — E1159 Type 2 diabetes mellitus with other circulatory complications: Secondary | ICD-10-CM

## 2018-08-28 DIAGNOSIS — I152 Hypertension secondary to endocrine disorders: Secondary | ICD-10-CM

## 2018-08-28 DIAGNOSIS — E11319 Type 2 diabetes mellitus with unspecified diabetic retinopathy without macular edema: Secondary | ICD-10-CM

## 2018-08-28 DIAGNOSIS — E1169 Type 2 diabetes mellitus with other specified complication: Secondary | ICD-10-CM | POA: Diagnosis not present

## 2018-08-28 DIAGNOSIS — E663 Overweight: Secondary | ICD-10-CM

## 2018-08-28 DIAGNOSIS — E782 Mixed hyperlipidemia: Secondary | ICD-10-CM | POA: Diagnosis not present

## 2018-08-28 LAB — BMP8+EGFR
BUN/Creatinine Ratio: 19 (ref 10–24)
BUN: 16 mg/dL (ref 8–27)
CALCIUM: 10.3 mg/dL — AB (ref 8.6–10.2)
CHLORIDE: 100 mmol/L (ref 96–106)
CO2: 25 mmol/L (ref 20–29)
Creatinine, Ser: 0.83 mg/dL (ref 0.76–1.27)
GFR calc non Af Amer: 87 mL/min/{1.73_m2} (ref >59)
GFR, EST AFRICAN AMERICAN: 101 mL/min/{1.73_m2} (ref >59)
Glucose: 81 mg/dL (ref 65–99)
POTASSIUM: 4.5 mmol/L (ref 3.5–5.2)
Sodium: 141 mmol/L (ref 134–144)

## 2018-08-28 LAB — BAYER DCA HB A1C WAIVED: HB A1C: 6.4 % (ref <7.0)

## 2018-08-28 MED ORDER — DULAGLUTIDE 0.75 MG/0.5ML ~~LOC~~ SOAJ: 0.7500 mg | SUBCUTANEOUS | 11 refills | Status: DC

## 2018-08-28 NOTE — Progress Notes (Signed)
BP 136/72   Pulse (!) 57   Temp (!) 96.5 F (35.8 C) (Oral)   Ht '5\' 7"'  (1.702 m)   Wt 173 lb 3.2 oz (78.6 kg)   BMI 27.13 kg/m    Subjective:    Patient ID: George [name], male    DOB: Sep 01, 1946, 72 y.o.   MRN: [id number]  HPI: George [name] is a 72 y.o. male presenting on 08/28/2018 for Diabetes (3 month follow up. Patient states his Ozempic is too high so he has not been taking it.) and Hypertension   HPI Type 2 diabetes mellitus Patient comes in today for recheck of his diabetes. Patient has been currently taking metformin and glipizide, patient has been off the Ozempic because it was going to cost him $600 during the last month so he has not been taking it.  He says is been 2 months since he has had it.. Patient is currently on an ACE inhibitor/ARB. Patient has not seen an ophthalmologist this year. Patient denies any issues with their feet.  Except thickening of the toenails which she is had for some time.  Patient has known diabetic retinopathy and will get an appointment to go back in and see the ophthalmologist  Hypertension Patient is currently on lisinopril, and their blood pressure today is 136/72. Patient denies any lightheadedness or dizziness. Patient denies headaches, blurred vision, chest pains, shortness of breath, or weakness. Denies any side effects from medication and is content with current medication.   Hyperlipidemia Patient is coming in for recheck of his hyperlipidemia. The patient is currently taking fish oil and pravastatin. They deny any issues with myalgias or history of liver damage from it. They deny any focal numbness or weakness or chest pain.   Relevant past medical, surgical, family and social history reviewed and updated as indicated. Interim medical history since our last visit reviewed. Allergies and medications reviewed and updated.  Review of Systems  Constitutional: Negative for chills and fever.  Eyes: Negative for visual disturbance.   Respiratory: Negative for shortness of breath and wheezing.   Cardiovascular: Negative for chest pain and leg swelling.  Musculoskeletal: Negative for back pain and gait problem.  Skin: Negative for rash.  Neurological: Negative for dizziness, weakness, light-headedness and headaches.  All other systems reviewed and are negative.   Per HPI unless specifically indicated above   Allergies as of 08/28/2018   No Known Allergies     Medication List        Accurate as of 08/28/18  8:51 AM. Always use your most recent med list.          aspirin 81 MG tablet Take 81 mg by mouth daily.   calcium carbonate 600 MG Tabs tablet Commonly known as:  OS-CAL Take 600 mg by mouth daily with breakfast.   CINNAMON PO Take 1,000 mg by mouth daily.   CLEAR EYES FOR DRY EYES 1-0.25 % Soln Generic drug:  Carboxymethylcellul-Glycerin Place 1-2 drops into both eyes daily as needed.   Dulaglutide 0.75 MG/0.5ML Sopn Inject 0.75 mg into the skin once a week.   fish oil-omega-3 fatty acids 1000 MG capsule Take 2 g by mouth daily.   glimepiride 4 MG tablet Commonly known as:  AMARYL TAKE 2 TABLETS (8 MG TOTAL) BY MOUTH DAILY BEFORE BREAKFAST   lisinopril 20 MG tablet Commonly known as:  PRINIVIL,ZESTRIL Take 1 tablet (20 mg total) by mouth daily.   metFORMIN 1000 MG tablet Commonly known as:  GLUCOPHAGE Take  1 tablet (1,000 mg total) by mouth 2 (two) times daily with a meal.   pravastatin 80 MG tablet Commonly known as:  PRAVACHOL Take 1 tablet (80 mg total) by mouth daily.          Objective:    BP 136/72   Pulse (!) 57   Temp (!) 96.5 F (35.8 C) (Oral)   Ht '5\' 7"'  (1.702 m)   Wt 173 lb 3.2 oz (78.6 kg)   BMI 27.13 kg/m   Wt Readings from Last 3 Encounters:  08/28/18 173 lb 3.2 oz (78.6 kg)  05/12/18 173 lb 3.2 oz (78.6 kg)  02/03/18 172 lb (78 kg)    Physical Exam  Constitutional: He is oriented to person, place, and time. He appears well-developed and  well-nourished. No distress.  Eyes: Conjunctivae are normal. No scleral icterus.  Neck: Neck supple. No thyromegaly present.  Cardiovascular: Normal rate, regular rhythm, normal heart sounds and intact distal pulses.  No murmur heard. Pulmonary/Chest: Effort normal and breath sounds normal. No respiratory distress. He has no wheezes.  Musculoskeletal: Normal range of motion. He exhibits no edema.  Lymphadenopathy:    He has no cervical adenopathy.  Neurological: He is alert and oriented to person, place, and time. Coordination normal.  Skin: Skin is warm and dry. No rash noted. He is not diaphoretic.  Psychiatric: He has a normal mood and affect. His behavior is normal.  Nursing note and vitals reviewed.   Diabetic Foot Exam - Simple   Simple Foot Form Diabetic Foot exam was performed with the following findings:  Yes 08/28/2018  8:52 AM  Visual Inspection No deformities, no ulcerations, no other skin breakdown bilaterally:  Yes Sensation Testing Intact to touch and monofilament testing bilaterally:  Yes Pulse Check Posterior Tibialis and Dorsalis pulse intact bilaterally:  Yes Comments Patient does have onychomycosis in all of his toenails and thickening and yellowing, he is able to still cut on his own so he does not want to go see a podiatrist at this point but will consider in the future.        Assessment & Plan:   Problem List Items Addressed This Visit      Cardiovascular and Mediastinum   Hypertension associated with diabetes ([place])   Relevant Medications   Dulaglutide (TRULICITY) 4.25 ZD/6.3OV SOPN     Endocrine   Type 2 diabetes mellitus not at goal [facility])   Relevant Medications   Dulaglutide (TRULICITY) 5.64 PP/2.9JJ SOPN   Other Relevant Orders   Bayer DCA Hb A1c Waived   BMP8+EGFR   Mixed hyperlipidemia due to type 2 diabetes mellitus (HCC)   Relevant Medications   Dulaglutide (TRULICITY) 8.84 ZY/6.0YT SOPN   Diabetic retinopathy (HCC)   Relevant  Medications   Dulaglutide (TRULICITY) 0.16 WF/0.9NA SOPN   Type 2 diabetes mellitus with hyperlipidemia (HCC) - Primary   Relevant Medications   Dulaglutide (TRULICITY) 3.55 DD/2.2GU SOPN   Other Relevant Orders   Bayer DCA Hb A1c Waived   BMP8+EGFR     Other   Overweight (BMI 25.0-29.9)       Follow up plan: Return in about 3 months (around 11/28/2018), or if symptoms worsen or fail to improve, for Diabetes recheck.  Counseling provided for all of the vaccine components Orders Placed This Encounter  Procedures  . Bayer DCA Hb A1c Waived  . BMP8+EGFR    Caryl Pina, MD [facility] 08/28/2018, 8:51 AM

## 2018-09-02 ENCOUNTER — Other Ambulatory Visit: Payer: Self-pay | Admitting: Family Medicine

## 2018-09-08 ENCOUNTER — Other Ambulatory Visit: Payer: Self-pay | Admitting: Family Medicine

## 2018-10-21 ENCOUNTER — Other Ambulatory Visit: Payer: Self-pay | Admitting: *Deleted

## 2018-10-21 DIAGNOSIS — E1169 Type 2 diabetes mellitus with other specified complication: Secondary | ICD-10-CM

## 2018-10-21 DIAGNOSIS — E119 Type 2 diabetes mellitus without complications: Secondary | ICD-10-CM

## 2018-10-21 DIAGNOSIS — E785 Hyperlipidemia, unspecified: Principal | ICD-10-CM

## 2018-10-21 MED ORDER — DULAGLUTIDE 0.75 MG/0.5ML ~~LOC~~ SOAJ
0.7500 mg | SUBCUTANEOUS | 0 refills | Status: DC
Start: 2018-10-21 — End: 2019-03-23

## 2018-10-21 NOTE — Telephone Encounter (Signed)
Samples given.  

## 2018-11-24 ENCOUNTER — Telehealth: Payer: Self-pay

## 2018-11-24 ENCOUNTER — Ambulatory Visit (INDEPENDENT_AMBULATORY_CARE_PROVIDER_SITE_OTHER): Payer: Medicare HMO

## 2018-11-24 VITALS — BP 138/65 | HR 56 | Temp 96.9°F | Ht 67.0 in | Wt 180.0 lb

## 2018-11-24 DIAGNOSIS — R195 Other fecal abnormalities: Secondary | ICD-10-CM

## 2018-11-24 DIAGNOSIS — Z0000 Encounter for general adult medical examination without abnormal findings: Secondary | ICD-10-CM | POA: Diagnosis not present

## 2018-11-24 NOTE — Telephone Encounter (Signed)
FYI, George [name] was seen today for his Medicare annual wellness exam.  While planning for his visit, I found that he had a positive Cologuard screening that he never followed up on.  We referred him to his gastroenterologist but when they called him to set up an appointment he declined.  He has agreed today to go see them for this positive screening.
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
## 2018-11-24 NOTE — Patient Instructions (Signed)
  George [name] , Thank you for taking time to come for your Medicare Wellness Visit. I appreciate your ongoing commitment to your health goals. Please review the following plan we discussed and let me know if I can assist you in the future.   These are the goals we discussed: Goals    . Exercise 150 min/wk Moderate Activity    . Have 3 meals a day       This is a list of the screening recommended for you and due dates:  Health Maintenance  Topic Date Due  . Eye exam for diabetics  03/11/2018  . Hemoglobin A1C  02/26/2019  . Complete foot exam   08/29/2019  . Colon Cancer Screening  08/21/2023  . Tetanus Vaccine  05/31/2025  . Flu Shot  Completed  .  Hepatitis C: One time screening is recommended by Center for Disease Control  (CDC) for  adults born from [birth year] through 1966.   Completed  . Pneumonia vaccines  Completed

## 2018-11-30 ENCOUNTER — Other Ambulatory Visit: Payer: Self-pay | Admitting: Family Medicine

## 2018-11-30 DIAGNOSIS — E785 Hyperlipidemia, unspecified: Principal | ICD-10-CM

## 2018-11-30 DIAGNOSIS — E1169 Type 2 diabetes mellitus with other specified complication: Secondary | ICD-10-CM

## 2018-12-11 ENCOUNTER — Other Ambulatory Visit: Payer: Self-pay | Admitting: Family Medicine

## 2018-12-15 ENCOUNTER — Ambulatory Visit (INDEPENDENT_AMBULATORY_CARE_PROVIDER_SITE_OTHER): Payer: Medicare HMO | Admitting: Family Medicine

## 2018-12-15 ENCOUNTER — Encounter: Payer: Self-pay | Admitting: Family Medicine

## 2018-12-15 ENCOUNTER — Other Ambulatory Visit: Payer: Self-pay

## 2018-12-15 VITALS — BP 145/78 | HR 69 | Temp 97.5°F | Ht 67.0 in | Wt 177.6 lb

## 2018-12-15 DIAGNOSIS — E1159 Type 2 diabetes mellitus with other circulatory complications: Secondary | ICD-10-CM | POA: Diagnosis not present

## 2018-12-15 DIAGNOSIS — E119 Type 2 diabetes mellitus without complications: Secondary | ICD-10-CM

## 2018-12-15 DIAGNOSIS — I152 Hypertension secondary to endocrine disorders: Secondary | ICD-10-CM

## 2018-12-15 DIAGNOSIS — E785 Hyperlipidemia, unspecified: Secondary | ICD-10-CM | POA: Diagnosis not present

## 2018-12-15 DIAGNOSIS — E782 Mixed hyperlipidemia: Secondary | ICD-10-CM

## 2018-12-15 DIAGNOSIS — E11319 Type 2 diabetes mellitus with unspecified diabetic retinopathy without macular edema: Secondary | ICD-10-CM

## 2018-12-15 DIAGNOSIS — E663 Overweight: Secondary | ICD-10-CM

## 2018-12-15 DIAGNOSIS — I10 Essential (primary) hypertension: Secondary | ICD-10-CM | POA: Diagnosis not present

## 2018-12-15 DIAGNOSIS — E1169 Type 2 diabetes mellitus with other specified complication: Secondary | ICD-10-CM

## 2018-12-15 LAB — BAYER DCA HB A1C WAIVED: HB A1C (BAYER DCA - WAIVED): 7.4 % — ABNORMAL HIGH (ref <7.0)

## 2018-12-15 MED ORDER — PRAVASTATIN SODIUM 80 MG PO TABS: 80.0000 mg | ORAL_TABLET | Freq: Every day | ORAL | 3 refills | Status: DC

## 2018-12-15 MED ORDER — METFORMIN HCL 1000 MG PO TABS: 1000.0000 mg | ORAL_TABLET | Freq: Two times a day (BID) | ORAL | 3 refills | Status: DC

## 2018-12-15 MED ORDER — GLIMEPIRIDE 4 MG PO TABS: ORAL_TABLET | ORAL | 3 refills | Status: DC

## 2018-12-15 MED ORDER — LISINOPRIL 20 MG PO TABS: 20.0000 mg | ORAL_TABLET | Freq: Every day | ORAL | 3 refills | Status: DC

## 2018-12-15 NOTE — Progress Notes (Signed)
BP (!) 145/78   Pulse 69   Temp (!) 97.5 F (36.4 C) (Oral)   Ht '5\' 7"'  (1.702 m)   Wt 177 lb 9.6 oz (80.6 kg)   BMI 27.82 kg/m    Subjective:    Patient ID: George [name], male    DOB: 1945-12-13, 73 y.o.   MRN: [id number]  HPI: George [name] is a 73 y.o. male presenting on 12/15/2018 for Diabetes (3 month follow up)   HPI Type 2 diabetes mellitus Patient comes in today for recheck of his diabetes. Patient has been currently taking Trulicity and glimepiride and metformin. Patient is currently on an ACE inhibitor/ARB. Patient has not seen an ophthalmologist this year. Patient denies any issues with their feet.   Hyperlipidemia Patient is coming in for recheck of his hyperlipidemia. The patient is currently taking pravastatin and fish oils. They deny any issues with myalgias or history of liver damage from it. They deny any focal numbness or weakness or chest pain.   Hypertension Patient is currently on lisinopril, and their blood pressure today is 145/78. Patient denies any lightheadedness or dizziness. Patient denies headaches, blurred vision, chest pains, shortness of breath, or weakness. Denies any side effects from medication and is content with current medication.   Relevant past medical, surgical, family and social history reviewed and updated as indicated. Interim medical history since our last visit reviewed. Allergies and medications reviewed and updated.  Review of Systems  Constitutional: Negative for chills and fever.  Eyes: Negative for visual disturbance.  Respiratory: Negative for shortness of breath and wheezing.   Cardiovascular: Negative for chest pain and leg swelling.  Musculoskeletal: Negative for back pain and gait problem.  Skin: Negative for rash.  Neurological: Negative for dizziness, weakness, light-headedness and numbness.  All other systems reviewed and are negative.   Per HPI unless specifically indicated above   Allergies as of 12/15/2018    No Known Allergies     Medication List       Accurate as of December 15, 2018  8:34 AM. Always use your most recent med list.        aspirin 81 MG tablet Take 81 mg by mouth daily.   calcium carbonate 600 MG Tabs tablet Commonly known as:  OS-CAL Take 600 mg by mouth daily with breakfast.   CINNAMON PO Take 1,000 mg by mouth daily.   Clear Eyes for Dry Eyes 1-0.25 % Soln Generic drug:  Carboxymethylcellul-Glycerin Place 1-2 drops into both eyes daily as needed.   Dulaglutide 0.75 MG/0.5ML Sopn Commonly known as:  Trulicity Inject 5.05 mg into the skin once a week.   fish oil-omega-3 fatty acids 1000 MG capsule Take 2 g by mouth daily.   glimepiride 4 MG tablet Commonly known as:  AMARYL 2 tablets daily before breakfast   lisinopril 20 MG tablet Commonly known as:  PRINIVIL,ZESTRIL Take 1 tablet (20 mg total) by mouth daily.   metFORMIN 1000 MG tablet Commonly known as:  GLUCOPHAGE Take 1 tablet (1,000 mg total) by mouth 2 (two) times daily with a meal.   pravastatin 80 MG tablet Commonly known as:  PRAVACHOL Take 1 tablet (80 mg total) by mouth daily.          Objective:    BP (!) 145/78   Pulse 69   Temp (!) 97.5 F (36.4 C) (Oral)   Ht '5\' 7"'  (1.702 m)   Wt 177 lb 9.6 oz (80.6 kg)   BMI 27.82 kg/m  Wt Readings from Last 3 Encounters:  12/15/18 177 lb 9.6 oz (80.6 kg)  11/24/18 180 lb (81.6 kg)  08/28/18 173 lb 3.2 oz (78.6 kg)    Physical Exam Vitals signs and nursing note reviewed.  Constitutional:      General: He is not in acute distress.    Appearance: He is well-developed. He is not diaphoretic.  Eyes:     General: No scleral icterus.       Right eye: No discharge.     Conjunctiva/sclera: Conjunctivae normal.     Pupils: Pupils are equal, round, and reactive to light.  Neck:     Musculoskeletal: Neck supple.     Thyroid: No thyromegaly.  Cardiovascular:     Rate and Rhythm: Normal rate and regular rhythm.     Heart sounds: Normal  heart sounds. No murmur.  Pulmonary:     Effort: Pulmonary effort is normal. No respiratory distress.     Breath sounds: Normal breath sounds. No wheezing.  Musculoskeletal: Normal range of motion.  Lymphadenopathy:     Cervical: No cervical adenopathy.  Skin:    General: Skin is warm and dry.     Findings: No rash.  Neurological:     Mental Status: He is alert and oriented to person, place, and time.     Coordination: Coordination normal.  Psychiatric:        Behavior: Behavior normal.         Assessment & Plan:   Problem List Items Addressed This Visit      Cardiovascular and Mediastinum   Hypertension associated with diabetes ([place])   Relevant Medications   glimepiride (AMARYL) 4 MG tablet   pravastatin (PRAVACHOL) 80 MG tablet   metFORMIN (GLUCOPHAGE) 1000 MG tablet   lisinopril (PRINIVIL,ZESTRIL) 20 MG tablet   Other Relevant Orders   CMP14+EGFR     Endocrine   Type 2 diabetes mellitus not at goal [facility])   Relevant Medications   glimepiride (AMARYL) 4 MG tablet   pravastatin (PRAVACHOL) 80 MG tablet   metFORMIN (GLUCOPHAGE) 1000 MG tablet   lisinopril (PRINIVIL,ZESTRIL) 20 MG tablet   Other Relevant Orders   Bayer DCA Hb A1c Waived   CMP14+EGFR   Mixed hyperlipidemia due to type 2 diabetes mellitus (HCC) - Primary   Relevant Medications   glimepiride (AMARYL) 4 MG tablet   pravastatin (PRAVACHOL) 80 MG tablet   metFORMIN (GLUCOPHAGE) 1000 MG tablet   lisinopril (PRINIVIL,ZESTRIL) 20 MG tablet   Other Relevant Orders   Lipid panel   Diabetic retinopathy (HCC)   Relevant Medications   glimepiride (AMARYL) 4 MG tablet   pravastatin (PRAVACHOL) 80 MG tablet   metFORMIN (GLUCOPHAGE) 1000 MG tablet   lisinopril (PRINIVIL,ZESTRIL) 20 MG tablet   Type 2 diabetes mellitus with hyperlipidemia (HCC)   Relevant Medications   glimepiride (AMARYL) 4 MG tablet   pravastatin (PRAVACHOL) 80 MG tablet   metFORMIN (GLUCOPHAGE) 1000 MG tablet   lisinopril  (PRINIVIL,ZESTRIL) 20 MG tablet   Other Relevant Orders   Bayer DCA Hb A1c Waived   Lipid panel     Other   Overweight (BMI 25.0-29.9)       Follow up plan: Return in about 3 months (around 03/17/2019), or if symptoms worsen or fail to improve, for Diabetes and hypertension recheck.  Counseling provided for all of the vaccine components Orders Placed This Encounter  Procedures  . Bayer DCA Hb A1c Waived  . Lipid panel  . ONG29+BMWU    Caryl Pina,  MD [place] 12/15/2018, 8:34 AM

## 2018-12-16 LAB — LIPID PANEL
Chol/HDL Ratio: 3.9 ratio (ref 0.0–5.0)
Cholesterol, Total: 182 mg/dL (ref 100–199)
HDL: 47 mg/dL (ref >39)
LDL CALC: 120 mg/dL — AB (ref 0–99)
Triglycerides: 75 mg/dL (ref 0–149)
VLDL Cholesterol Cal: 15 mg/dL (ref 5–40)

## 2018-12-16 LAB — CMP14+EGFR
ALT: 21 IU/L (ref 0–44)
AST: 23 IU/L (ref 0–40)
Albumin/Globulin Ratio: 1.8 (ref 1.2–2.2)
Albumin: 4.4 g/dL (ref 3.7–4.7)
Alkaline Phosphatase: 55 IU/L (ref 39–117)
BUN/Creatinine Ratio: 21 (ref 10–24)
BUN: 21 mg/dL (ref 8–27)
Bilirubin Total: 0.3 mg/dL (ref 0.0–1.2)
CALCIUM: 10.2 mg/dL (ref 8.6–10.2)
CO2: 24 mmol/L (ref 20–29)
CREATININE: 0.99 mg/dL (ref 0.76–1.27)
Chloride: 101 mmol/L (ref 96–106)
GFR calc Af Amer: 87 mL/min/{1.73_m2} (ref >59)
GFR calc non Af Amer: 75 mL/min/{1.73_m2} (ref >59)
Globulin, Total: 2.4 g/dL (ref 1.5–4.5)
Glucose: 89 mg/dL (ref 65–99)
Potassium: 5.3 mmol/L — ABNORMAL HIGH (ref 3.5–5.2)
Sodium: 140 mmol/L (ref 134–144)
Total Protein: 6.8 g/dL (ref 6.0–8.5)

## 2019-03-22 ENCOUNTER — Other Ambulatory Visit: Payer: Self-pay

## 2019-03-23 ENCOUNTER — Ambulatory Visit (INDEPENDENT_AMBULATORY_CARE_PROVIDER_SITE_OTHER): Payer: Medicare HMO | Admitting: Family Medicine

## 2019-03-23 ENCOUNTER — Other Ambulatory Visit: Payer: Self-pay

## 2019-03-23 ENCOUNTER — Encounter: Payer: Self-pay | Admitting: Family Medicine

## 2019-03-23 VITALS — BP 134/62 | HR 63 | Temp 98.3°F | Ht 67.0 in | Wt 180.4 lb

## 2019-03-23 DIAGNOSIS — E782 Mixed hyperlipidemia: Secondary | ICD-10-CM | POA: Diagnosis not present

## 2019-03-23 DIAGNOSIS — I10 Essential (primary) hypertension: Secondary | ICD-10-CM | POA: Diagnosis not present

## 2019-03-23 DIAGNOSIS — E1169 Type 2 diabetes mellitus with other specified complication: Secondary | ICD-10-CM | POA: Diagnosis not present

## 2019-03-23 DIAGNOSIS — E11319 Type 2 diabetes mellitus with unspecified diabetic retinopathy without macular edema: Secondary | ICD-10-CM

## 2019-03-23 DIAGNOSIS — E119 Type 2 diabetes mellitus without complications: Secondary | ICD-10-CM

## 2019-03-23 DIAGNOSIS — E785 Hyperlipidemia, unspecified: Secondary | ICD-10-CM | POA: Diagnosis not present

## 2019-03-23 DIAGNOSIS — E1159 Type 2 diabetes mellitus with other circulatory complications: Secondary | ICD-10-CM

## 2019-03-23 DIAGNOSIS — I152 Hypertension secondary to endocrine disorders: Secondary | ICD-10-CM

## 2019-03-23 LAB — BAYER DCA HB A1C WAIVED: HB A1C (BAYER DCA - WAIVED): 8 % — ABNORMAL HIGH (ref <7.0)

## 2019-03-23 MED ORDER — TRULICITY 0.75 MG/0.5ML ~~LOC~~ SOAJ: 0.7500 mg | SUBCUTANEOUS | 3 refills | Status: DC

## 2019-03-23 NOTE — Progress Notes (Signed)
 BP 134/62   Pulse 63   Temp 98.3 F (36.8 C) (Oral)   Ht 5' 7" (1.702 m)   Wt 180 lb 6.4 oz (81.8 kg)   BMI 28.25 kg/m    Subjective:   Patient ID: George [name], male    DOB: 10/12/1945, 73 y.o.   MRN: [id number]  HPI: George [name] is a 73 y.o. male presenting on 03/23/2019 for Diabetes (3 month follow up)   HPI Type 2 diabetes mellitus Patient comes in today for recheck of his diabetes. Patient has been currently taking metformin and glimepiride, had Trulicity for couple weeks and samples but does not have currently. Patient is currently on an ACE inhibitor/ARB. Patient has not seen an ophthalmologist this year and we discussed the need for this with the patient. Patient denies any issues with their feet.  Patient has had diagnoses of retinopathy and needs to follow-up with ophthalmology  Hyperlipidemia Patient is coming in for recheck of his hyperlipidemia. The patient is currently taking fish oil and pravastatin. They deny any issues with myalgias or history of liver damage from it. They deny any focal numbness or weakness or chest pain.   Hypertension Patient is currently on lisinopril, and their blood pressure today is 134/62. Patient denies any lightheadedness or dizziness. Patient denies headaches, blurred vision, chest pains, shortness of breath, or weakness. Denies any side effects from medication and is content with current medication.   Relevant past medical, surgical, family and social history reviewed and updated as indicated. Interim medical history since our last visit reviewed. Allergies and medications reviewed and updated.  Review of Systems  Constitutional: Negative for chills and fever.  Respiratory: Negative for shortness of breath and wheezing.   Cardiovascular: Negative for chest pain and leg swelling.  Musculoskeletal: Negative for back pain and gait problem.  Skin: Negative for rash.  Neurological: Negative for dizziness, weakness and  light-headedness.  All other systems reviewed and are negative.   Per HPI unless specifically indicated above   Allergies as of 03/23/2019   No Known Allergies     Medication List       Accurate as of March 23, 2019 12:48 PM. If you have any questions, ask your nurse or doctor.        aspirin 81 MG tablet Take 81 mg by mouth daily.   calcium carbonate 600 MG Tabs tablet Commonly known as: OS-CAL Take 600 mg by mouth daily with breakfast.   CINNAMON PO Take 1,000 mg by mouth daily.   Clear Eyes for Dry Eyes 1-0.25 % Soln Generic drug: Carboxymethylcellul-Glycerin Place 1-2 drops into both eyes daily as needed.   fish oil-omega-3 fatty acids 1000 MG capsule Take 2 g by mouth daily.   glimepiride 4 MG tablet Commonly known as: AMARYL 2 tablets daily before breakfast   lisinopril 20 MG tablet Commonly known as: ZESTRIL Take 1 tablet (20 mg total) by mouth daily.   metFORMIN 1000 MG tablet Commonly known as: GLUCOPHAGE Take 1 tablet (1,000 mg total) by mouth 2 (two) times daily with a meal.   pravastatin 80 MG tablet Commonly known as: PRAVACHOL Take 1 tablet (80 mg total) by mouth daily.   Trulicity 0.75 MG/0.5ML Sopn Generic drug: Dulaglutide Inject 0.75 mg into the skin once a week.        Objective:   BP 134/62   Pulse 63   Temp 98.3 F (36.8 C) (Oral)   Ht 5' 7" (1.702 m)   Wt   180 lb 6.4 oz (81.8 kg)   BMI 28.25 kg/m   Wt Readings from Last 3 Encounters:  03/23/19 180 lb 6.4 oz (81.8 kg)  12/15/18 177 lb 9.6 oz (80.6 kg)  11/24/18 180 lb (81.6 kg)    Physical Exam Vitals signs and nursing note reviewed.  Constitutional:      General: He is not in acute distress.    Appearance: He is well-developed. He is not diaphoretic.  Eyes:     General: No scleral icterus.    Conjunctiva/sclera: Conjunctivae normal.  Neck:     Musculoskeletal: Neck supple.     Thyroid: No thyromegaly.  Cardiovascular:     Rate and Rhythm: Normal rate and regular  rhythm.     Heart sounds: Normal heart sounds. No murmur.  Pulmonary:     Effort: Pulmonary effort is normal. No respiratory distress.     Breath sounds: Normal breath sounds. No wheezing.  Musculoskeletal: Normal range of motion.        General: No swelling.  Lymphadenopathy:     Cervical: No cervical adenopathy.  Skin:    General: Skin is warm and dry.     Findings: No rash.  Neurological:     Mental Status: He is alert and oriented to person, place, and time.     Coordination: Coordination normal.  Psychiatric:        Behavior: Behavior normal.       Assessment & Plan:   Problem List Items Addressed This Visit      Cardiovascular and Mediastinum   Hypertension associated with diabetes (HCC)   Relevant Medications   Dulaglutide (TRULICITY) 0.75 MG/0.5ML SOPN   Other Relevant Orders   CMP14+EGFR     Endocrine   Type 2 diabetes mellitus not at goal (HCC)   Relevant Medications   Dulaglutide (TRULICITY) 0.75 MG/0.5ML SOPN   Other Relevant Orders   Bayer DCA Hb A1c Waived   CMP14+EGFR   Mixed hyperlipidemia due to type 2 diabetes mellitus (HCC)   Relevant Medications   Dulaglutide (TRULICITY) 0.75 MG/0.5ML SOPN   Other Relevant Orders   Lipid panel   Type 2 diabetes mellitus with hyperlipidemia (HCC) - Primary   Relevant Medications   Dulaglutide (TRULICITY) 0.75 MG/0.5ML SOPN   Other Relevant Orders   Bayer DCA Hb A1c Waived   CMP14+EGFR     Other   Diabetic retinopathy (HCC)   Relevant Medications   Dulaglutide (TRULICITY) 0.75 MG/0.5ML SOPN   Other Relevant Orders   Bayer DCA Hb A1c Waived   CMP14+EGFR      Patient had not been taking Trulicity and we will send him a prescription for it, he tried it for 2 weeks and did not have any issues with it and will try it more long-term. Follow up plan: Return in about 3 months (around 06/23/2019), or if symptoms worsen or fail to improve, for Diabetes and hypertension recheck.  Counseling provided for all of  the vaccine components Orders Placed This Encounter  Procedures  . Bayer DCA Hb A1c Waived  . CMP14+EGFR  . Lipid panel     , MD [facility] 03/23/2019, 12:48 PM     

## 2019-03-24 LAB — CMP14+EGFR
ALT: 28 IU/L (ref 0–44)
AST: 22 IU/L (ref 0–40)
Albumin/Globulin Ratio: 1.6 (ref 1.2–2.2)
Albumin: 3.9 g/dL (ref 3.7–4.7)
Alkaline Phosphatase: 56 IU/L (ref 39–117)
BUN/Creatinine Ratio: 18 (ref 10–24)
BUN: 17 mg/dL (ref 8–27)
Bilirubin Total: 0.3 mg/dL (ref 0.0–1.2)
CO2: 24 mmol/L (ref 20–29)
Calcium: 9.3 mg/dL (ref 8.6–10.2)
Chloride: 99 mmol/L (ref 96–106)
Creatinine, Ser: 0.96 mg/dL (ref 0.76–1.27)
GFR calc Af Amer: 90 mL/min/{1.73_m2} (ref >59)
GFR calc non Af Amer: 78 mL/min/{1.73_m2} (ref >59)
Globulin, Total: 2.5 g/dL (ref 1.5–4.5)
Glucose: 188 mg/dL — ABNORMAL HIGH (ref 65–99)
Potassium: 4.9 mmol/L (ref 3.5–5.2)
Sodium: 139 mmol/L (ref 134–144)
Total Protein: 6.4 g/dL (ref 6.0–8.5)

## 2019-03-24 LAB — LIPID PANEL
Chol/HDL Ratio: 4.8 ratio (ref 0.0–5.0)
Cholesterol, Total: 210 mg/dL — ABNORMAL HIGH (ref 100–199)
HDL: 44 mg/dL (ref >39)
LDL Calculated: 137 mg/dL — ABNORMAL HIGH (ref 0–99)
Triglycerides: 143 mg/dL (ref 0–149)
VLDL Cholesterol Cal: 29 mg/dL (ref 5–40)

## 2019-04-19 ENCOUNTER — Telehealth: Payer: Self-pay | Admitting: Family Medicine

## 2019-04-19 NOTE — Chronic Care Management (AMB) (Signed)
°  Chronic Care Management   Outreach Note  04/19/2019 Name: LARENZO CAPLES MRN: [id number] DOB: [birth year]  Referred by: Dettinger, Fransisca Kaufmann, MD Reason for referral : Chronic Care Management (Initial CCM outreach was unsuccessful.)   An unsuccessful telephone outreach was attempted today. The patient was referred to the case management team by for assistance with chronic care management and care coordination.   Follow Up Plan: The care management team will reach out to the patient again over the next 7 days.   [place]  [email or web address]   ??[phone]

## 2019-05-02 NOTE — Chronic Care Management (AMB) (Signed)
Chronic Care Management  ° °Note ° °05/02/2019 °Name: George [name] MRN: [id number] DOB: 10/16/1945 ° °George [name] is [name] year old male who is [name] of George [name], George A, MD. I reached out to George [name] by phone today in response to [name].   ° °George [name] was given information about Chronic Care Management services today including:  °1. CCM service includes personalized support from designated clinical staff supervised by his physician, including individualized plan of care and coordination with other care providers °2. 24/7 contact phone numbers for assistance for urgent and routine care needs. °3. Service will only be billed when office clinical staff spend 20 minutes or more in [name] to coordinate care. °4. Only one practitioner may furnish and bill the service in [name]. °5. The patient may stop CCM services at any time (effective at the end of the month) by phone call to the office staff. °6. The patient will be responsible for cost sharing (co-pay) of up to 20% of the service fee (after annual deductible is met). ° °[name] agreed to services and verbal consent obtained.  ° °Follow up plan: °Telephone appointment with CCM team member scheduled for: 05/08/2019 ° °Bernice Cicero °Care Guide • [facility] °[facility]  °[email or web address]   ??[phone]   ° ° ° °

## 2019-05-08 ENCOUNTER — Ambulatory Visit: Payer: Medicare HMO | Admitting: *Deleted

## 2019-05-08 DIAGNOSIS — E1159 Type 2 diabetes mellitus with other circulatory complications: Secondary | ICD-10-CM

## 2019-05-08 DIAGNOSIS — E119 Type 2 diabetes mellitus without complications: Secondary | ICD-10-CM

## 2019-05-08 DIAGNOSIS — I152 Hypertension secondary to endocrine disorders: Secondary | ICD-10-CM

## 2019-05-08 NOTE — Patient Instructions (Signed)
Visit Information  Goals Addressed    . Work with RNCM to develop plan for managing chronic medical conditions with primary focus on blood sugar control for now.       Current Barriers:  . Chronic Disease Management support and education needs related to diabetes, hypertension, and hyperlipidemia  Nurse Case Manager Clinical Goal(s):  Marland Kitchen Over the next 30 days, patient will meet with RN Care Manager to address goals for management of diabetes and other chronic medical conditions.   Interventions:  . Spoke with granddaughter, Janett Billow, by telephone.  Marland Kitchen RNCM will prepare some educational materials regarding diabetes management . RNCM will mail card with CCM service information   Patient Self Care Activities:  . Performs ADL's independently  Initial goal documentation        Print copy of patient instructions provided.   To be mailed to patient's home.  Follow up Plan Telephone appointment scheduled with Nurse Care Manager for 05/30/19 at 1:00   Mr. Merrick was given information about Chronic Care Management services today including:  1. CCM service includes personalized support from designated clinical staff supervised by his physician, including individualized plan of care and coordination with other care providers 2. 24/7 contact phone numbers for assistance for urgent and routine care needs. 3. Service will only be billed when office clinical staff spend 20 minutes or more in a month to coordinate care. 4. Only one practitioner may furnish and bill the service in a calendar month. 5. The patient may stop CCM services at any time (effective at the end of the month) by phone call to the office staff. 6. The patient will be responsible for cost sharing (co-pay) of up to 20% of the service fee (after annual deductible is met).  Patient agreed to services and verbal consent given during initial outreach on 04/19/2019.   Chong Sicilian BSN, RN-BC Embedded Chronic Care Manager [facility] / [place] Management Direct Dial: [phone]

## 2019-05-08 NOTE — Chronic Care Management (AMB) (Signed)
  Chronic Care Management   Initial Visit Note  05/08/2019 Name: George [name] MRN: [id number] DOB: 1946-06-18  Referred by: Dettinger, Fransisca Kaufmann, MD Reason for referral : Chronic Care Management   George [name] is a 73 y.o. year old male who is a primary care patient of Dettinger, Fransisca Kaufmann, MD. The CCM team was consulted for assistance with chronic disease management and care coordination needs.   Review of patient status, including review of consultants reports, relevant laboratory and other test results, and collaboration with appropriate care team members and the patient's provider was performed as part of comprehensive patient evaluation and provision of chronic care management services.    Subjective I reached out to George [name] by telephone today. He was outside and unavailable to talk and George [name] was not at home. She asked their granddaughter, George [name], to speak with me. Per George [name], George [name] has a difficult time hearing over the telephone and it will be best to talk with George [name] in the future.  Objective Lab Results  Component Value Date   HGBA1C 8.0 (H) 03/23/2019   HGBA1C 7.4 (H) 12/15/2018   HGBA1C 6.4 08/28/2018   Lab Results  Component Value Date   MICROALBUR neg 11/13/2014   LDLCALC 137 (H) 03/23/2019   CREATININE 0.96 03/23/2019   Assessment: Continual increase in A1C over the past 10 months. Dr Dettinger noted that he would like for him to work on this.   Goals Addressed    . Patient/family would like to work with [facility] to manage chronic medical conditions with primary focus on blood sugar control for now.       Current Barriers:  . Chronic Disease Management support and education needs related to diabetes, hypertension, and hyperlipidemia  Nurse Case Manager Clinical Goal(s):  Marland Kitchen Over the next 30 days, patient will meet with RN Care Manager to address goals for management of diabetes and other chronic medical conditions.   Interventions:  . Spoke with  granddaughter, George [name], by telephone.  Marland Kitchen RNCM will prepare some educational materials regarding diabetes management . RNCM will mail card with CCM service information   Patient Self Care Activities:  . Performs ADL's independently  Initial goal documentation         Follow Up Plan:  Initial Visit scheduled with RNCM for 05/30/19 at 1:00 RNCM will mail information on CCM services and the consent form as well as information on diabetes management   Chong Sicilian BSN, RN-BC [place] / [place]: [phone]

## 2019-05-30 ENCOUNTER — Telehealth: Payer: Medicare HMO

## 2019-05-30 ENCOUNTER — Ambulatory Visit: Payer: Medicare HMO | Admitting: *Deleted

## 2019-05-30 DIAGNOSIS — E1159 Type 2 diabetes mellitus with other circulatory complications: Secondary | ICD-10-CM

## 2019-05-30 DIAGNOSIS — E119 Type 2 diabetes mellitus without complications: Secondary | ICD-10-CM

## 2019-05-30 DIAGNOSIS — I152 Hypertension secondary to endocrine disorders: Secondary | ICD-10-CM

## 2019-05-30 NOTE — Chronic Care Management (AMB) (Signed)
  Chronic Care Management   Outreach Note  05/30/2019 Name: George [name] MRN: [id number] DOB: April 23, 1946  Referred by: Dettinger, Fransisca Kaufmann, MD Reason for referral : Chronic Care Management (RNCM Initial Visit)   A second unsuccessful telephone outreach was attempted today. The patient was referred to the case management team for assistance with chronic care management and care coordination.   Follow Up Plan: The care management team will reach out to the patient again over the next 30 days.     Chong Sicilian BSN, RN-BC Embedded Chronic Care Manager [facility] / [place] Management Direct Dial: [phone]

## 2019-10-10 DIAGNOSIS — H2513 Age-related nuclear cataract, bilateral: Secondary | ICD-10-CM | POA: Diagnosis not present

## 2019-10-10 DIAGNOSIS — E119 Type 2 diabetes mellitus without complications: Secondary | ICD-10-CM | POA: Diagnosis not present

## 2019-10-10 LAB — HM DIABETES EYE EXAM

## 2019-10-23 DIAGNOSIS — H35373 Puckering of macula, bilateral: Secondary | ICD-10-CM | POA: Diagnosis not present

## 2019-10-23 DIAGNOSIS — H3552 Pigmentary retinal dystrophy: Secondary | ICD-10-CM | POA: Diagnosis not present

## 2019-10-23 DIAGNOSIS — H35033 Hypertensive retinopathy, bilateral: Secondary | ICD-10-CM | POA: Diagnosis not present

## 2019-10-23 DIAGNOSIS — H2513 Age-related nuclear cataract, bilateral: Secondary | ICD-10-CM | POA: Diagnosis not present

## 2019-10-23 DIAGNOSIS — H353132 Nonexudative age-related macular degeneration, bilateral, intermediate dry stage: Secondary | ICD-10-CM | POA: Diagnosis not present

## 2019-10-23 DIAGNOSIS — H348122 Central retinal vein occlusion, left eye, stable: Secondary | ICD-10-CM | POA: Diagnosis not present

## 2019-10-23 DIAGNOSIS — E103492 Type 1 diabetes mellitus with severe nonproliferative diabetic retinopathy without macular edema, left eye: Secondary | ICD-10-CM | POA: Diagnosis not present

## 2019-10-23 DIAGNOSIS — E103291 Type 1 diabetes mellitus with mild nonproliferative diabetic retinopathy without macular edema, right eye: Secondary | ICD-10-CM | POA: Diagnosis not present

## 2019-11-15 ENCOUNTER — Ambulatory Visit: Payer: Medicare HMO | Admitting: *Deleted

## 2019-11-15 NOTE — Chronic Care Management (AMB) (Signed)
  Chronic Care Management   Outreach Note  11/15/2019 Name: George [name] MRN: [id number] DOB: 1946/07/02  Referred by: Dettinger, Elige Radon, MD Reason for referral : Chronic Care Management (RN outreach)   A 3rd unsuccessful telephone outreach was attempted today. The patient was referred to the case management team for assistance with care management and care coordination.   Follow Up Plan: Patient has previously been provided with CCM contact information. CCM Team will remain available it patient and/or PCP request services.   Demetrios Loll, BSN, RN-BC Embedded Chronic Care Manager [facility] / [facility] Care Management Direct Dial: [phone]

## 2019-11-16 DIAGNOSIS — E103291 Type 1 diabetes mellitus with mild nonproliferative diabetic retinopathy without macular edema, right eye: Secondary | ICD-10-CM | POA: Diagnosis not present

## 2019-11-16 DIAGNOSIS — E103492 Type 1 diabetes mellitus with severe nonproliferative diabetic retinopathy without macular edema, left eye: Secondary | ICD-10-CM | POA: Diagnosis not present

## 2019-11-16 DIAGNOSIS — H353132 Nonexudative age-related macular degeneration, bilateral, intermediate dry stage: Secondary | ICD-10-CM | POA: Diagnosis not present

## 2019-11-16 DIAGNOSIS — H35373 Puckering of macula, bilateral: Secondary | ICD-10-CM | POA: Diagnosis not present

## 2019-12-03 ENCOUNTER — Other Ambulatory Visit: Payer: Self-pay

## 2019-12-04 ENCOUNTER — Encounter: Payer: Self-pay | Admitting: Family Medicine

## 2019-12-04 ENCOUNTER — Ambulatory Visit (INDEPENDENT_AMBULATORY_CARE_PROVIDER_SITE_OTHER): Payer: Medicare HMO | Admitting: Family Medicine

## 2019-12-04 VITALS — BP 138/72 | HR 65 | Ht 67.0 in | Wt 180.4 lb

## 2019-12-04 DIAGNOSIS — E1159 Type 2 diabetes mellitus with other circulatory complications: Secondary | ICD-10-CM | POA: Diagnosis not present

## 2019-12-04 DIAGNOSIS — E119 Type 2 diabetes mellitus without complications: Secondary | ICD-10-CM

## 2019-12-04 DIAGNOSIS — E782 Mixed hyperlipidemia: Secondary | ICD-10-CM | POA: Diagnosis not present

## 2019-12-04 DIAGNOSIS — E1169 Type 2 diabetes mellitus with other specified complication: Secondary | ICD-10-CM

## 2019-12-04 DIAGNOSIS — E785 Hyperlipidemia, unspecified: Secondary | ICD-10-CM

## 2019-12-04 DIAGNOSIS — I10 Essential (primary) hypertension: Secondary | ICD-10-CM

## 2019-12-04 DIAGNOSIS — H3560 Retinal hemorrhage, unspecified eye: Secondary | ICD-10-CM

## 2019-12-04 LAB — BAYER DCA HB A1C WAIVED: HB A1C (BAYER DCA - WAIVED): 10.8 % — ABNORMAL HIGH (ref <7.0)

## 2019-12-04 MED ORDER — OZEMPIC (0.25 OR 0.5 MG/DOSE) 2 MG/1.5ML ~~LOC~~ SOPN: 0.5000 mg | PEN_INJECTOR | SUBCUTANEOUS | 3 refills | Status: DC

## 2019-12-04 NOTE — Progress Notes (Signed)
BP 138/72   Pulse 65   Ht '5\' 7"'  (1.702 m)   Wt 180 lb 6 oz (81.8 kg)   SpO2 99%   BMI 28.25 kg/m    Subjective:   Patient ID: George [name], male    DOB: November 24, 1945, 74 y.o.   MRN: [id number]  HPI: George [name] is a 74 y.o. male presenting on 12/04/2019 for Discuss Carotids (Sees [facility]?)   HPI Per patient he saw his retina specialist and said that he had retinal hemorrhages in one of his 2 eyes but not both but he cannot recall which eye it was and they recommended going for carotid ultrasound, we will go ahead and order the carotid ultrasound.  He does have blurred vision in the right eye he also has a cataract there and they are being evaluated for that as well.  He is also going to be evaluated for glaucoma.  Type 2 diabetes mellitus Patient comes in today for recheck of his diabetes. Patient has been currently taking glimepiride and Metformin. Patient is currently on an ACE inhibitor/ARB. Patient has seen an ophthalmologist this year. Patient denies any issues with their feet.  Patient does have diabetic retinopathy and also been recently told that he has retinal hemorrhages that could be from that or hypertension and want to get a carotid ultrasound.  Hypertension Patient is currently on lisinopril, and their blood pressure today is 138/72. Patient denies any lightheadedness or dizziness. Patient denies headaches, blurred vision, chest pains, shortness of breath, or weakness. Denies any side effects from medication and is content with current medication.   Hyperlipidemia Patient is coming in for recheck of his hyperlipidemia. The patient is currently taking pravastatin. They deny any issues with myalgias or history of liver damage from it. They deny any focal numbness or weakness or chest pain.   Relevant past medical, surgical, family and social history reviewed and updated as indicated. Interim medical history since our last visit reviewed. Allergies and  medications reviewed and updated.  Review of Systems  Constitutional: Negative for chills and fever.  Eyes: Positive for visual disturbance.  Respiratory: Negative for shortness of breath and wheezing.   Cardiovascular: Negative for chest pain and leg swelling.  Musculoskeletal: Negative for back pain and gait problem.  Skin: Negative for rash.  Neurological: Negative for dizziness, weakness and light-headedness.  All other systems reviewed and are negative.   Per HPI unless specifically indicated above   Allergies as of 12/04/2019   No Known Allergies     Medication List       Accurate as of December 04, 2019 10:17 AM. If you have any questions, ask your nurse or doctor.        aspirin 81 MG tablet Take 81 mg by mouth daily.   calcium carbonate 600 MG Tabs tablet Commonly known as: OS-CAL Take 600 mg by mouth daily with breakfast.   CINNAMON PO Take 1,000 mg by mouth daily.   Clear Eyes for Dry Eyes 1-0.25 % Soln Generic drug: Carboxymethylcellul-Glycerin Place 1-2 drops into both eyes daily as needed.   fish oil-omega-3 fatty acids 1000 MG capsule Take 2 g by mouth daily.   glimepiride 4 MG tablet Commonly known as: AMARYL 2 tablets daily before breakfast   lisinopril 20 MG tablet Commonly known as: ZESTRIL Take 1 tablet (20 mg total) by mouth daily.   metFORMIN 1000 MG tablet Commonly known as: GLUCOPHAGE Take 1 tablet (1,000 mg total) by mouth 2 (  two) times daily with a meal.   pravastatin 80 MG tablet Commonly known as: PRAVACHOL Take 1 tablet (80 mg total) by mouth daily.   Trulicity 3.81 WE/9.9BZ Sopn Generic drug: Dulaglutide Inject 0.75 mg into the skin once a week.        Objective:   BP 138/72   Pulse 65   Ht '5\' 7"'  (1.702 m)   Wt 180 lb 6 oz (81.8 kg)   SpO2 99%   BMI 28.25 kg/m   Wt Readings from Last 3 Encounters:  12/04/19 180 lb 6 oz (81.8 kg)  03/23/19 180 lb 6.4 oz (81.8 kg)  12/15/18 177 lb 9.6 oz (80.6 kg)    Physical Exam  Vitals and nursing note reviewed.  Constitutional:      General: He is not in acute distress.    Appearance: He is well-developed. He is not diaphoretic.  Eyes:     General: No scleral icterus.    Conjunctiva/sclera: Conjunctivae normal.  Neck:     Thyroid: No thyromegaly.  Cardiovascular:     Rate and Rhythm: Normal rate and regular rhythm.     Heart sounds: Normal heart sounds. No murmur.  Pulmonary:     Effort: Pulmonary effort is normal. No respiratory distress.     Breath sounds: Normal breath sounds. No wheezing.  Musculoskeletal:        General: Normal range of motion.     Cervical back: Neck supple.  Lymphadenopathy:     Cervical: No cervical adenopathy.  Skin:    General: Skin is warm and dry.     Findings: No rash.  Neurological:     Mental Status: He is alert and oriented to person, place, and time.     Coordination: Coordination normal.  Psychiatric:        Behavior: Behavior normal.     A1c 10.8  Assessment & Plan:   Problem List Items Addressed This Visit      Cardiovascular and Mediastinum   Hypertension associated with diabetes ([place])   Relevant Medications   Semaglutide,0.25 or 0.5MG/DOS, (OZEMPIC, 0.25 OR 0.5 MG/DOSE,) 2 MG/1.5ML SOPN   Other Relevant Orders   CMP14+EGFR     Endocrine   Type 2 diabetes mellitus not at goal [facility]) - Primary   Relevant Medications   Semaglutide,0.25 or 0.5MG/DOS, (OZEMPIC, 0.25 OR 0.5 MG/DOSE,) 2 MG/1.5ML SOPN   Other Relevant Orders   Bayer DCA Hb A1c Waived   CBC with Differential/Platelet   CMP14+EGFR   Mixed hyperlipidemia due to type 2 diabetes mellitus (HCC)   Relevant Medications   Semaglutide,0.25 or 0.5MG/DOS, (OZEMPIC, 0.25 OR 0.5 MG/DOSE,) 2 MG/1.5ML SOPN   Other Relevant Orders   CMP14+EGFR   Lipid panel   Type 2 diabetes mellitus with hyperlipidemia (HCC)   Relevant Medications   Semaglutide,0.25 or 0.5MG/DOS, (OZEMPIC, 0.25 OR 0.5 MG/DOSE,) 2 MG/1.5ML SOPN   Other Relevant Orders   CBC with  Differential/Platelet   CMP14+EGFR    Other Visit Diagnoses    Retinal hemorrhage, unspecified laterality       Relevant Orders   CMP14+EGFR   US Carotid Duplex Bilateral      A1c is worsened, will add Ozempic, added carotid ultrasound because of ophthalmology's recommendations  We will check the rest of his blood work today, ordered carotid ultrasound. Follow up plan: Return in about 3 months (around 03/05/2020), or if symptoms worsen or fail to improve, for Diabetes recheck.  Counseling provided for all of the vaccine components Orders Placed  This Encounter  Procedures  . [facility] Hb A1c [place], MD [facility] 12/04/2019, 10:17 AM

## 2019-12-05 ENCOUNTER — Other Ambulatory Visit: Payer: Self-pay

## 2019-12-05 ENCOUNTER — Ambulatory Visit (INDEPENDENT_AMBULATORY_CARE_PROVIDER_SITE_OTHER): Payer: Medicare HMO

## 2019-12-05 DIAGNOSIS — Z0000 Encounter for general adult medical examination without abnormal findings: Secondary | ICD-10-CM | POA: Diagnosis not present

## 2019-12-05 LAB — CMP14+EGFR
ALT: 21 IU/L (ref 0–44)
AST: 17 IU/L (ref 0–40)
Albumin/Globulin Ratio: 1.9 (ref 1.2–2.2)
Albumin: 4.3 g/dL (ref 3.7–4.7)
Alkaline Phosphatase: 60 IU/L (ref 39–117)
BUN/Creatinine Ratio: 13 (ref 10–24)
BUN: 13 mg/dL (ref 8–27)
Bilirubin Total: 0.3 mg/dL (ref 0.0–1.2)
CO2: 24 mmol/L (ref 20–29)
Calcium: 10.2 mg/dL (ref 8.6–10.2)
Chloride: 100 mmol/L (ref 96–106)
Creatinine, Ser: 0.98 mg/dL (ref 0.76–1.27)
GFR calc Af Amer: 87 mL/min/{1.73_m2} (ref >59)
GFR calc non Af Amer: 76 mL/min/{1.73_m2} (ref >59)
Globulin, Total: 2.3 g/dL (ref 1.5–4.5)
Glucose: 100 mg/dL — ABNORMAL HIGH (ref 65–99)
Potassium: 4.6 mmol/L (ref 3.5–5.2)
Sodium: 140 mmol/L (ref 134–144)
Total Protein: 6.6 g/dL (ref 6.0–8.5)

## 2019-12-05 LAB — CBC WITH DIFFERENTIAL/PLATELET
Basophils Absolute: 0 10*3/uL (ref 0.0–0.2)
Basos: 1 %
EOS (ABSOLUTE): 0.1 10*3/uL (ref 0.0–0.4)
Eos: 1 %
Hematocrit: 41.6 % (ref 37.5–51.0)
Hemoglobin: 14 g/dL (ref 13.0–17.7)
Immature Grans (Abs): 0 10*3/uL (ref 0.0–0.1)
Immature Granulocytes: 0 %
Lymphocytes Absolute: 2 10*3/uL (ref 0.7–3.1)
Lymphs: 30 %
MCH: 28.9 pg (ref 26.6–33.0)
MCHC: 33.7 g/dL (ref 31.5–35.7)
MCV: 86 fL (ref 79–97)
Monocytes Absolute: 0.4 10*3/uL (ref 0.1–0.9)
Monocytes: 6 %
Neutrophils Absolute: 4 10*3/uL (ref 1.4–7.0)
Neutrophils: 62 %
Platelets: 226 10*3/uL (ref 150–450)
RBC: 4.84 x10E6/uL (ref 4.14–5.80)
RDW: 12.7 % (ref 11.6–15.4)
WBC: 6.5 10*3/uL (ref 3.4–10.8)

## 2019-12-05 LAB — LIPID PANEL
Chol/HDL Ratio: 4.8 ratio (ref 0.0–5.0)
Cholesterol, Total: 192 mg/dL (ref 100–199)
HDL: 40 mg/dL (ref >39)
LDL Chol Calc (NIH): 126 mg/dL — ABNORMAL HIGH (ref 0–99)
Triglycerides: 144 mg/dL (ref 0–149)
VLDL Cholesterol Cal: 26 mg/dL (ref 5–40)
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
## 2019-12-10 ENCOUNTER — Telehealth: Payer: Self-pay | Admitting: Family Medicine

## 2019-12-10 NOTE — Telephone Encounter (Signed)
Huntley Dec called from [facility] to see if patient got his Carotid Doppler. Can someone call her back with this info or fax to [phone].

## 2019-12-10 NOTE — Telephone Encounter (Signed)
Appointment has not been scheduled yet. Huntley Dec aware.  Would like copy of results when patient gets Doppler.  Huntley Dec aware note will be made in chart but to call and ask for result if they do not receive.

## 2019-12-27 ENCOUNTER — Other Ambulatory Visit: Payer: Self-pay

## 2019-12-27 ENCOUNTER — Ambulatory Visit (HOSPITAL_COMMUNITY)
Admission: RE | Admit: 2019-12-27 | Discharge: 2019-12-27 | Disposition: A | Discharge status: Home / Self Care | Payer: Medicare HMO | Source: Ambulatory Visit | Attending: Family Medicine | Admitting: Family Medicine

## 2019-12-27 DIAGNOSIS — H3560 Retinal hemorrhage, unspecified eye: Secondary | ICD-10-CM | POA: Diagnosis not present

## 2019-12-27 DIAGNOSIS — I6523 Occlusion and stenosis of bilateral carotid arteries: Secondary | ICD-10-CM | POA: Diagnosis not present

## 2019-12-31 ENCOUNTER — Ambulatory Visit: Payer: Medicare HMO | Admitting: Family Medicine

## 2019-12-31 DIAGNOSIS — H524 Presbyopia: Secondary | ICD-10-CM | POA: Diagnosis not present

## 2019-12-31 DIAGNOSIS — H40022 Open angle with borderline findings, high risk, left eye: Secondary | ICD-10-CM | POA: Diagnosis not present

## 2019-12-31 DIAGNOSIS — H2513 Age-related nuclear cataract, bilateral: Secondary | ICD-10-CM | POA: Diagnosis not present

## 2020-01-09 ENCOUNTER — Other Ambulatory Visit: Payer: Self-pay | Admitting: Family Medicine

## 2020-01-09 DIAGNOSIS — E1169 Type 2 diabetes mellitus with other specified complication: Secondary | ICD-10-CM

## 2020-01-21 ENCOUNTER — Telehealth: Payer: Self-pay | Admitting: Family Medicine

## 2020-01-21 NOTE — Telephone Encounter (Signed)
Faxed records to Dr Lelan Pons

## 2020-01-22 ENCOUNTER — Other Ambulatory Visit: Payer: Self-pay | Admitting: Family Medicine

## 2020-01-22 DIAGNOSIS — E119 Type 2 diabetes mellitus without complications: Secondary | ICD-10-CM

## 2020-01-22 DIAGNOSIS — I152 Hypertension secondary to endocrine disorders: Secondary | ICD-10-CM

## 2020-01-22 DIAGNOSIS — E1159 Type 2 diabetes mellitus with other circulatory complications: Secondary | ICD-10-CM

## 2020-02-07 ENCOUNTER — Telehealth: Payer: Self-pay | Admitting: Family Medicine

## 2020-02-07 DIAGNOSIS — H40022 Open angle with borderline findings, high risk, left eye: Secondary | ICD-10-CM | POA: Diagnosis not present

## 2020-02-07 DIAGNOSIS — H2513 Age-related nuclear cataract, bilateral: Secondary | ICD-10-CM | POA: Diagnosis not present

## 2020-02-07 NOTE — Addendum Note (Signed)
Addended by: Vanice [name] D on: 02/07/2020 04:18 PM   Modules accepted: Orders

## 2020-02-07 NOTE — Telephone Encounter (Signed)
Pt given one sample of 0.25mg  Ozempic. He will also speak with the pharmacist Raynelle Fanning today to help manage cost of medication.

## 2020-02-07 NOTE — Telephone Encounter (Signed)
Sample given [id number] EXP 1/23 Patient and daughter given [employer] patient assistance application for Ozempic and encouraged to fill out  Encouraged patient to bring back application and financial documents.  We need to continue to work on A1c control (10.8%).  Encouraged patient to make an appt with PharmD for DM management

## 2020-03-03 ENCOUNTER — Other Ambulatory Visit: Payer: Self-pay | Admitting: Family Medicine

## 2020-03-03 DIAGNOSIS — E119 Type 2 diabetes mellitus without complications: Secondary | ICD-10-CM

## 2020-03-03 DIAGNOSIS — E785 Hyperlipidemia, unspecified: Secondary | ICD-10-CM

## 2020-03-06 ENCOUNTER — Encounter: Payer: Self-pay | Admitting: Family Medicine

## 2020-03-06 ENCOUNTER — Ambulatory Visit (INDEPENDENT_AMBULATORY_CARE_PROVIDER_SITE_OTHER): Payer: Medicare HMO | Admitting: Family Medicine

## 2020-03-06 ENCOUNTER — Other Ambulatory Visit: Payer: Self-pay

## 2020-03-06 VITALS — BP 123/68 | HR 67 | Temp 97.7°F | Ht 67.0 in | Wt 172.0 lb

## 2020-03-06 DIAGNOSIS — E782 Mixed hyperlipidemia: Secondary | ICD-10-CM

## 2020-03-06 DIAGNOSIS — E1159 Type 2 diabetes mellitus with other circulatory complications: Secondary | ICD-10-CM

## 2020-03-06 DIAGNOSIS — E119 Type 2 diabetes mellitus without complications: Secondary | ICD-10-CM | POA: Diagnosis not present

## 2020-03-06 DIAGNOSIS — E785 Hyperlipidemia, unspecified: Secondary | ICD-10-CM | POA: Diagnosis not present

## 2020-03-06 DIAGNOSIS — E11319 Type 2 diabetes mellitus with unspecified diabetic retinopathy without macular edema: Secondary | ICD-10-CM | POA: Diagnosis not present

## 2020-03-06 DIAGNOSIS — E1169 Type 2 diabetes mellitus with other specified complication: Secondary | ICD-10-CM

## 2020-03-06 DIAGNOSIS — I10 Essential (primary) hypertension: Secondary | ICD-10-CM | POA: Diagnosis not present

## 2020-03-06 LAB — BMP8+EGFR
BUN/Creatinine Ratio: 25 — ABNORMAL HIGH (ref 10–24)
BUN: 29 mg/dL — ABNORMAL HIGH (ref 8–27)
CO2: 24 mmol/L (ref 20–29)
Calcium: 10.3 mg/dL — ABNORMAL HIGH (ref 8.6–10.2)
Chloride: 102 mmol/L (ref 96–106)
Creatinine, Ser: 1.14 mg/dL (ref 0.76–1.27)
GFR calc Af Amer: 72 mL/min/{1.73_m2} (ref >59)
GFR calc non Af Amer: 63 mL/min/{1.73_m2} (ref >59)
Glucose: 107 mg/dL — ABNORMAL HIGH (ref 65–99)
Potassium: 5.2 mmol/L (ref 3.5–5.2)
Sodium: 140 mmol/L (ref 134–144)

## 2020-03-06 LAB — BAYER DCA HB A1C WAIVED: HB A1C (BAYER DCA - WAIVED): 7 % — ABNORMAL HIGH (ref <7.0)

## 2020-03-06 NOTE — Progress Notes (Signed)
BP 123/68   Pulse 67   Temp 97.7 F (36.5 C)   Ht '5\' 7"'  (1.702 m)   Wt 172 lb (78 kg)   SpO2 98%   BMI 26.94 kg/m    Subjective:   Patient ID: George [name], male    DOB: 1946-07-04, 74 y.o.   MRN: [id number]  HPI: George [name] is a 74 y.o. male presenting on 03/06/2020 for Medical Management of Chronic Issues and Diabetes   HPI Type 2 diabetes mellitus Patient comes in today for recheck of his diabetes. Patient has been currently taking glimepiride and metformin and Ozempic. Patient is currently on an ACE inhibitor/ARB. Patient has not seen an ophthalmologist this year. Patient denies any new issues with their feet. The symptom started onset as an adult diabetic retinopathy and hyperlipidemia and hypertension ARE RELATED TO DM   Hypertension Patient is currently on lisinopril, and their blood pressure today is 123/68. Patient denies any lightheadedness or dizziness. Patient denies headaches, blurred vision, chest pains, shortness of breath, or weakness. Denies any side effects from medication and is content with current medication.   Hyperlipidemia Patient is coming in for recheck of his hyperlipidemia. The patient is currently taking pravastatin and fish oil. They deny any issues with myalgias or history of liver damage from it. They deny any focal numbness or weakness or chest pain.    Relevant past medical, surgical, family and social history reviewed and updated as indicated. Interim medical history since our last visit reviewed. Allergies and medications reviewed and updated.  Review of Systems  Constitutional: Negative for chills and fever.  Eyes: Negative for visual disturbance.  Respiratory: Negative for shortness of breath and wheezing.   Cardiovascular: Negative for chest pain and leg swelling.  Musculoskeletal: Negative for back pain and gait problem.  Skin: Negative for rash.  Neurological: Negative for dizziness, weakness, light-headedness and numbness.    All other systems reviewed and are negative.   Per HPI unless specifically indicated above   Allergies as of 03/06/2020   No Known Allergies     Medication List       Accurate as of March 06, 2020  9:15 AM. If you have any questions, ask your nurse or doctor.        STOP taking these medications   Clear Eyes for Dry Eyes 1-0.25 % Soln Generic drug: Carboxymethylcellul-Glycerin Stopped by: Fransisca Kaufmann Shalimar Mcclain, MD     TAKE these medications   aspirin 81 MG tablet Take 81 mg by mouth daily.   calcium carbonate 600 MG Tabs tablet Commonly known as: OS-CAL Take 600 mg by mouth daily with breakfast.   CINNAMON PO Take 1,000 mg by mouth daily.   fish oil-omega-3 fatty acids 1000 MG capsule Take 2 g by mouth daily.   glimepiride 4 MG tablet Commonly known as: AMARYL TAKE 2 TABLETS BY MOUTH EVERY DAY BEFORE BREAKFAST   lisinopril 20 MG tablet Commonly known as: ZESTRIL TAKE 1 TABLET BY MOUTH EVERY DAY   metFORMIN 1000 MG tablet Commonly known as: GLUCOPHAGE TAKE 1 TABLET (1,000 MG TOTAL) BY MOUTH 2 (TWO) TIMES DAILY WITH A MEAL.   Ozempic (0.25 or 0.5 MG/DOSE) 2 MG/1.5ML Sopn Generic drug: Semaglutide(0.25 or 0.5MG/DOS) Inject 0.5 mg into the skin once a week.   pravastatin 80 MG tablet Commonly known as: PRAVACHOL TAKE 1 TABLET BY MOUTH EVERY DAY   SYSTANE HYDRATION PF OP Apply to eye in the morning, at noon, and at bedtime.  Objective:   BP 123/68   Pulse 67   Temp 97.7 F (36.5 C)   Ht '5\' 7"'  (1.702 m)   Wt 172 lb (78 kg)   SpO2 98%   BMI 26.94 kg/m   Wt Readings from Last 3 Encounters:  03/06/20 172 lb (78 kg)  12/04/19 180 lb 6 oz (81.8 kg)  03/23/19 180 lb 6.4 oz (81.8 kg)    Physical Exam Vitals and nursing note reviewed.  Constitutional:      General: He is not in acute distress.    Appearance: He is well-developed. He is not diaphoretic.  Eyes:     General: No scleral icterus.    Conjunctiva/sclera: Conjunctivae normal.  Neck:      Thyroid: No thyromegaly.  Cardiovascular:     Rate and Rhythm: Normal rate and regular rhythm.     Heart sounds: Normal heart sounds. No murmur.  Pulmonary:     Effort: Pulmonary effort is normal. No respiratory distress.     Breath sounds: Normal breath sounds. No wheezing.  Musculoskeletal:        General: Normal range of motion.     Cervical back: Neck supple.  Lymphadenopathy:     Cervical: No cervical adenopathy.  Skin:    General: Skin is warm and dry.     Findings: No rash.  Neurological:     Mental Status: He is alert and oriented to person, place, and time.     Coordination: Coordination normal.  Psychiatric:        Behavior: Behavior normal.     Diabetic Foot Exam - Simple   Simple Foot Form Diabetic Foot exam was performed with the following findings: Yes 03/06/2020  9:26 AM  Visual Inspection No deformities, no ulcerations, no other skin breakdown bilaterally: Yes Sensation Testing Intact to touch and monofilament testing bilaterally: Yes Pulse Check Posterior Tibialis and Dorsalis pulse intact bilaterally: Yes Comments Patient has onychomycosis on all of his toenails but keeps them trimmed nicely and has a callus on his left foot on the bottom of the middle toe but no cracks or open sores.      Assessment & Plan:   Problem List Items Addressed This Visit      Cardiovascular and Mediastinum   Hypertension associated with diabetes ([place])   Relevant Orders   BMP8+EGFR     Endocrine   Type 2 diabetes mellitus not at goal [facility]) - Primary   Relevant Orders   Bayer DCA Hb A1c Waived   BMP8+EGFR   Mixed hyperlipidemia due to type 2 diabetes mellitus ([place])   Diabetic retinopathy ([place])   Relevant Orders   BMP8+EGFR   Type 2 diabetes mellitus with hyperlipidemia ([place])   Relevant Orders   BMP8+EGFR      A1c 7.0, doing a lot better, continue with current medication. Follow up plan: Return in about 3 months (around 06/06/2020), or if symptoms worsen or  fail to improve, for Diabetes recheck.  Counseling provided for all of the vaccine components Orders Placed This Encounter  Procedures  . Bayer [facility] Hb A1c [place], MD [facility] 03/06/2020, 9:15 AM

## 2020-03-28 ENCOUNTER — Other Ambulatory Visit: Payer: Self-pay | Admitting: Family Medicine

## 2020-03-28 DIAGNOSIS — E785 Hyperlipidemia, unspecified: Secondary | ICD-10-CM

## 2020-03-28 DIAGNOSIS — E119 Type 2 diabetes mellitus without complications: Secondary | ICD-10-CM

## 2020-03-28 NOTE — Telephone Encounter (Signed)
Last seen - 03/06/2020

## 2020-06-06 ENCOUNTER — Encounter: Payer: Self-pay | Admitting: Family Medicine

## 2020-06-06 ENCOUNTER — Ambulatory Visit (INDEPENDENT_AMBULATORY_CARE_PROVIDER_SITE_OTHER): Payer: Medicare HMO | Admitting: Family Medicine

## 2020-06-06 ENCOUNTER — Other Ambulatory Visit: Payer: Self-pay

## 2020-06-06 VITALS — BP 128/64 | HR 69 | Temp 98.2°F | Ht 67.0 in | Wt 170.2 lb

## 2020-06-06 DIAGNOSIS — E785 Hyperlipidemia, unspecified: Secondary | ICD-10-CM | POA: Diagnosis not present

## 2020-06-06 DIAGNOSIS — E782 Mixed hyperlipidemia: Secondary | ICD-10-CM

## 2020-06-06 DIAGNOSIS — E1169 Type 2 diabetes mellitus with other specified complication: Secondary | ICD-10-CM

## 2020-06-06 DIAGNOSIS — E11319 Type 2 diabetes mellitus with unspecified diabetic retinopathy without macular edema: Secondary | ICD-10-CM | POA: Diagnosis not present

## 2020-06-06 DIAGNOSIS — I152 Hypertension secondary to endocrine disorders: Secondary | ICD-10-CM

## 2020-06-06 DIAGNOSIS — E1159 Type 2 diabetes mellitus with other circulatory complications: Secondary | ICD-10-CM | POA: Diagnosis not present

## 2020-06-06 DIAGNOSIS — I10 Essential (primary) hypertension: Secondary | ICD-10-CM

## 2020-06-06 DIAGNOSIS — E119 Type 2 diabetes mellitus without complications: Secondary | ICD-10-CM | POA: Diagnosis not present

## 2020-06-06 LAB — BAYER DCA HB A1C WAIVED: HB A1C (BAYER DCA - WAIVED): 6.8 % (ref <7.0)

## 2020-06-06 MED ORDER — OZEMPIC (0.25 OR 0.5 MG/DOSE) 2 MG/1.5ML ~~LOC~~ SOPN: 0.5000 mg | PEN_INJECTOR | SUBCUTANEOUS | 3 refills | Status: DC

## 2020-06-06 MED ORDER — LISINOPRIL 20 MG PO TABS: 20.0000 mg | ORAL_TABLET | Freq: Every day | ORAL | 3 refills | Status: DC

## 2020-06-06 MED ORDER — METFORMIN HCL 1000 MG PO TABS: 1000.0000 mg | ORAL_TABLET | Freq: Two times a day (BID) | ORAL | 3 refills | Status: DC

## 2020-06-06 MED ORDER — PRAVASTATIN SODIUM 80 MG PO TABS: 80.0000 mg | ORAL_TABLET | Freq: Every day | ORAL | 3 refills | Status: DC

## 2020-06-06 MED ORDER — GLIMEPIRIDE 4 MG PO TABS: 4.0000 mg | ORAL_TABLET | Freq: Every day | ORAL | 3 refills | Status: DC

## 2020-06-06 NOTE — Progress Notes (Signed)
BP 128/64   Pulse 69   Temp 98.2 F (36.8 C)   Ht '5\' 7"'  (1.702 m)   Wt 170 lb 4 oz (77.2 kg)   BMI 26.66 kg/m    Subjective:   Patient ID: George [name], male    DOB: 08-30-1946, 74 y.o.   MRN: [id number]  HPI: George [name] is a 74 y.o. male presenting on 06/06/2020 for Medical Management of Chronic Issues, Diabetes, and Hyperlipidemia   HPI Type 2 diabetes mellitus Patient comes in today for recheck of his diabetes. Patient has been currently taking glimepiride and Metformin and Ozempic. Patient is currently on an ACE inhibitor/ARB. Patient has seen an ophthalmologist this year. Patient denies any issues with their feet. The symptom started onset as an adult hyperlipidemia and hypertension and retinopathy ARE RELATED TO DM   Hyperlipidemia Patient is coming in for recheck of his hyperlipidemia. The patient is currently taking fish oil and pravastatin. They deny any issues with myalgias or history of liver damage from it. They deny any focal numbness or weakness or chest pain.   Hypertension Patient is currently on lisinopril, and their blood pressure today is 128/64. Patient denies any lightheadedness or dizziness. Patient denies headaches, blurred vision, chest pains, shortness of breath, or weakness. Denies any side effects from medication and is content with current medication.   Relevant past medical, surgical, family and social history reviewed and updated as indicated. Interim medical history since our last visit reviewed. Allergies and medications reviewed and updated.  Review of Systems  Constitutional: Negative for chills and fever.  Respiratory: Negative for shortness of breath and wheezing.   Cardiovascular: Negative for chest pain and leg swelling.  Musculoskeletal: Negative for back pain and gait problem.  Skin: Negative for rash.  Neurological: Negative for dizziness, weakness and light-headedness.  All other systems reviewed and are negative.   Per HPI  unless specifically indicated above   Allergies as of 06/06/2020   No Known Allergies     Medication List       Accurate as of June 06, 2020  9:08 AM. If you have any questions, ask your nurse or doctor.        aspirin 81 MG tablet Take 81 mg by mouth daily.   calcium carbonate 600 MG Tabs tablet Commonly known as: OS-CAL Take 600 mg by mouth daily with breakfast.   CINNAMON PO Take 1,000 mg by mouth daily.   fish oil-omega-3 fatty acids 1000 MG capsule Take 2 g by mouth daily.   glimepiride 4 MG tablet Commonly known as: AMARYL Take 1 tablet (4 mg total) by mouth daily with breakfast. What changed: See the new instructions. Changed by: Fransisca Kaufmann [place], MD   lisinopril 20 MG tablet Commonly known as: ZESTRIL Take 1 tablet (20 mg total) by mouth daily.   metFORMIN 1000 MG tablet Commonly known as: GLUCOPHAGE Take 1 tablet (1,000 mg total) by mouth 2 (two) times daily with a meal.   Ozempic (0.25 or 0.5 MG/DOSE) 2 MG/1.5ML Sopn Generic drug: Semaglutide(0.25 or 0.5MG/DOS) Inject 0.375 mLs (0.5 mg total) into the skin once a week.   pravastatin 80 MG tablet Commonly known as: PRAVACHOL Take 1 tablet (80 mg total) by mouth daily.   SYSTANE HYDRATION PF OP Apply to eye in the morning, at noon, and at bedtime.        Objective:   BP 128/64   Pulse 69   Temp 98.2 F (36.8 C)   Ht  '5\' 7"'  (1.702 m)   Wt 170 lb 4 oz (77.2 kg)   BMI 26.66 kg/m   Wt Readings from Last 3 Encounters:  06/06/20 170 lb 4 oz (77.2 kg)  03/06/20 172 lb (78 kg)  12/04/19 180 lb 6 oz (81.8 kg)    Physical Exam Vitals and nursing note reviewed.  Constitutional:      General: He is not in acute distress.    Appearance: He is well-developed. He is not diaphoretic.  Eyes:     General: No scleral icterus.    Conjunctiva/sclera: Conjunctivae normal.  Neck:     Thyroid: No thyromegaly.  Cardiovascular:     Rate and Rhythm: Normal rate and regular rhythm.     Heart sounds:  Normal heart sounds. No murmur heard.   Pulmonary:     Effort: Pulmonary effort is normal. No respiratory distress.     Breath sounds: Normal breath sounds. No wheezing.  Musculoskeletal:        General: No swelling. Normal range of motion.     Cervical back: Neck supple.  Lymphadenopathy:     Cervical: No cervical adenopathy.  Skin:    General: Skin is warm and dry.     Findings: No rash.  Neurological:     Mental Status: He is alert and oriented to person, place, and time.     Coordination: Coordination normal.  Psychiatric:        Behavior: Behavior normal.     A1c is 6.8 which is looking good, no changes, continue  current medication  Assessment & Plan:   Problem List Items Addressed This Visit      Cardiovascular and Mediastinum   Hypertension associated with diabetes ([place])   Relevant Medications   Semaglutide,0.25 or 0.5MG/DOS, (OZEMPIC, 0.25 OR 0.5 MG/DOSE,) 2 MG/1.5ML SOPN   pravastatin (PRAVACHOL) 80 MG tablet   metFORMIN (GLUCOPHAGE) 1000 MG tablet   glimepiride (AMARYL) 4 MG tablet   lisinopril (ZESTRIL) 20 MG tablet     Endocrine   Type 2 diabetes mellitus not at goal [facility])   Relevant Medications   Semaglutide,0.25 or 0.5MG/DOS, (OZEMPIC, 0.25 OR 0.5 MG/DOSE,) 2 MG/1.5ML SOPN   pravastatin (PRAVACHOL) 80 MG tablet   metFORMIN (GLUCOPHAGE) 1000 MG tablet   glimepiride (AMARYL) 4 MG tablet   lisinopril (ZESTRIL) 20 MG tablet   Other Relevant Orders   CMP14+EGFR   Mixed hyperlipidemia due to type 2 diabetes mellitus (HCC)   Relevant Medications   Semaglutide,0.25 or 0.5MG/DOS, (OZEMPIC, 0.25 OR 0.5 MG/DOSE,) 2 MG/1.5ML SOPN   pravastatin (PRAVACHOL) 80 MG tablet   metFORMIN (GLUCOPHAGE) 1000 MG tablet   glimepiride (AMARYL) 4 MG tablet   lisinopril (ZESTRIL) 20 MG tablet   Other Relevant Orders   Lipid panel   Diabetic retinopathy (HCC)   Relevant Medications   Semaglutide,0.25 or 0.5MG/DOS, (OZEMPIC, 0.25 OR 0.5 MG/DOSE,) 2 MG/1.5ML SOPN    pravastatin (PRAVACHOL) 80 MG tablet   metFORMIN (GLUCOPHAGE) 1000 MG tablet   glimepiride (AMARYL) 4 MG tablet   lisinopril (ZESTRIL) 20 MG tablet   Other Relevant Orders   CBC with Differential/Platelet   Type 2 diabetes mellitus with hyperlipidemia (HCC) - Primary   Relevant Medications   Semaglutide,0.25 or 0.5MG/DOS, (OZEMPIC, 0.25 OR 0.5 MG/DOSE,) 2 MG/1.5ML SOPN   pravastatin (PRAVACHOL) 80 MG tablet   metFORMIN (GLUCOPHAGE) 1000 MG tablet   glimepiride (AMARYL) 4 MG tablet   lisinopril (ZESTRIL) 20 MG tablet   Other Relevant Orders   Bayer DCA Hb A1c  Waived   CBC with Differential/Platelet      continue current medication and ozempic Follow up plan: Return in about 3 months (around 09/05/2020), or if symptoms worsen or fail to improve.  Counseling provided for all of the vaccine components Orders Placed This Encounter  Procedures  . Bayer DCA Hb A1c Waived  . CBC with Differential/Platelet  . CMP14+EGFR  . Lipid panel    Caryl Pina, MD [facility] 06/06/2020, 9:08 AM

## 2020-06-07 LAB — CMP14+EGFR
ALT: 22 IU/L (ref 0–44)
AST: 18 IU/L (ref 0–40)
Albumin/Globulin Ratio: 2.1 (ref 1.2–2.2)
Albumin: 4.5 g/dL (ref 3.7–4.7)
Alkaline Phosphatase: 51 IU/L (ref 48–121)
BUN/Creatinine Ratio: 25 — ABNORMAL HIGH (ref 10–24)
BUN: 29 mg/dL — ABNORMAL HIGH (ref 8–27)
Bilirubin Total: 0.3 mg/dL (ref 0.0–1.2)
CO2: 24 mmol/L (ref 20–29)
Calcium: 10.1 mg/dL (ref 8.6–10.2)
Chloride: 104 mmol/L (ref 96–106)
Creatinine, Ser: 1.14 mg/dL (ref 0.76–1.27)
GFR calc Af Amer: 72 mL/min/{1.73_m2} (ref >59)
GFR calc non Af Amer: 63 mL/min/{1.73_m2} (ref >59)
Globulin, Total: 2.1 g/dL (ref 1.5–4.5)
Glucose: 91 mg/dL (ref 65–99)
Potassium: 4.9 mmol/L (ref 3.5–5.2)
Sodium: 143 mmol/L (ref 134–144)
Total Protein: 6.6 g/dL (ref 6.0–8.5)

## 2020-06-07 LAB — CBC WITH DIFFERENTIAL/PLATELET
Basophils Absolute: 0.1 10*3/uL (ref 0.0–0.2)
Basos: 1 %
EOS (ABSOLUTE): 0.1 10*3/uL (ref 0.0–0.4)
Eos: 2 %
Hematocrit: 38.4 % (ref 37.5–51.0)
Hemoglobin: 12.7 g/dL — ABNORMAL LOW (ref 13.0–17.7)
Immature Grans (Abs): 0 10*3/uL (ref 0.0–0.1)
Immature Granulocytes: 0 %
Lymphocytes Absolute: 2.4 10*3/uL (ref 0.7–3.1)
Lymphs: 30 %
MCH: 29.4 pg (ref 26.6–33.0)
MCHC: 33.1 g/dL (ref 31.5–35.7)
MCV: 89 fL (ref 79–97)
Monocytes Absolute: 0.5 10*3/uL (ref 0.1–0.9)
Monocytes: 6 %
Neutrophils Absolute: 4.9 10*3/uL (ref 1.4–7.0)
Neutrophils: 61 %
Platelets: 252 10*3/uL (ref 150–450)
RBC: 4.32 x10E6/uL (ref 4.14–5.80)
RDW: 13.1 % (ref 11.6–15.4)
WBC: 7.9 10*3/uL (ref 3.4–10.8)

## 2020-06-07 LAB — LIPID PANEL
Chol/HDL Ratio: 4 ratio (ref 0.0–5.0)
Cholesterol, Total: 166 mg/dL (ref 100–199)
HDL: 41 mg/dL (ref >39)
LDL Chol Calc (NIH): 104 mg/dL — ABNORMAL HIGH (ref 0–99)
Triglycerides: 116 mg/dL (ref 0–149)
VLDL Cholesterol Cal: 21 mg/dL (ref 5–40)

## 2020-06-17 DIAGNOSIS — H40013 Open angle with borderline findings, low risk, bilateral: Secondary | ICD-10-CM | POA: Diagnosis not present

## 2020-06-17 DIAGNOSIS — H2513 Age-related nuclear cataract, bilateral: Secondary | ICD-10-CM | POA: Diagnosis not present

## 2020-06-22 ENCOUNTER — Other Ambulatory Visit: Payer: Self-pay | Admitting: Family Medicine

## 2020-06-22 DIAGNOSIS — E119 Type 2 diabetes mellitus without complications: Secondary | ICD-10-CM

## 2020-06-22 DIAGNOSIS — E785 Hyperlipidemia, unspecified: Secondary | ICD-10-CM

## 2020-06-23 ENCOUNTER — Ambulatory Visit (INDEPENDENT_AMBULATORY_CARE_PROVIDER_SITE_OTHER): Payer: Medicare HMO | Admitting: Pharmacist

## 2020-06-23 ENCOUNTER — Other Ambulatory Visit: Payer: Self-pay

## 2020-06-23 DIAGNOSIS — E119 Type 2 diabetes mellitus without complications: Secondary | ICD-10-CM | POA: Diagnosis not present

## 2020-06-23 NOTE — Progress Notes (Signed)
° ° °  06/23/2020 Name: George [name] MRN: [id number] DOB: 12/24/1945   S:  79 yoM Presents for diabetes evaluation, education, and management Patient was referred and last seen by Primary Care Provider on 06/06/20.  Insurance coverage/medication affordability: [employer]are  Patient reports adherence with medications.  Current diabetes medications include: GLIMEPIRIDE, OZEMPIC, METFORMIN  Current hypertension medications include: LISINOPRIL Goal 130/80  Current hyperlipidemia medications include: PRAVASTATIN   Patient denies hypoglycemic events.   Patient reported dietary habits: Eats 2-3 meals/day Discussed meal planning options and Plate method for healthy eating  Avoid sugary drinks and desserts  Incorporate balanced protein, non starchy veggies, 1 serving of carbohydrate with each meal  Increase water intake  Increase physical activity as able  Patient-reported exercise habits: patient still active and works around the house   O:  Lab Results  Component Value Date   HGBA1C 6.8 06/06/2020    Lipid Panel     Component Value Date/Time   CHOL 166 06/06/2020 0912   CHOL 198 03/14/2013 1039   TRIG 116 06/06/2020 0912   TRIG 113 03/14/2013 1039   HDL 41 06/06/2020 0912   HDL 45 03/14/2013 1039   CHOLHDL 4.0 06/06/2020 0912   LDLCALC 104 (H) 06/06/2020 0912   LDLCALC 130 (H) 03/14/2013 1039    Home fasting blood sugars: 90-120 (patient does not check daily, but 3 times weekly)  2 hour post-meal/random blood sugars: n/a.    A/P:  Diabetes T2DM currently uncontrolled. Patient is adherent with medication.  -CONTINUE METFORMIN, GLIMEPIRIDE  -CONTINUE OZEMPIC  PATIENT ASSISTANCE COMPLETED FOR NOVO NORDISK, MEDICATION TO SHIP HERE in 4 weeks  -Denies h/o thyroid/medullary cancer  -Extensively discussed pathophysiology of diabetes, recommended lifestyle interventions, dietary effects on blood sugar control  -Counseled on s/sx of and management of  hypoglycemia  -Next A1C anticipated 09-05-20.    Written patient instructions provided.  Total time in face to face counseling 30 minutes.   Follow up Pharmacist/PCP Clinic Visit ON 09/05/20.  Kieth Brightly, PharmD, BCPS Clinical Pharmacist, [facility]  II Phone [phone]

## 2020-07-02 ENCOUNTER — Telehealth: Payer: Self-pay | Admitting: Pharmacist

## 2020-07-02 NOTE — Telephone Encounter (Signed)
PATIENT APPROVED FOR NOVO NORDISK PATIENT ASSISTANCE PROGRAM UNTIL 10/03/2020  MEDICATION TO SHIP TO PCP OFFICE IN 10-14 BUSINESS days  PATIENT APPROVED ON 06/30/20

## 2020-07-21 ENCOUNTER — Telehealth: Payer: Self-pay | Admitting: Pharmacist

## 2020-07-21 NOTE — Telephone Encounter (Signed)
Patient's 4 month supply of Novo Nordisk patient assistance medication (ozempic) have arrived and are in refrigerator    Patient called and verbalizes understanding  

## 2020-09-05 ENCOUNTER — Ambulatory Visit (INDEPENDENT_AMBULATORY_CARE_PROVIDER_SITE_OTHER): Payer: Medicare HMO | Admitting: Family Medicine

## 2020-09-05 ENCOUNTER — Encounter: Payer: Self-pay | Admitting: Family Medicine

## 2020-09-05 ENCOUNTER — Other Ambulatory Visit: Payer: Self-pay

## 2020-09-05 VITALS — BP 141/66 | HR 71 | Temp 97.9°F | Ht 67.0 in | Wt 173.0 lb

## 2020-09-05 DIAGNOSIS — E11319 Type 2 diabetes mellitus with unspecified diabetic retinopathy without macular edema: Secondary | ICD-10-CM

## 2020-09-05 DIAGNOSIS — E782 Mixed hyperlipidemia: Secondary | ICD-10-CM | POA: Diagnosis not present

## 2020-09-05 DIAGNOSIS — I152 Hypertension secondary to endocrine disorders: Secondary | ICD-10-CM | POA: Diagnosis not present

## 2020-09-05 DIAGNOSIS — E785 Hyperlipidemia, unspecified: Secondary | ICD-10-CM

## 2020-09-05 DIAGNOSIS — E119 Type 2 diabetes mellitus without complications: Secondary | ICD-10-CM | POA: Diagnosis not present

## 2020-09-05 DIAGNOSIS — E1169 Type 2 diabetes mellitus with other specified complication: Secondary | ICD-10-CM

## 2020-09-05 DIAGNOSIS — E1159 Type 2 diabetes mellitus with other circulatory complications: Secondary | ICD-10-CM

## 2020-09-05 DIAGNOSIS — Z23 Encounter for immunization: Secondary | ICD-10-CM

## 2020-09-05 LAB — BAYER DCA HB A1C WAIVED: HB A1C (BAYER DCA - WAIVED): 7 % — ABNORMAL HIGH (ref <7.0)

## 2020-09-05 NOTE — Progress Notes (Signed)
BP (!) 141/66   Pulse 71   Temp 97.9 F (36.6 C)   Ht '5\' 7"'  (1.702 m)   Wt 173 lb (78.5 kg)   SpO2 97%   BMI 27.10 kg/m    Subjective:   Patient ID: George [name], male    DOB: 01/31/1946, 74 y.o.   MRN: [id number]  HPI: George [name] is a 74 y.o. male presenting on 09/05/2020 for Medical Management of Chronic Issues and Diabetes   HPI Type 2 diabetes mellitus Patient comes in today for recheck of his diabetes. Patient has been currently taking Metformin and glimepiride and Ozempic, A1c is 7.0. Patient is currently on an ACE inhibitor/ARB. Patient has seen an ophthalmologist this year. Patient denies any issues with their feet. The symptom started onset as an adult hyperlipidemia and hypertension and retinopathy ARE RELATED TO DM   Hyperlipidemia Patient is coming in for recheck of his hyperlipidemia. The patient is currently taking pravastatin. They deny any issues with myalgias or history of liver damage from it. They deny any focal numbness or weakness or chest pain.   Hypertension Patient is currently on lisinopril, and their blood pressure today is 141/66. Patient denies any lightheadedness or dizziness. Patient denies headaches, blurred vision, chest pains, shortness of breath, or weakness. Denies any side effects from medication and is content with current medication.   Relevant past medical, surgical, family and social history reviewed and updated as indicated. Interim medical history since our last visit reviewed. Allergies and medications reviewed and updated.  Review of Systems  Constitutional: Negative for chills and fever.  Eyes: Negative for visual disturbance.  Respiratory: Negative for shortness of breath and wheezing.   Cardiovascular: Negative for chest pain and leg swelling.  Musculoskeletal: Negative for back pain and gait problem.  Skin: Negative for rash.  Neurological: Negative for dizziness, weakness and light-headedness.  Psychiatric/Behavioral:  Positive for confusion (Patient has some increasing memory issues per his wife, we discussed possibly trying some medications for it and they do not want to today but will discuss it in the future.).  All other systems reviewed and are negative.   Per HPI unless specifically indicated above   Allergies as of 09/05/2020   No Known Allergies     Medication List       Accurate as of September 05, 2020  8:58 AM. If you have any questions, ask your nurse or doctor.        aspirin 81 MG tablet Take 81 mg by mouth daily.   calcium carbonate 600 MG Tabs tablet Commonly known as: OS-CAL Take 600 mg by mouth daily with breakfast.   CINNAMON PO Take 1,000 mg by mouth daily.   fish oil-omega-3 fatty acids 1000 MG capsule Take 2 g by mouth daily.   glimepiride 4 MG tablet Commonly known as: AMARYL Take 1 tablet (4 mg total) by mouth daily with breakfast.   lisinopril 20 MG tablet Commonly known as: ZESTRIL Take 1 tablet (20 mg total) by mouth daily.   metFORMIN 1000 MG tablet Commonly known as: GLUCOPHAGE Take 1 tablet (1,000 mg total) by mouth 2 (two) times daily with a meal.   Ozempic (0.25 or 0.5 MG/DOSE) 2 MG/1.5ML Sopn Generic drug: Semaglutide(0.25 or 0.5MG/DOS) Inject 0.375 mLs (0.5 mg total) into the skin once a week.   pravastatin 80 MG tablet Commonly known as: PRAVACHOL Take 1 tablet (80 mg total) by mouth daily.   SYSTANE HYDRATION PF OP Apply to eye in the  morning, at noon, and at bedtime.        Objective:   BP (!) 141/66   Pulse 71   Temp 97.9 F (36.6 C)   Ht '5\' 7"'  (1.702 m)   Wt 173 lb (78.5 kg)   SpO2 97%   BMI 27.10 kg/m   Wt Readings from Last 3 Encounters:  09/05/20 173 lb (78.5 kg)  06/06/20 170 lb 4 oz (77.2 kg)  03/06/20 172 lb (78 kg)    Physical Exam Vitals and nursing note reviewed.  Constitutional:      General: He is not in acute distress.    Appearance: He is well-developed. He is not diaphoretic.  Eyes:     General: No  scleral icterus.    Conjunctiva/sclera: Conjunctivae normal.  Neck:     Thyroid: No thyromegaly.  Cardiovascular:     Rate and Rhythm: Normal rate and regular rhythm.     Heart sounds: Normal heart sounds. No murmur heard.   Pulmonary:     Effort: Pulmonary effort is normal. No respiratory distress.     Breath sounds: Normal breath sounds. No wheezing.  Musculoskeletal:        General: Normal range of motion.     Cervical back: Neck supple.  Lymphadenopathy:     Cervical: No cervical adenopathy.  Skin:    General: Skin is warm and dry.     Findings: No rash.  Neurological:     Mental Status: He is alert and oriented to person, place, and time.     Coordination: Coordination normal.  Psychiatric:        Behavior: Behavior normal.     Results for orders placed or performed in visit on 09/05/20  Bayer DCA Hb A1c Waived  Result Value Ref Range   HB A1C (BAYER DCA - WAIVED) 7.0 (H) <7.0 %    Assessment & Plan:   Problem List Items Addressed This Visit      Cardiovascular and Mediastinum   Hypertension associated with diabetes ([place])   Relevant Orders   CMP14+EGFR     Endocrine   Type 2 diabetes mellitus not at goal [facility])   Relevant Orders   CMP14+EGFR   Mixed hyperlipidemia due to type 2 diabetes mellitus ([place])   Relevant Orders   Lipid panel   Diabetic retinopathy ([place])   Type 2 diabetes mellitus with hyperlipidemia ([place])   Relevant Orders   CMP14+EGFR    Other Visit Diagnoses    Type 2 diabetes mellitus without complication, without long-term current use of insulin ([place])    -  Primary   Relevant Orders   Bayer DCA Hb A1c Waived (Completed)   Bayer DCA Hb A1c Waived   CBC with Differential/Platelet   Need for immunization against influenza       Relevant Orders   Flu Vaccine QUAD High Dose(Fluad) (Completed)      Continue current medication, focus on diet so that the A1c does not keep creeping up.  Allowing permissive hypertension and doing well on blood  pressure. Follow up plan: Return in about 3 months (around 12/04/2020), or if symptoms worsen or fail to improve, for Diabetes and hypertension and cholesterol.  Counseling provided for all of the vaccine components Orders Placed This Encounter  Procedures  . Flu Vaccine QUAD High Dose(Fluad)  . Bayer DCA Hb A1c Waived  . Bayer DCA Hb A1c Waived  . CBC with Differential/Platelet  . CMP14+EGFR  . Lipid panel    Caryl Pina, MD  [place] 09/05/2020, 8:58 AM

## 2020-10-15 ENCOUNTER — Other Ambulatory Visit: Payer: Self-pay

## 2020-10-15 ENCOUNTER — Ambulatory Visit (INDEPENDENT_AMBULATORY_CARE_PROVIDER_SITE_OTHER): Payer: Medicare HMO | Admitting: Family Medicine

## 2020-10-15 ENCOUNTER — Encounter: Payer: Self-pay | Admitting: Family Medicine

## 2020-10-15 VITALS — BP 119/69 | HR 101 | Temp 97.5°F | Resp 20 | Ht 67.0 in | Wt 169.0 lb

## 2020-10-15 DIAGNOSIS — B029 Zoster without complications: Secondary | ICD-10-CM | POA: Diagnosis not present

## 2020-10-15 NOTE — Progress Notes (Signed)
Assessment & Plan:  1. Herpes zoster without complication - Education provided on shingles. Encouraged vaccination at a later time. Patient is outside of window for antiviral treatment. Denies any pain and was therefore not given anything for pain. Continue applying calamine lotion. Cool compresses at night. Wife will remain in another room until vesicles have dried up.   Follow up plan: Return if symptoms worsen or fail to improve.  George Boston, MSN, APRN, FNP-C [facility]  Subjective:   Patient ID: George [name], male    DOB: Jul 24, 1946, 75 y.o.   MRN: [id number]  HPI: George [name] is a 75 y.o. male presenting on 10/15/2020 for Rash (Right side arm and back - first noticed 1 week ago )  Patient is accompanied by his wife who he is okay with being present.  Patient reports a rash to his right arm and across the top of the right side of his back that he noticed 1 week ago.  Patient's wife reports it went down his arm on Saturday, 4 days ago.  He has been applying calamine lotion daily.  He denies any pain.  He does report mild irritation when he lays on that side at night in the bed.  The rash is improving, as it is no longer on a portion of his back that it was.  Patient has never been vaccinated against shingles.   ROS: Negative unless specifically indicated above in HPI.   Relevant past medical history reviewed and updated as indicated.   Allergies and medications reviewed and updated.   Current Outpatient Medications:  .  aspirin 81 MG tablet, Take 81 mg by mouth daily., Disp: , Rfl:  .  calcium carbonate (OS-CAL) 600 MG TABS, Take 600 mg by mouth daily with breakfast. , Disp: , Rfl:  .  CINNAMON PO, Take 1,000 mg by mouth daily. , Disp: , Rfl:  .  fish oil-omega-3 fatty acids 1000 MG capsule, Take 2 g by mouth daily., Disp: , Rfl:  .  glimepiride (AMARYL) 4 MG tablet, Take 1 tablet (4 mg total) by mouth daily with breakfast., Disp: 90 tablet,  Rfl: 3 .  lisinopril (ZESTRIL) 20 MG tablet, Take 1 tablet (20 mg total) by mouth daily., Disp: 90 tablet, Rfl: 3 .  metFORMIN (GLUCOPHAGE) 1000 MG tablet, Take 1 tablet (1,000 mg total) by mouth 2 (two) times daily with a meal., Disp: 180 tablet, Rfl: 3 .  Polyethyl Glycol-Propyl Glycol (SYSTANE HYDRATION PF OP), Apply to eye in the morning, at noon, and at bedtime., Disp: , Rfl:  .  pravastatin (PRAVACHOL) 80 MG tablet, Take 1 tablet (80 mg total) by mouth daily., Disp: 90 tablet, Rfl: 3 .  Semaglutide,0.25 or 0.5MG /DOS, (OZEMPIC, 0.25 OR 0.5 MG/DOSE,) 2 MG/1.5ML SOPN, Inject 0.375 mLs (0.5 mg total) into the skin once a week., Disp: 12 mL, Rfl: 3  No Known Allergies  Objective:   BP 119/69   Pulse (!) 101   Temp (!) 97.5 F (36.4 C)   Resp 20   Ht 5\' 7"  (1.702 m)   Wt 169 lb (76.7 kg)   SpO2 97%   BMI 26.47 kg/m    Physical Exam Vitals reviewed.  Constitutional:      General: He is not in acute distress.    Appearance: Normal appearance. He is not ill-appearing, toxic-appearing or diaphoretic.  HENT:     Head: Normocephalic and atraumatic.  Eyes:     General: No scleral icterus.  Right eye: No discharge.        Left eye: No discharge.     Conjunctiva/sclera: Conjunctivae normal.  Cardiovascular:     Rate and Rhythm: Normal rate.  Pulmonary:     Effort: Pulmonary effort is normal. No respiratory distress.  Musculoskeletal:        General: Normal range of motion.     Cervical back: Normal range of motion.  Skin:    General: Skin is warm and dry.     Findings: Rash (across upper back and down right arm) present. Rash is vesicular.  Neurological:     Mental Status: He is alert and oriented to person, place, and time. Mental status is at baseline.  Psychiatric:        Mood and Affect: Mood normal.        Behavior: Behavior normal.        Thought Content: Thought content normal.        Judgment: Judgment normal.

## 2020-10-15 NOTE — Patient Instructions (Signed)
Schedule your COVID-19 booster shot and your diabetic eye exam.    Shingles  Shingles is an infection. It gives you a painful skin rash and blisters that have fluid in them. Shingles is caused by the same germ (virus) that causes chickenpox. Shingles only happens in people who:  Have had chickenpox.  Have been given a shot of medicine (vaccine) to protect against chickenpox. Shingles is rare in this group. The first symptoms of shingles may be itching, tingling, or pain in an area on your skin. A rash will show on your skin a few days or weeks later. The rash is likely to be on one side of your body. The rash usually has a shape like a belt or a band. Over time, the rash turns into fluid-filled blisters. The blisters will break open, change into scabs, and dry up. Medicines may:  Help with pain and itching.  Help you get better sooner.  Help to prevent long-term problems. Follow these instructions at home: Medicines  Take over-the-counter and prescription medicines only as told by your doctor.  Put on an anti-itch cream or numbing cream where you have a rash, blisters, or scabs. Do this as told by your doctor. Helping with itching and discomfort  Put cold, wet cloths (cold compresses) on the area of the rash or blisters as told by your doctor.  Cool baths can help you feel better. Try adding baking soda or dry oatmeal to the water to lessen itching. Do not bathe in hot water.   Blister and rash care  Keep your rash covered with a loose bandage (dressing).  Wear loose clothing that does not rub on your rash.  Keep your rash and blisters clean. To do this, wash the area with mild soap and cool water as told by your doctor.  Check your rash every day for signs of infection. Check for: ? More redness, swelling, or pain. ? Fluid or blood. ? Warmth. ? Pus or a bad smell.  Do not scratch your rash. Do not pick at your blisters. To help you to not scratch: ? Keep your fingernails  clean and cut short. ? Wear gloves or mittens when you sleep, if scratching is a problem. General instructions  Rest as told by your doctor.  Keep all follow-up visits as told by your doctor. This is important.  Wash your hands often with soap and water. If soap and water are not available, use hand sanitizer. Doing this lowers your chance of getting a skin infection caused by germs (bacteria).  Your infection can cause chickenpox in people who have never had chickenpox or never got a shot of chickenpox vaccine. If you have blisters that did not change into scabs yet, try not to touch other people or be around other people, especially: ? Babies. ? Pregnant women. ? Children who have areas of red, itchy, or rough skin (eczema). ? Very old people who have transplants. ? People who have a long-term (chronic) sickness, like cancer or AIDS. Contact a doctor if:  Your pain does not get better with medicine.  Your pain does not get better after the rash heals.  You have any signs of infection in the rash area. These signs include: ? More redness, swelling, or pain around the rash. ? Fluid or blood coming from the rash. ? The rash area feeling warm to the touch. ? Pus or a bad smell coming from the rash. Get help right away if:  The rash is on  your face or nose.  You have pain in your face or pain by your eye.  You lose feeling on one side of your face.  You have trouble seeing.  You have ear pain, or you have ringing in your ear.  You have a loss of taste.  Your condition gets worse. Summary  Shingles gives you a painful skin rash and blisters that have fluid in them.  Shingles is an infection. It is caused by the same germ (virus) that causes chickenpox.  Keep your rash covered with a loose bandage (dressing). Wear loose clothing that does not rub on your rash.  If you have blisters that did not change into scabs yet, try not to touch other people or be around  people. This information is not intended to replace advice given to you by your health care provider. Make sure you discuss any questions you have with your health care provider. Document Revised: 01/12/2019 Document Reviewed: 05/25/2017 Elsevier Patient Education  2021 [employer].

## 2020-10-16 ENCOUNTER — Ambulatory Visit: Payer: Medicare HMO | Admitting: Family Medicine

## 2020-12-04 ENCOUNTER — Ambulatory Visit: Payer: Medicare HMO | Admitting: Family Medicine

## 2020-12-16 ENCOUNTER — Ambulatory Visit (INDEPENDENT_AMBULATORY_CARE_PROVIDER_SITE_OTHER): Payer: Medicare HMO | Admitting: *Deleted

## 2020-12-16 VITALS — BP 119/69 | Ht 67.0 in | Wt 169.0 lb

## 2020-12-16 DIAGNOSIS — Z0000 Encounter for general adult medical examination without abnormal findings: Secondary | ICD-10-CM | POA: Diagnosis not present
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
## 2020-12-16 NOTE — Patient Instructions (Signed)
  George [name] , Thank you for taking time to come for your Medicare Wellness Visit. I appreciate your ongoing commitment to your health goals. Please review the following plan we discussed and let me know if I can assist you in the future.   These are the goals we discussed: Goals    . Exercise 150 min/wk Moderate Activity    . Have 3 meals a day    . Patient/family would like to work with [facility] to manage chronic medical conditions with primary focus on blood sugar control for now.     Current Barriers:  . Chronic Disease Management support and education needs related to diabetes, hypertension, and hyperlipidemia  Nurse Case Manager Clinical Goal(s):  Marland Kitchen Over the next 30 days, patient will meet with RN Care Manager to address goals for management of diabetes and other chronic medical conditions.   Interventions:  . Spoke with granddaughter, [name], by telephone.  Marland Kitchen RNCM will prepare some educational materials regarding diabetes management . RNCM will mail card with CCM service information   Patient Self Care Activities:  . Performs ADL's independently  Initial goal documentation        This is a list of the screening recommended for you and due dates:  Health Maintenance  Topic Date Due  . COVID-19 Vaccine (3 - Booster for Moderna series) 06/15/2020  . Eye exam for diabetics  10/09/2020  . Complete foot exam   03/06/2021  . Hemoglobin A1C  03/06/2021  . Colon Cancer Screening  08/21/2023  . Tetanus Vaccine  05/31/2025  . Flu Shot  Completed  .  Hepatitis C: One time screening is recommended by Center for Disease Control  (CDC) for  adults born from [birth year] through 1966.   Completed  . Pneumonia vaccines  Completed  . HPV Vaccine  Aged Out

## 2020-12-23 DIAGNOSIS — H40013 Open angle with borderline findings, low risk, bilateral: Secondary | ICD-10-CM | POA: Diagnosis not present

## 2020-12-23 DIAGNOSIS — H2513 Age-related nuclear cataract, bilateral: Secondary | ICD-10-CM | POA: Diagnosis not present

## 2020-12-23 DIAGNOSIS — H524 Presbyopia: Secondary | ICD-10-CM | POA: Diagnosis not present

## 2020-12-23 LAB — HM DIABETES EYE EXAM

## 2021-01-01 ENCOUNTER — Ambulatory Visit (INDEPENDENT_AMBULATORY_CARE_PROVIDER_SITE_OTHER): Payer: Medicare HMO | Admitting: Family Medicine

## 2021-01-01 ENCOUNTER — Encounter: Payer: Self-pay | Admitting: Family Medicine

## 2021-01-01 ENCOUNTER — Other Ambulatory Visit: Payer: Self-pay

## 2021-01-01 VITALS — BP 126/76 | HR 81 | Ht 67.0 in | Wt 169.0 lb

## 2021-01-01 DIAGNOSIS — E782 Mixed hyperlipidemia: Secondary | ICD-10-CM

## 2021-01-01 DIAGNOSIS — I152 Hypertension secondary to endocrine disorders: Secondary | ICD-10-CM | POA: Diagnosis not present

## 2021-01-01 DIAGNOSIS — E1169 Type 2 diabetes mellitus with other specified complication: Secondary | ICD-10-CM

## 2021-01-01 DIAGNOSIS — E1159 Type 2 diabetes mellitus with other circulatory complications: Secondary | ICD-10-CM

## 2021-01-01 DIAGNOSIS — E785 Hyperlipidemia, unspecified: Secondary | ICD-10-CM

## 2021-01-01 DIAGNOSIS — R413 Other amnesia: Secondary | ICD-10-CM | POA: Diagnosis not present

## 2021-01-01 DIAGNOSIS — E119 Type 2 diabetes mellitus without complications: Secondary | ICD-10-CM

## 2021-01-01 LAB — BAYER DCA HB A1C WAIVED: HB A1C (BAYER DCA - WAIVED): 8.7 % — ABNORMAL HIGH (ref <7.0)

## 2021-01-01 MED ORDER — DONEPEZIL HCL 5 MG PO TABS: 5.0000 mg | ORAL_TABLET | Freq: Every day | ORAL | 3 refills | Status: DC

## 2021-01-01 MED ORDER — OZEMPIC (1 MG/DOSE) 2 MG/1.5ML ~~LOC~~ SOPN: 1.0000 mg | PEN_INJECTOR | SUBCUTANEOUS | 3 refills | Status: DC

## 2021-01-01 NOTE — Progress Notes (Signed)
BP 126/76   Pulse 81   Ht _0  (1.702 m)   Wt 169 lb (76.7 kg)   SpO2 97%   BMI 26.47 kg/m    Subjective:   Patient ID: George [name], male    DOB: 1946-02-14, 75 y.o.   MRN: [id number]  HPI: George [name] is a 75 y.o. male presenting on 01/01/2021 for Medical Management of Chronic Issues, Diabetes, and Hyperlipidemia   HPI Type 2 diabetes mellitus Patient comes in today for recheck of his diabetes. Patient has been currently taking glimepiride and Metformin and Ozempic. Patient is currently on an ACE inhibitor/ARB. Patient has not seen an ophthalmologist this year. Patient denies any issues with their feet. The symptom started onset as an adult hypertension and hyperlipidemia ARE RELATED TO DM   Hyperlipidemia Patient is coming in for recheck of his hyperlipidemia. The patient is currently taking pravastatin. They deny any issues with myalgias or history of liver damage from it. They deny any focal numbness or weakness or chest pain.   Hypertension Patient is currently on lisinopril, and their blood pressure today is 126/76. Patient denies any lightheadedness or dizziness. Patient denies headaches, blurred vision, chest pains, shortness of breath, or weakness. Denies any side effects from medication and is content with current medication.   Per wife his memory has continued to gradually worsen he is forgetting more things and she thinks he is forgetting doses of his medicine he would not let her Put them in a pillbox we recommend that he go ahead and let her do that and he is agreeable today via we can take this pelvic you probably get secondary  Relevant past medical, surgical, family and social history reviewed and updated as indicated. Interim medical history since our last visit reviewed. Allergies and medications reviewed and updated.  Review of Systems  Constitutional: Negative for chills and fever.  Respiratory: Negative for shortness of breath and wheezing.    Cardiovascular: Negative for chest pain and leg swelling.  Musculoskeletal: Negative for back pain and gait problem.  Skin: Negative for rash.  Psychiatric/Behavioral: Positive for confusion.  All other systems reviewed and are negative.   Per HPI unless specifically indicated above   Allergies as of 01/01/2021   No Known Allergies     Medication List       Accurate as of January 01, 2021 11:27 AM. If you have any questions, ask your nurse or doctor.        aspirin 81 MG tablet Take 81 mg by mouth daily.   calcium carbonate 600 MG Tabs tablet Commonly known as: OS-CAL Take 600 mg by mouth daily with breakfast.   CINNAMON PO Take 1,000 mg by mouth daily.   fish oil-omega-3 fatty acids 1000 MG capsule Take 2 g by mouth daily.   glimepiride 4 MG tablet Commonly known as: AMARYL Take 1 tablet (4 mg total) by mouth daily with breakfast.   lisinopril 20 MG tablet Commonly known as: ZESTRIL Take 1 tablet (20 mg total) by mouth daily.   metFORMIN 1000 MG tablet Commonly known as: GLUCOPHAGE Take 1 tablet (1,000 mg total) by mouth 2 (two) times daily with a meal.   Ozempic (0.25 or 0.5 MG/DOSE) 2 MG/1.5ML Sopn Generic drug: Semaglutide(0.25 or 0.5MG/DOS) Inject 0.375 mLs (0.5 mg total) into the skin once a week.   pravastatin 80 MG tablet Commonly known as: PRAVACHOL Take 1 tablet (80 mg total) by mouth daily.   SYSTANE HYDRATION PF OP Apply  to eye in the morning, at noon, and at bedtime.        Objective:   BP 126/76   Pulse 81   Ht _0  (1.702 m)   Wt 169 lb (76.7 kg)   SpO2 97%   BMI 26.47 kg/m   Wt Readings from Last 3 Encounters:  01/01/21 169 lb (76.7 kg)  12/16/20 169 lb (76.7 kg)  10/15/20 169 lb (76.7 kg)    Physical Exam Vitals and nursing note reviewed.  Constitutional:      General: He is not in acute distress.    Appearance: He is well-developed. He is not diaphoretic.  Eyes:     General: No scleral icterus.       Right eye: No  discharge.     Conjunctiva/sclera: Conjunctivae normal.     Pupils: Pupils are equal, round, and reactive to light.  Neck:     Thyroid: No thyromegaly.  Cardiovascular:     Rate and Rhythm: Normal rate and regular rhythm.     Heart sounds: Normal heart sounds. No murmur heard.   Pulmonary:     Effort: Pulmonary effort is normal. No respiratory distress.     Breath sounds: Normal breath sounds. No wheezing.  Musculoskeletal:        General: Normal range of motion.     Cervical back: Neck supple.  Lymphadenopathy:     Cervical: No cervical adenopathy.  Skin:    General: Skin is warm and dry.     Findings: No rash.  Neurological:     Mental Status: He is alert and oriented to person, place, and time.     Coordination: Coordination normal.  Psychiatric:        Behavior: Behavior normal.        Thought Content: Thought content does not include suicidal ideation. Thought content does not include suicidal plan.        Cognition and Memory: Memory is impaired.     Results for orders placed or performed in visit on 12/24/20  HM DIABETES EYE EXAM  Result Value Ref Range   HM Diabetic Eye Exam No Retinopathy No Retinopathy    Assessment & Plan:   Problem List Items Addressed This Visit      Cardiovascular and Mediastinum   Hypertension associated with diabetes ([place])   Relevant Medications   Semaglutide, 1 MG/DOSE, (OZEMPIC, 1 MG/DOSE,) 2 MG/1.5ML SOPN   Other Relevant Orders   CBC with Differential/Platelet   CMP14+EGFR   Lipid panel   Bayer DCA Hb A1c Waived     Endocrine   Type 2 diabetes mellitus not at goal [facility])   Relevant Medications   Semaglutide, 1 MG/DOSE, (OZEMPIC, 1 MG/DOSE,) 2 MG/1.5ML SOPN   Mixed hyperlipidemia due to type 2 diabetes mellitus (HCC)   Relevant Medications   Semaglutide, 1 MG/DOSE, (OZEMPIC, 1 MG/DOSE,) 2 MG/1.5ML SOPN   Other Relevant Orders   CBC with Differential/Platelet   CMP14+EGFR   Lipid panel   Bayer DCA Hb A1c Waived   Type 2  diabetes mellitus with hyperlipidemia (HCC)   Relevant Medications   Semaglutide, 1 MG/DOSE, (OZEMPIC, 1 MG/DOSE,) 2 MG/1.5ML SOPN    Other Visit Diagnoses    Type 2 diabetes mellitus without complication, without long-term current use of insulin (HCC)    -  Primary   Relevant Medications   Semaglutide, 1 MG/DOSE, (OZEMPIC, 1 MG/DOSE,) 2 MG/1.5ML SOPN   Other Relevant Orders   CBC with Differential/Platelet   CMP14+EGFR  Lipid panel   Bayer DCA Hb A1c Waived   Memory impairment       Relevant Medications   donepezil (ARICEPT) 5 MG tablet      A1c is up, I think that he is missing doses and his wife thinks third 2, will increase the Ozempic but recommended that they start using a pill dispenser. Follow up plan: Return in about 3 months (around 04/02/2021), or if symptoms worsen or fail to improve, for Diabetes and cholesterol..  Counseling provided for all of the vaccine components Orders Placed This Encounter  Procedures  . CBC with Differential/Platelet  . CMP14+EGFR  . Lipid panel  . Bayer [facility] Hb A1c [place], MD [facility] 01/01/2021, 11:27 AM

## 2021-01-02 LAB — CBC WITH DIFFERENTIAL/PLATELET
Basophils Absolute: 0.1 10*3/uL (ref 0.0–0.2)
Basos: 1 %
EOS (ABSOLUTE): 0.1 10*3/uL (ref 0.0–0.4)
Eos: 2 %
Hematocrit: 44.4 % (ref 37.5–51.0)
Hemoglobin: 14.8 g/dL (ref 13.0–17.7)
Immature Grans (Abs): 0 10*3/uL (ref 0.0–0.1)
Immature Granulocytes: 0 %
Lymphocytes Absolute: 2.3 10*3/uL (ref 0.7–3.1)
Lymphs: 29 %
MCH: 29 pg (ref 26.6–33.0)
MCHC: 33.3 g/dL (ref 31.5–35.7)
MCV: 87 fL (ref 79–97)
Monocytes Absolute: 0.4 10*3/uL (ref 0.1–0.9)
Monocytes: 6 %
Neutrophils Absolute: 5 10*3/uL (ref 1.4–7.0)
Neutrophils: 62 %
Platelets: 232 10*3/uL (ref 150–450)
RBC: 5.1 x10E6/uL (ref 4.14–5.80)
RDW: 13.3 % (ref 11.6–15.4)
WBC: 7.8 10*3/uL (ref 3.4–10.8)

## 2021-01-02 LAB — LIPID PANEL
Chol/HDL Ratio: 4.3 ratio (ref 0.0–5.0)
Cholesterol, Total: 223 mg/dL — ABNORMAL HIGH (ref 100–199)
HDL: 52 mg/dL (ref >39)
LDL Chol Calc (NIH): 147 mg/dL — ABNORMAL HIGH (ref 0–99)
Triglycerides: 136 mg/dL (ref 0–149)
VLDL Cholesterol Cal: 24 mg/dL (ref 5–40)

## 2021-01-02 LAB — CMP14+EGFR
ALT: 54 IU/L — ABNORMAL HIGH (ref 0–44)
AST: 24 IU/L (ref 0–40)
Albumin/Globulin Ratio: 1.7 (ref 1.2–2.2)
Albumin: 4.5 g/dL (ref 3.7–4.7)
Alkaline Phosphatase: 71 IU/L (ref 44–121)
BUN/Creatinine Ratio: 17 (ref 10–24)
BUN: 19 mg/dL (ref 8–27)
Bilirubin Total: 0.3 mg/dL (ref 0.0–1.2)
CO2: 24 mmol/L (ref 20–29)
Calcium: 11 mg/dL — ABNORMAL HIGH (ref 8.6–10.2)
Chloride: 101 mmol/L (ref 96–106)
Creatinine, Ser: 1.11 mg/dL (ref 0.76–1.27)
Globulin, Total: 2.6 g/dL (ref 1.5–4.5)
Glucose: 189 mg/dL — ABNORMAL HIGH (ref 65–99)
Potassium: 5.2 mmol/L (ref 3.5–5.2)
Sodium: 143 mmol/L (ref 134–144)
Total Protein: 7.1 g/dL (ref 6.0–8.5)
eGFR: 69 mL/min/{1.73_m2} (ref >59)

## 2021-01-14 DIAGNOSIS — H2511 Age-related nuclear cataract, right eye: Secondary | ICD-10-CM | POA: Diagnosis not present

## 2021-01-14 DIAGNOSIS — H25041 Posterior subcapsular polar age-related cataract, right eye: Secondary | ICD-10-CM | POA: Diagnosis not present

## 2021-01-14 DIAGNOSIS — H25811 Combined forms of age-related cataract, right eye: Secondary | ICD-10-CM | POA: Diagnosis not present

## 2021-04-03 ENCOUNTER — Ambulatory Visit (INDEPENDENT_AMBULATORY_CARE_PROVIDER_SITE_OTHER): Payer: Medicare HMO | Admitting: Family Medicine

## 2021-04-03 ENCOUNTER — Other Ambulatory Visit: Payer: Self-pay

## 2021-04-03 ENCOUNTER — Encounter: Payer: Self-pay | Admitting: Family Medicine

## 2021-04-03 VITALS — BP 118/67 | HR 62 | Ht 67.0 in | Wt 167.0 lb

## 2021-04-03 DIAGNOSIS — E1169 Type 2 diabetes mellitus with other specified complication: Secondary | ICD-10-CM | POA: Diagnosis not present

## 2021-04-03 DIAGNOSIS — E1159 Type 2 diabetes mellitus with other circulatory complications: Secondary | ICD-10-CM

## 2021-04-03 DIAGNOSIS — E119 Type 2 diabetes mellitus without complications: Secondary | ICD-10-CM | POA: Diagnosis not present

## 2021-04-03 DIAGNOSIS — I152 Hypertension secondary to endocrine disorders: Secondary | ICD-10-CM

## 2021-04-03 DIAGNOSIS — E785 Hyperlipidemia, unspecified: Secondary | ICD-10-CM | POA: Diagnosis not present

## 2021-04-03 DIAGNOSIS — E782 Mixed hyperlipidemia: Secondary | ICD-10-CM

## 2021-04-03 LAB — BAYER DCA HB A1C WAIVED: HB A1C (BAYER DCA - WAIVED): 8.8 % — ABNORMAL HIGH (ref <7.0)

## 2021-04-03 MED ORDER — GLIMEPIRIDE 4 MG PO TABS: 4.0000 mg | ORAL_TABLET | Freq: Every day | ORAL | 3 refills | Status: DC

## 2021-04-03 MED ORDER — PRAVASTATIN SODIUM 80 MG PO TABS: 80.0000 mg | ORAL_TABLET | Freq: Every day | ORAL | 3 refills | Status: DC

## 2021-04-03 MED ORDER — METFORMIN HCL 1000 MG PO TABS: 1000.0000 mg | ORAL_TABLET | Freq: Two times a day (BID) | ORAL | 3 refills | Status: DC

## 2021-04-03 MED ORDER — LISINOPRIL 20 MG PO TABS: 20.0000 mg | ORAL_TABLET | Freq: Every day | ORAL | 3 refills | Status: DC

## 2021-04-03 NOTE — Progress Notes (Signed)
BP 118/67   Pulse 62   Ht 5\' 7"  (1.702 m)   Wt 167 lb (75.8 kg)   SpO2 96%   BMI 26.16 kg/m    Subjective:   Patient ID: George [name], male    DOB: Sep 01, 1946, 75 y.o.   MRN: [id number]  HPI: George [name] is a 75 y.o. male presenting on 04/03/2021 for Medical Management of Chronic Issues, Diabetes, and Hyperlipidemia   HPI Type 2 diabetes mellitus Patient comes in today for recheck of his diabetes. Patient has been currently taking glimiperide and metformin and Ozempic but he has been out of Ozempic for couple months.. Patient is currently on an ACE inhibitor/ARB. Patient has not seen an ophthalmologist this year. Patient denies any issues with their feet. The symptom started onset as an adult hyperlipidemia and hypertension ARE RELATED TO DM   Hyperlipidemia Patient is coming in for recheck of his hyperlipidemia. The patient is currently taking fish oil and pravastatin. They deny any issues with myalgias or history of liver damage from it. They deny any focal numbness or weakness or chest pain.   Hypertension Patient is currently on lisinopril, and their blood pressure today is 118/67. Patient denies any lightheadedness or dizziness. Patient denies headaches, blurred vision, chest pains, shortness of breath, or weakness. Denies any side effects from medication and is content with current medication.   Relevant past medical, surgical, family and social history reviewed and updated as indicated. Interim medical history since our last visit reviewed. Allergies and medications reviewed and updated.  Review of Systems  Constitutional:  Negative for chills and fever.  Eyes:  Negative for visual disturbance.  Respiratory:  Negative for shortness of breath and wheezing.   Cardiovascular:  Negative for chest pain and leg swelling.  Musculoskeletal:  Negative for back pain and gait problem.  Skin:  Negative for rash.  Neurological:  Negative for dizziness, weakness and  light-headedness.  All other systems reviewed and are negative.  Per HPI unless specifically indicated above   Allergies as of 04/03/2021   No Known Allergies      Medication List        Accurate as of April 03, 2021  1:07 PM. If you have any questions, ask your nurse or doctor.          aspirin 81 MG tablet Take 81 mg by mouth daily.   calcium carbonate 600 MG Tabs tablet Commonly known as: OS-CAL Take 600 mg by mouth daily with breakfast.   CINNAMON PO Take 1,000 mg by mouth daily.   donepezil 5 MG tablet Commonly known as: ARICEPT Take 1 tablet (5 mg total) by mouth at bedtime.   fish oil-omega-3 fatty acids 1000 MG capsule Take 2 g by mouth daily.   glimepiride 4 MG tablet Commonly known as: AMARYL Take 1 tablet (4 mg total) by mouth daily with breakfast.   lisinopril 20 MG tablet Commonly known as: ZESTRIL Take 1 tablet (20 mg total) by mouth daily.   metFORMIN 1000 MG tablet Commonly known as: GLUCOPHAGE Take 1 tablet (1,000 mg total) by mouth 2 (two) times daily with a meal.   Ozempic (1 MG/DOSE) 2 MG/1.5ML Sopn Generic drug: Semaglutide (1 MG/DOSE) Inject 1 mg into the skin once a week.   pravastatin 80 MG tablet Commonly known as: PRAVACHOL Take 1 tablet (80 mg total) by mouth daily.   SYSTANE HYDRATION PF OP Apply to eye in the morning, at noon, and at bedtime.  Objective:   BP 118/67   Pulse 62   Ht 5\' 7"  (1.702 m)   Wt 167 lb (75.8 kg)   SpO2 96%   BMI 26.16 kg/m   Wt Readings from Last 3 Encounters:  04/03/21 167 lb (75.8 kg)  01/01/21 169 lb (76.7 kg)  12/16/20 169 lb (76.7 kg)    Physical Exam Vitals and nursing note reviewed.  Constitutional:      General: He is not in acute distress.    Appearance: He is well-developed. He is not diaphoretic.  Eyes:     General: No scleral icterus.    Conjunctiva/sclera: Conjunctivae normal.  Neck:     Thyroid: No thyromegaly.  Cardiovascular:     Rate and Rhythm: Normal  rate and regular rhythm.     Heart sounds: Normal heart sounds. No murmur heard. Pulmonary:     Effort: Pulmonary effort is normal. No respiratory distress.     Breath sounds: Normal breath sounds. No wheezing.  Musculoskeletal:        General: Normal range of motion.     Cervical back: Neck supple.  Lymphadenopathy:     Cervical: No cervical adenopathy.  Skin:    General: Skin is warm and dry.     Findings: No rash.  Neurological:     Mental Status: He is alert and oriented to person, place, and time.     Coordination: Coordination normal.  Psychiatric:        Behavior: Behavior normal.      Assessment & Plan:   Problem List Items Addressed This Visit       Cardiovascular and Mediastinum   Hypertension associated with diabetes (HCC)   Relevant Medications   glimepiride (AMARYL) 4 MG tablet   lisinopril (ZESTRIL) 20 MG tablet   metFORMIN (GLUCOPHAGE) 1000 MG tablet   pravastatin (PRAVACHOL) 80 MG tablet     Endocrine   Type 2 diabetes mellitus not at goal [facility])   Relevant Medications   glimepiride (AMARYL) 4 MG tablet   lisinopril (ZESTRIL) 20 MG tablet   metFORMIN (GLUCOPHAGE) 1000 MG tablet   pravastatin (PRAVACHOL) 80 MG tablet   Mixed hyperlipidemia due to type 2 diabetes mellitus (HCC)   Relevant Medications   glimepiride (AMARYL) 4 MG tablet   lisinopril (ZESTRIL) 20 MG tablet   metFORMIN (GLUCOPHAGE) 1000 MG tablet   pravastatin (PRAVACHOL) 80 MG tablet   Type 2 diabetes mellitus with hyperlipidemia (HCC)   Relevant Medications   glimepiride (AMARYL) 4 MG tablet   lisinopril (ZESTRIL) 20 MG tablet   metFORMIN (GLUCOPHAGE) 1000 MG tablet   pravastatin (PRAVACHOL) 80 MG tablet   Other Visit Diagnoses     Type 2 diabetes mellitus without complication, without long-term current use of insulin (HCC)    -  Primary   Relevant Medications   glimepiride (AMARYL) 4 MG tablet   lisinopril (ZESTRIL) 20 MG tablet   metFORMIN (GLUCOPHAGE) 1000 MG tablet    pravastatin (PRAVACHOL) 80 MG tablet   Other Relevant Orders   Bayer DCA Hb A1c Waived       Patient's A1c is up but he has been out of his Ozempic completely over the past month and a half.  We gave him a sample of Ozempic and we will get him set up again for prescription assistance.  Recheck in 3 months Follow up plan: Return in about 3 months (around 07/04/2021), or if symptoms worsen or fail to improve, for Diabetes.  Counseling provided for all of  the vaccine components Orders Placed This Encounter  Procedures   Bayer DCA Hb A1c Waived    Arville Care, MD [facility] 04/03/2021, 1:07 PM

## 2021-04-09 ENCOUNTER — Ambulatory Visit (INDEPENDENT_AMBULATORY_CARE_PROVIDER_SITE_OTHER): Payer: Medicare HMO | Admitting: Pharmacist

## 2021-04-09 DIAGNOSIS — E119 Type 2 diabetes mellitus without complications: Secondary | ICD-10-CM

## 2021-04-09 NOTE — Progress Notes (Signed)
Chronic Care Management Pharmacy Note  04/09/2021 Name:  George [name] MRN:  [id number] DOB:  [birth year]  Summary: DIABETES MANAGEMENT   Recommendations/Changes made from today's visit: Diabetes: Uncontrolled; current treatment:OZEMPIC 1MG SQ WEEKLY (ran out due to cost); METFORMIN, GLIMEPIRIDE  Denies personal and family history of Medullary thyroid cancer (MTC) A1C HAS INCREASED WITHOUT OZEMPIC THERAPY WILL LOOK TO D/C GLIMEPIRIDE WHEN OZEMPIC SUPPLY ARRIVES GFR 69 Current glucose readings: fasting glucose: NOT CHECKING, post prandial glucose: N/A Denies hypoglycemic/hyperglycemic symptoms Current exercise: N/A Educated on OZEMPIC, DIET, MEDICATIONS Assessed patient finances. APPLICATION SUBMITTED FOR OZEMPIC PATIENT ASSISTANCE VIA [employer] PATIENT ASSISTANCE PROGRAM  Plan:  Subjective: SEPHIROTH MCLUCKIE is an 75 y.o. year old male who is a primary patient of Dettinger, Fransisca Kaufmann, MD.  The CCM team was consulted for assistance with disease management and care coordination needs.    Engaged with patient face to face for initial visit in response to provider referral for pharmacy case management and/or care coordination services.   Consent to Services:  The patient was given the following information about Chronic Care Management services today, agreed to services, and gave verbal consent: 1. CCM service includes personalized support from designated clinical staff supervised by the primary care provider, including individualized plan of care and coordination with other care providers 2. 24/7 contact phone numbers for assistance for urgent and routine care needs. 3. Service will only be billed when office clinical staff spend 20 minutes or more in a month to coordinate care. 4. Only one practitioner may furnish and bill the service in a calendar month. 5.The patient may stop CCM services at any time (effective at the end of the month) by phone call to the office staff. 6. The  patient will be responsible for cost sharing (co-pay) of up to 20% of the service fee (after annual deductible is met). Patient agreed to services and consent obtained.  Patient Care Team: Dettinger, Fransisca Kaufmann, MD as PCP - General (Family Medicine) Ilean China, RN as Registered Nurse Lavera Guise, [facility] (Pharmacist)  Recent office visits: 04/03/21  Recent consult visits: [facility] visits: None in previous 6 months  Objective:  Lab Results  Component Value Date   CREATININE 1.11 01/01/2021   CREATININE 1.14 06/06/2020   CREATININE 1.14 03/06/2020    Lab Results  Component Value Date   HGBA1C 8.8 (H) 04/03/2021   Last diabetic Eye exam:  Lab Results  Component Value Date/Time   HMDIABEYEEXA No Retinopathy 12/23/2020 12:00 AM    Last diabetic Foot exam: No results found for: HMDIABFOOTEX      Component Value Date/Time   CHOL 223 (H) 01/01/2021 1103   CHOL 198 03/14/2013 1039   TRIG 136 01/01/2021 1103   TRIG 113 03/14/2013 1039   HDL 52 01/01/2021 1103   HDL 45 03/14/2013 1039   CHOLHDL 4.3 01/01/2021 1103   LDLCALC 147 (H) 01/01/2021 1103   LDLCALC 130 (H) 03/14/2013 1039    Hepatic Function Latest Ref Rng & Units 01/01/2021 06/06/2020 12/04/2019  Total Protein 6.0 - 8.5 g/dL 7.1 6.6 6.6  Albumin 3.7 - 4.7 g/dL 4.5 4.5 4.3  AST 0 - 40 IU/L _0 ALT 0 - 44 IU/L 54(H) 22 21  Alk Phosphatase 44 - 121 IU/L 71 51 60  Total Bilirubin 0.0 - 1.2 mg/dL 0.3 0.3 0.3    Lab Results  Component Value Date/Time   TSH 0.804 09/14/2013 10:00 AM    CBC Latest  Ref Rng & Units 01/01/2021 06/06/2020 12/04/2019  WBC 3.4 - 10.8 x10E3/uL 7.8 7.9 6.5  Hemoglobin 13.0 - 17.7 g/dL 14.8 12.7(L) 14.0  Hematocrit 37.5 - 51.0 % 44.4 38.4 41.6  Platelets 150 - 450 x10E3/uL 232 252 226    Lab Results  Component Value Date/Time   VD25OH 45.5 09/14/2013 10:00 AM   VD25OH 50 03/14/2013 11:40 AM    Clinical ASCVD: No  The 10-year ASCVD risk score Mikey Bussing DC Jr., et al., 2013) is:  45.6%   Values used to calculate the score:     Age: 75 years     Sex: Male     Is Non-Hispanic African American: No     Diabetic: Yes     Tobacco smoker: No     Systolic Blood Pressure: 673 mmHg     Is BP treated: Yes     HDL Cholesterol: 52 mg/dL     Total Cholesterol: 223 mg/dL    Other: (CHADS2VASc if Afib, PHQ9 if depression, MMRC or CAT for COPD, ACT, DEXA)  Social History   Tobacco Use  Smoking Status Former   Packs/day: 1.00   Years: 15.00   Pack years: 15.00   Types: Cigarettes   Start date: 12/27/1965   Quit date: 06/16/1983   Years since quitting: 37.8  Smokeless Tobacco Former   Types: Chew  Tobacco Comments   Former smoker   BP Readings from Last 3 Encounters:  04/03/21 118/67  01/01/21 126/76  12/16/20 119/69   Pulse Readings from Last 3 Encounters:  04/03/21 62  01/01/21 81  10/15/20 (!) 101   Wt Readings from Last 3 Encounters:  04/03/21 167 lb (75.8 kg)  01/01/21 169 lb (76.7 kg)  12/16/20 169 lb (76.7 kg)    Assessment: Review of patient past medical history, allergies, medications, health status, including review of consultants reports, laboratory and other test data, was performed as part of comprehensive evaluation and provision of chronic care management services.   SDOH:  (Social Determinants of Health) assessments and interventions performed:    CCM Care Plan  No Known Allergies  Medications Reviewed Today     Reviewed by Lavera Guise, [facility] (Pharmacist) on 04/10/21 at 1044  Med List Status: <None>   Medication Order Taking? Sig Documenting Provider Last Dose Status Informant  aspirin 81 MG tablet [id number] No Take 81 mg by mouth daily. [provider] Taking Active Multiple Informants  calcium carbonate (OS-CAL) 600 MG TABS [id number] No Take 600 mg by mouth daily with breakfast.  [provider] Taking Active Multiple Informants  CINNAMON PO [id number] No Take 1,000 mg by mouth daily.  [provider] Taking  Active Multiple Informants  donepezil (ARICEPT) 5 MG tablet [id number] No Take 1 tablet (5 mg total) by mouth at bedtime. Dettinger, Fransisca Kaufmann, MD Taking Active   fish oil-omega-3 fatty acids 1000 MG capsule [id number] No Take 2 g by mouth daily. [provider] Taking Active Multiple Informants  glimepiride (AMARYL) 4 MG tablet [id number]  Take 1 tablet (4 mg total) by mouth daily with breakfast. Dettinger, Fransisca Kaufmann, MD  Active   lisinopril (ZESTRIL) 20 MG tablet [id number]  Take 1 tablet (20 mg total) by mouth daily. Dettinger, Fransisca Kaufmann, MD  Active   metFORMIN (GLUCOPHAGE) 1000 MG tablet [id number]  Take 1 tablet (1,000 mg total) by mouth 2 (two) times daily with a meal. Dettinger, Fransisca Kaufmann, MD  Active   Polyethyl Glycol-Propyl Glycol (SYSTANE HYDRATION PF OP) [id number] No Apply  to eye in the morning, at noon, and at bedtime. [provider] Taking Active   pravastatin (PRAVACHOL) 80 MG tablet [id number]  Take 1 tablet (80 mg total) by mouth daily. Dettinger, Fransisca Kaufmann, MD  Active   Semaglutide, 1 MG/DOSE, (OZEMPIC, 1 MG/DOSE,) 2 MG/1.5ML SOPN [id number] No Inject 1 mg into the skin once a week. Dettinger, Fransisca Kaufmann, MD Taking Active             Patient Active Problem List   Diagnosis Date Noted   Type 2 diabetes mellitus with hyperlipidemia ([place])    Diabetic retinopathy ([place]) 03/25/2017   Type 2 diabetes mellitus not at goal [facility]) 06/17/2014   Mixed hyperlipidemia due to type 2 diabetes mellitus ([place]) 06/17/2014   Hypertension associated with diabetes ([place]) 06/17/2014   Overweight (BMI 25.0-29.9) 06/17/2014    Immunization History  Administered Date(s) Administered   Fluad Quad(high Dose 65+) 09/05/2020   Influenza, High Dose Seasonal PF 06/06/2014, 06/15/2015, 08/28/2018   Influenza,inj,Quad PF,6+ Mos 08/13/2016, 07/08/2017   Moderna Sars-Covid-2 Vaccination 11/15/2019, 12/14/2019   Pneumococcal Conjugate-13 07/15/2014   Pneumococcal Polysaccharide-23 07/10/2010   Tdap  06/01/2015    Conditions to be addressed/monitored: HTN, HLD, and DMII  Care Plan : PHARMD MEDICATION  MANAGEMENT  Updates made by Lavera Guise, [place] since 04/10/2021 12:00 AM     Problem: DISEASE PROGRESSION PREVENTION      Long-Range Goal: T2DM   This Visit's Progress: Not on track  Priority: High  Note:   Current Barriers:  Unable to independently afford treatment regimen Unable to maintain control of T2DM   Pharmacist Clinical Goal(s):  Over the next 90 days, patient will verbalize ability to afford treatment regimen maintain control of T2DM as evidenced by GOAL A1C<7%, IMPROVED GLYCEMIC CONTROL  through collaboration with PharmD and provider.    Interventions: 1:1 collaboration with Dettinger, Fransisca Kaufmann, MD regarding development and update of comprehensive plan of care as evidenced by provider attestation and co-signature Inter-disciplinary care team collaboration (see longitudinal plan of care) Comprehensive medication review performed; medication list updated in electronic medical record  Diabetes: Uncontrolled; current treatment:OZEMPIC 1MG SQ WEEKLY (ran out due to cost); METFORMIN, GLIMEPIRIDE  Denies personal and family history of Medullary thyroid cancer (MTC) A1C HAS INCREASED WITHOUT OZEMPIC THERAPY WILL LOOK TO D/C GLIMEPIRIDE WHEN OZEMPIC SUPPLY ARRIVES GFR 69 Current glucose readings: fasting glucose: NOT CHECKING, post prandial glucose: N/A Denies hypoglycemic/hyperglycemic symptoms Discussed meal planning options and Plate method for healthy eating Avoid sugary drinks and desserts Incorporate balanced protein, non starchy veggies, 1 serving of carbohydrate with each meal Increase water intake Increase physical activity as able Current exercise: N/A Educated on OZEMPIC, DIET, MEDICATIONS Assessed patient finances. APPLICATION SUBMITTED FOR OZEMPIC PATIENT ASSISTANCE VIA [place] PATIENT ASSISTANCE PROGRAM   Patient Goals/Self-Care Activities Over  the next 90 days, patient will:  - take medications as prescribed check glucose 3 times weekly, document, and provide at future appointments  Follow Up Plan: Telephone follow up appointment with care management team member scheduled for: 1 month      Medication Assistance: Application for [employer]OVON[employer]ISK/OZEMPIC  medication assistance program. in process.  Anticipated assistance start date TBD.  See plan of care for additional detail.  Patient's preferred pharmacy is:  [id number] [id number]: [phone]: [phone] WIFE ASSISTS WITH MEDICATIONS  Follow Up:  Patient agrees to Care Plan and Follow-up.  Plan: Telephone follow up appointment with  care management team member scheduled for:  3 WEEKS  Regina Eck, PharmD, BCPS Clinical Pharmacist, [place]  II Phone [phone]

## 2021-04-10 ENCOUNTER — Ambulatory Visit: Payer: Self-pay

## 2021-04-10 NOTE — Patient Instructions (Signed)
Visit Information  PATIENT GOALS:  Goals Addressed               This Visit's Progress     Patient Stated     T2DM (pt-stated)        Current Barriers:  Unable to independently afford treatment regimen Unable to maintain control of T2DM   Pharmacist Clinical Goal(s):  Over the next 90 days, patient will verbalize ability to afford treatment regimen maintain control of T2DM as evidenced by GOAL A1C<7%, IMPROVED GLYCEMIC CONTROL  through collaboration with PharmD and provider.    Interventions: 1:1 collaboration with Dettinger, Elige Radon, MD regarding development and update of comprehensive plan of care as evidenced by provider attestation and co-signature Inter-disciplinary care team collaboration (see longitudinal plan of care) Comprehensive medication review performed; medication list updated in electronic medical record  Diabetes: Uncontrolled; current treatment:OZEMPIC 1MG  SQ WEEKLY (ran out due to cost); METFORMIN, GLIMEPIRIDE  Denies personal and family history of Medullary thyroid cancer (MTC) A1C HAS INCREASED WITHOUT OZEMPIC THERAPY WILL LOOK TO D/C GLIMEPIRIDE WHEN OZEMPIC SUPPLY ARRIVES GFR 69 Current glucose readings: fasting glucose: NOT CHECKING, post prandial glucose: N/A Denies hypoglycemic/hyperglycemic symptoms Discussed meal planning options and Plate method for healthy eating Avoid sugary drinks and desserts Incorporate balanced protein, non starchy veggies, 1 serving of carbohydrate with each meal Increase water intake Increase physical activity as able Current exercise: N/A Educated on OZEMPIC, DIET, MEDICATIONS Assessed patient finances. APPLICATION SUBMITTED FOR OZEMPIC PATIENT ASSISTANCE VIA [employer] PATIENT ASSISTANCE PROGRAM   Patient Goals/Self-Care Activities Over the next 90 days, patient will:  - take medications as prescribed check glucose 3 times weekly, document, and provide at future appointments  Follow Up Plan: Telephone follow  up appointment with care management team member scheduled for: 1 month          The patient verbalized understanding of instructions, educational materials, and care plan provided today and declined offer to receive copy of patient instructions, educational materials, and care plan.   Telephone follow up appointment with care management team member scheduled for: 3 WEEKS  Signature , PharmD, BCPS Clinical Pharmacist, [facility]  II Phone [phone]

## 2021-04-15 NOTE — Progress Notes (Signed)
Received notification from [employer] regarding approval for [facility]  Patient assistance approved from 04/13/21 to 09/02/21.  Medication will ship a 3-4 month supply to office.  Phone: [phone]

## 2021-05-01 ENCOUNTER — Other Ambulatory Visit: Payer: Self-pay | Admitting: Family Medicine

## 2021-05-01 ENCOUNTER — Ambulatory Visit: Payer: Self-pay | Admitting: Pharmacist

## 2021-05-01 ENCOUNTER — Telehealth: Payer: Self-pay | Admitting: Family Medicine

## 2021-05-01 DIAGNOSIS — E119 Type 2 diabetes mellitus without complications: Secondary | ICD-10-CM

## 2021-05-01 MED ORDER — ONETOUCH VERIO FLEX SYSTEM W/DEVICE KIT: PACK | 0 refills | Status: DC

## 2021-05-01 MED ORDER — ONETOUCH VERIO VI STRP: ORAL_STRIP | 12 refills | Status: DC

## 2021-05-01 MED ORDER — ONETOUCH DELICA LANCETS 30G MISC: 11 refills | Status: DC

## 2021-05-01 NOTE — Progress Notes (Signed)
Chronic Care Management Pharmacy Note  05/01/2021 Name:  George [name] MRN:  [id number] DOB:  09-22-1946  Summary: DIABETES MANAGEMENT   Recommendations/Changes made from today's visit: Diabetes: Uncontrolled; current treatment:OZEMPIC 1MG  SQ WEEKLY (ran out due to cost); METFORMIN, GLIMEPIRIDE  Denies personal and family history of Medullary thyroid cancer (MTC) A1C HAS INCREASED WITHOUT OZEMPIC THERAPY WILL LOOK TO D/C GLIMEPIRIDE WHEN OZEMPIC SUPPLY ARRIVES GFR 69, A1C 8.8% 05/01/21--complaints of n/v, may be with ozempic patient's wife is unsure He is out of ozempic this week (last shot last Friday) Samples of tradjenta given to bridge gap to ozempic-->may have to switch ozempic to another agent or decrease dose if not tolerated at 1mg  Appt scheduled with patient & wife next week Assessed patient finances. PATIENT APPROVED FOR OZEMPIC PATIENT ASSISTANCE VIA [employer] PATIENT ASSISTANCE PROGRAM-SUPPLY WILL SHIP IN 10-14 BUSINESS DAYS TO PCP OFFICE, PATIENT AWARE  Follow Up Plan: Telephone follow up appointment with care management team member scheduled for: 1 month   Subjective: George [name] is an 75 y.o. year old male who is a primary patient of Dettinger, Fransisca Kaufmann, MD.  The CCM team was consulted for assistance with disease management and care coordination needs.    Engaged with patient by telephone for follow up visit in response to provider referral for pharmacy case management and/or care coordination services.   Consent to Services:  The patient was given information about Chronic Care Management services, agreed to services, and gave verbal consent prior to initiation of services.  Please see initial visit note for detailed documentation.   Patient Care Team: Dettinger, Fransisca Kaufmann, MD as PCP - General (Family Medicine) Ilean China, RN as Registered Nurse Lavera Guise, [facility] (Pharmacist)  Objective:  Lab Results  Component Value Date   CREATININE 1.11  01/01/2021   CREATININE 1.14 06/06/2020   CREATININE 1.14 03/06/2020    Lab Results  Component Value Date   HGBA1C 8.8 (H) 04/03/2021   Last diabetic Eye exam:  Lab Results  Component Value Date/Time   HMDIABEYEEXA No Retinopathy 12/23/2020 12:00 AM    Last diabetic Foot exam: No results found for: HMDIABFOOTEX      Component Value Date/Time   CHOL 223 (H) 01/01/2021 1103   CHOL 198 03/14/2013 1039   TRIG 136 01/01/2021 1103   TRIG 113 03/14/2013 1039   HDL 52 01/01/2021 1103   HDL 45 03/14/2013 1039   CHOLHDL 4.3 01/01/2021 1103   LDLCALC 147 (H) 01/01/2021 1103   LDLCALC 130 (H) 03/14/2013 1039    Hepatic Function Latest Ref Rng & Units 01/01/2021 06/06/2020 12/04/2019  Total Protein 6.0 - 8.5 g/dL 7.1 6.6 6.6  Albumin 3.7 - 4.7 g/dL 4.5 4.5 4.3  AST 0 - 40 IU/L 24 18 17   ALT 0 - 44 IU/L 54(H) 22 21  Alk Phosphatase 44 - 121 IU/L 71 51 60  Total Bilirubin 0.0 - 1.2 mg/dL 0.3 0.3 0.3    Lab Results  Component Value Date/Time   TSH 0.804 09/14/2013 10:00 AM    CBC Latest Ref Rng & Units 01/01/2021 06/06/2020 12/04/2019  WBC 3.4 - 10.8 x10E3/uL 7.8 7.9 6.5  Hemoglobin 13.0 - 17.7 g/dL 14.8 12.7(L) 14.0  Hematocrit 37.5 - 51.0 % 44.4 38.4 41.6  Platelets 150 - 450 x10E3/uL 232 252 226    Lab Results  Component Value Date/Time   VD25OH 45.5 09/14/2013 10:00 AM   VD25OH 50 03/14/2013 11:40 AM    Clinical ASCVD: No  The 10-year ASCVD risk score Mikey Bussing DC Brooke Bonito., et al., 2013) is: 45.6%   Values used to calculate the score:     Age: 75 years     Sex: Male     Is Non-Hispanic African American: No     Diabetic: Yes     Tobacco smoker: No     Systolic Blood Pressure: 357 mmHg     Is BP treated: Yes     HDL Cholesterol: 52 mg/dL     Total Cholesterol: 223 mg/dL    Other: (CHADS2VASc if Afib, PHQ9 if depression, MMRC or CAT for COPD, ACT, DEXA)  Social History   Tobacco Use  Smoking Status Former   Packs/day: 1.00   Years: 15.00   Pack years: 15.00   Types:  Cigarettes   Start date: 12/27/1965   Quit date: 06/16/1983   Years since quitting: 37.9  Smokeless Tobacco Former   Types: Chew  Tobacco Comments   Former smoker   BP Readings from Last 3 Encounters:  04/03/21 118/67  01/01/21 126/76  12/16/20 119/69   Pulse Readings from Last 3 Encounters:  04/03/21 62  01/01/21 81  10/15/20 (!) 101   Wt Readings from Last 3 Encounters:  04/03/21 167 lb (75.8 kg)  01/01/21 169 lb (76.7 kg)  12/16/20 169 lb (76.7 kg)    Assessment: Review of patient past medical history, allergies, medications, health status, including review of consultants reports, laboratory and other test data, was performed as part of comprehensive evaluation and provision of chronic care management services.   SDOH:  (Social Determinants of Health) assessments and interventions performed:    CCM Care Plan  No Known Allergies  Medications Reviewed Today     Reviewed by Lavera Guise, [facility] (Pharmacist) on 05/01/21 at 1100  Med List Status: <None>   Medication Order Taking? Sig Documenting Provider Last Dose Status Informant  aspirin 81 MG tablet [id number] No Take 81 mg by mouth daily. [provider] Taking Active Multiple Informants  calcium carbonate (OS-CAL) 600 MG TABS [id number] No Take 600 mg by mouth daily with breakfast.  [provider] Taking Active Multiple Informants  CINNAMON PO [id number] No Take 1,000 mg by mouth daily.  [provider] Taking Active Multiple Informants  donepezil (ARICEPT) 5 MG tablet [id number] No Take 1 tablet (5 mg total) by mouth at bedtime. Dettinger, Fransisca Kaufmann, MD Taking Active   fish oil-omega-3 fatty acids 1000 MG capsule [id number] No Take 2 g by mouth daily. [provider] Taking Active Multiple Informants  glimepiride (AMARYL) 4 MG tablet [id number]  Take 1 tablet (4 mg total) by mouth daily with breakfast. Dettinger, Fransisca Kaufmann, MD  Active   lisinopril (ZESTRIL) 20 MG tablet [id number]  Take 1 tablet  (20 mg total) by mouth daily. Dettinger, Fransisca Kaufmann, MD  Active   metFORMIN (GLUCOPHAGE) 1000 MG tablet [id number]  Take 1 tablet (1,000 mg total) by mouth 2 (two) times daily with a meal. Dettinger, Fransisca Kaufmann, MD  Active   Polyethyl Glycol-Propyl Glycol (SYSTANE HYDRATION PF OP) [id number] No Apply to eye in the morning, at noon, and at bedtime. [provider] Taking Active   pravastatin (PRAVACHOL) 80 MG tablet [id number]  Take 1 tablet (80 mg total) by mouth daily. Dettinger, Fransisca Kaufmann, MD  Active   Semaglutide, 1 MG/DOSE, (OZEMPIC, 1 MG/DOSE,) 2 MG/1.5ML SOPN [id number] No Inject 1 mg into the skin once a week. Dettinger, Fransisca Kaufmann, MD Taking Active  Patient Active Problem List   Diagnosis Date Noted   Type 2 diabetes mellitus with hyperlipidemia ([place])    Diabetic retinopathy ([place]) 03/25/2017   Type 2 diabetes mellitus not at goal [facility]) 06/17/2014   Mixed hyperlipidemia due to type 2 diabetes mellitus ([place]) 06/17/2014   Hypertension associated with diabetes ([place]) 06/17/2014   Overweight (BMI 25.0-29.9) 06/17/2014    Immunization History  Administered Date(s) Administered   Fluad Quad(high Dose 65+) 09/05/2020   Influenza, High Dose Seasonal PF 06/06/2014, 06/15/2015, 08/28/2018   Influenza,inj,Quad PF,6+ Mos 08/13/2016, 07/08/2017   Moderna Sars-Covid-2 Vaccination 11/15/2019, 12/14/2019   Pneumococcal Conjugate-13 07/15/2014   Pneumococcal Polysaccharide-23 07/10/2010   Tdap 06/01/2015    Conditions to be addressed/monitored: DMII  Care Plan : PHARMD MEDICATION  MANAGEMENT  Updates made by Lavera Guise, [place] since 05/01/2021 12:00 AM     Problem: DISEASE PROGRESSION PREVENTION      Long-Range Goal: T2DM   Recent Progress: Not on track  Priority: High  Note:   Current Barriers:  Unable to independently afford treatment regimen Unable to maintain control of T2DM   Pharmacist Clinical Goal(s):  Over the next 90 days, patient will verbalize ability  to afford treatment regimen maintain control of T2DM as evidenced by GOAL A1C<7%, IMPROVED GLYCEMIC CONTROL  through collaboration with PharmD and provider.    Interventions: 1:1 collaboration with Dettinger, Fransisca Kaufmann, MD regarding development and update of comprehensive plan of care as evidenced by provider attestation and co-signature Inter-disciplinary care team collaboration (see longitudinal plan of care) Comprehensive medication review performed; medication list updated in electronic medical record  Diabetes: Uncontrolled; current treatment:OZEMPIC 1MG  SQ WEEKLY (ran out due to cost); METFORMIN, GLIMEPIRIDE  Denies personal and family history of Medullary thyroid cancer (MTC) A1C HAS INCREASED WITHOUT OZEMPIC THERAPY WILL LOOK TO D/C GLIMEPIRIDE WHEN OZEMPIC SUPPLY ARRIVES GFR 69, A1C 8.8 05/01/21--complaints of n/v, may be with ozempic patient's wife is unsure He is out of ozempic this week (last shot last Friday) Samples of tradjenta given to bridge gap to ozempic-->may have to switch ozempic to another agent or decrease dose if not tolerated at 1mg  Appt scheduled with patient & wife next week Current glucose readings: fasting glucose: NOT CHECKING, post prandial glucose: N/A Denies hypoglycemic/hyperglycemic symptoms Discussed meal planning options and Plate method for healthy eating Avoid sugary drinks and desserts Incorporate balanced protein, non starchy veggies, 1 serving of carbohydrate with each meal Increase water intake Increase physical activity as able Current exercise: N/A Educated on OZEMPIC, DIET, MEDICATIONS Assessed patient finances. PATIENT APPROVED FOR OZEMPIC PATIENT ASSISTANCE VIA [employer] PATIENT ASSISTANCE PROGRAM-SUPPLY WILL SHIP IN 10-14 BUSINESS DAYS TO PCP OFFICE, PATIENT AWARE   Patient Goals/Self-Care Activities Over the next 90 days, patient will:  - take medications as prescribed check glucose 3 times weekly, document, and provide at future  appointments  Follow Up Plan: Telephone follow up appointment with care management team member scheduled for: 1 month      Medication Assistance:  OZEMPIC obtained through [employer] medication assistance program.  Enrollment ends 10/03/21  Patient's [facility] pharmacy [facility]:  [id number] [place], [place] [address] Phone: [phone] Fax: [phone]  Uses pill box? No - wife assists with medications Wife endorses 100% compliance  Follow Up:  Patient agrees to Care Plan and Follow-up.  Plan: Telephone follow up appointment with care management team member scheduled for:  next week  Regina Eck, PharmD, BCPS Clinical Pharmacist,  [place]  II Phone [phone]

## 2021-05-01 NOTE — Patient Instructions (Signed)
Visit Information  PATIENT GOALS:  Goals Addressed               This Visit's Progress     Patient Stated     T2DM (pt-stated)        Current Barriers:  Unable to independently afford treatment regimen Unable to maintain control of T2DM   Pharmacist Clinical Goal(s):  Over the next 90 days, patient will verbalize ability to afford treatment regimen maintain control of T2DM as evidenced by GOAL A1C<7%, IMPROVED GLYCEMIC CONTROL  through collaboration with PharmD and provider.    Interventions: 1:1 collaboration with Dettinger, Elige Radon, MD regarding development and update of comprehensive plan of care as evidenced by provider attestation and co-signature Inter-disciplinary care team collaboration (see longitudinal plan of care) Comprehensive medication review performed; medication list updated in electronic medical record  Diabetes: Uncontrolled; current treatment:OZEMPIC 1MG  SQ WEEKLY (ran out due to cost); METFORMIN, GLIMEPIRIDE  Denies personal and family history of Medullary thyroid cancer (MTC) A1C HAS INCREASED WITHOUT OZEMPIC THERAPY WILL LOOK TO D/C GLIMEPIRIDE WHEN OZEMPIC SUPPLY ARRIVES GFR 69, A1C 8.8 05/01/21--complaints of n/v, may be with ozempic patient's wife is unsure He is out of ozempic this week (last shot last Friday) Samples of tradjenta given to bridge gap to ozempic-->may have to switch ozempic to another agent or decrease dose if not tolerated at 1mg  Appt scheduled with patient & wife next week Current glucose readings: fasting glucose: NOT CHECKING, post prandial glucose: N/A Denies hypoglycemic/hyperglycemic symptoms Discussed meal planning options and Plate method for healthy eating Avoid sugary drinks and desserts Incorporate balanced protein, non starchy veggies, 1 serving of carbohydrate with each meal Increase water intake Increase physical activity as able Current exercise: N/A Educated on OZEMPIC, DIET, MEDICATIONS Assessed patient  finances. PATIENT APPROVED FOR OZEMPIC PATIENT ASSISTANCE VIA [employer] PATIENT ASSISTANCE PROGRAM-SUPPLY WILL SHIP IN 10-14 BUSINESS DAYS TO PCP OFFICE, PATIENT AWARE   Patient Goals/Self-Care Activities Over the next 90 days, patient will:  - take medications as prescribed check glucose 3 times weekly, document, and provide at future appointments  Follow Up Plan: Telephone follow up appointment with care management team member scheduled for: 1 month         The patient verbalized understanding of instructions, educational materials, and care plan provided today and declined offer to receive copy of patient instructions, educational materials, and care plan.   Telephone follow up appointment with care management team member scheduled for: next week  Signature , PharmD, BCPS Clinical Pharmacist, [facility] Family Medicine [facility]  II Phone [phone]

## 2021-05-04 ENCOUNTER — Telehealth: Payer: Self-pay | Admitting: *Deleted

## 2021-05-04 NOTE — Telephone Encounter (Signed)
#  4 Ozempic pens here ready for pick up  In fridge with name on it.  Pt wife aware - will try to pick up 8/2.  Pt assistance.

## 2021-05-05 ENCOUNTER — Telehealth: Payer: Self-pay

## 2021-05-05 NOTE — Telephone Encounter (Signed)
(  [place])  Your information has been sent to [facility]

## 2021-05-06 NOTE — Telephone Encounter (Signed)
Approvedtoday PA Case: [id number], Status: Approved, Coverage Starts on: 10/04/2020 12:00:00 AM, Coverage Ends on: 10/03/2021 12:00:00 AM. Questions? Contact [phone]. Drug Marketing executive w/Device kit Form [employer] Electronic PA Form Original Claim Info [id number] PA REQD CALL [phone] ACCU-CHEK OR TRUE METRIX  [facility] [place] made aware.

## 2021-05-06 NOTE — Telephone Encounter (Signed)
Call returned.

## 2021-05-30 ENCOUNTER — Other Ambulatory Visit: Payer: Self-pay | Admitting: Family Medicine

## 2021-07-06 ENCOUNTER — Other Ambulatory Visit: Payer: Self-pay

## 2021-07-06 ENCOUNTER — Ambulatory Visit (INDEPENDENT_AMBULATORY_CARE_PROVIDER_SITE_OTHER): Payer: Medicare HMO | Admitting: Family Medicine

## 2021-07-06 ENCOUNTER — Encounter: Payer: Self-pay | Admitting: Family Medicine

## 2021-07-06 VITALS — BP 121/72 | HR 71 | Ht 67.0 in | Wt 169.0 lb

## 2021-07-06 DIAGNOSIS — E782 Mixed hyperlipidemia: Secondary | ICD-10-CM | POA: Diagnosis not present

## 2021-07-06 DIAGNOSIS — E785 Hyperlipidemia, unspecified: Secondary | ICD-10-CM

## 2021-07-06 DIAGNOSIS — Z23 Encounter for immunization: Secondary | ICD-10-CM

## 2021-07-06 DIAGNOSIS — R413 Other amnesia: Secondary | ICD-10-CM | POA: Insufficient documentation

## 2021-07-06 DIAGNOSIS — E119 Type 2 diabetes mellitus without complications: Secondary | ICD-10-CM | POA: Diagnosis not present

## 2021-07-06 DIAGNOSIS — F0280 Dementia in other diseases classified elsewhere without behavioral disturbance: Secondary | ICD-10-CM | POA: Insufficient documentation

## 2021-07-06 DIAGNOSIS — I152 Hypertension secondary to endocrine disorders: Secondary | ICD-10-CM | POA: Diagnosis not present

## 2021-07-06 DIAGNOSIS — E1169 Type 2 diabetes mellitus with other specified complication: Secondary | ICD-10-CM | POA: Diagnosis not present

## 2021-07-06 DIAGNOSIS — E1159 Type 2 diabetes mellitus with other circulatory complications: Secondary | ICD-10-CM

## 2021-07-06 DIAGNOSIS — Z125 Encounter for screening for malignant neoplasm of prostate: Secondary | ICD-10-CM | POA: Diagnosis not present

## 2021-07-06 LAB — BAYER DCA HB A1C WAIVED: HB A1C (BAYER DCA - WAIVED): 6 % — ABNORMAL HIGH (ref 4.8–5.6)

## 2021-07-06 NOTE — Progress Notes (Signed)
BP 121/72   Pulse 71   Ht _0  (1.702 m)   Wt 169 lb (76.7 kg)   SpO2 98%   BMI 26.47 kg/m    Subjective:   Patient ID: George [name], male    DOB: 02-Apr-1946, 75 y.o.   MRN: [id number]  HPI: George [name] is a 75 y.o. male presenting on 07/06/2021 for Medical Management of Chronic Issues and Diabetes   HPI Type 2 diabetes mellitus Patient comes in today for recheck of his diabetes. Patient has been currently taking glimepiride and metformin and Ozempic and A1c 6.0 today. Patient is currently on an ACE inhibitor/ARB. Patient has seen an ophthalmologist this year. Patient denies any issues with their feet. The symptom started onset as an adult hypertension and hyperlipidemia ARE RELATED TO DM   Hypertension Patient is currently on lisinopril, and their blood pressure today is 121/72. Patient denies any lightheadedness or dizziness. Patient denies headaches, blurred vision, chest pains, shortness of breath, or weakness. Denies any side effects from medication and is content with current medication.   Hyperlipidemia Patient is coming in for recheck of his hyperlipidemia. The patient is currently taking pravastatin and fish oil. They deny any issues with myalgias or history of liver damage from it. They deny any focal numbness or weakness or chest pain.   Relevant past medical, surgical, family and social history reviewed and updated as indicated. Interim medical history since our last visit reviewed. Allergies and medications reviewed and updated.  Review of Systems  Constitutional:  Negative for chills and fever.  Eyes:  Negative for visual disturbance.  Respiratory:  Negative for shortness of breath and wheezing.   Cardiovascular:  Negative for chest pain and leg swelling.  Musculoskeletal:  Negative for back pain and gait problem.  Skin:  Negative for rash.  Neurological:  Negative for dizziness, weakness and light-headedness.  All other systems reviewed and are  negative.  Per HPI unless specifically indicated above   Allergies as of 07/06/2021   No Known Allergies      Medication List        Accurate as of July 06, 2021  9:36 AM. If you have any questions, ask your nurse or doctor.          STOP taking these medications    donepezil 5 MG tablet Commonly known as: ARICEPT Stopped by: Fransisca Kaufmann Brandyce Dimario, MD       TAKE these medications    Accu-Chek Guide Me w/Device Kit USE TO TEST BLOOD SUGAR DAILY AS DIRECTED. DX E11.65   Accu-Chek Guide test strip Generic drug: glucose blood test blood sugar once daily. Dx: E11.65   Accu-Chek Softclix Lancets lancets test blood sugar once daily. Dx: E11.65   aspirin 81 MG tablet Take 81 mg by mouth daily.   calcium carbonate 600 MG Tabs tablet Commonly known as: OS-CAL Take 600 mg by mouth daily with breakfast.   CINNAMON PO Take 1,000 mg by mouth daily.   fish oil-omega-3 fatty acids 1000 MG capsule Take 2 g by mouth daily.   glimepiride 4 MG tablet Commonly known as: AMARYL Take 1 tablet (4 mg total) by mouth daily with breakfast.   lisinopril 20 MG tablet Commonly known as: ZESTRIL Take 1 tablet (20 mg total) by mouth daily.   metFORMIN 1000 MG tablet Commonly known as: GLUCOPHAGE Take 1 tablet (1,000 mg total) by mouth 2 (two) times daily with a meal.   Ozempic (1 MG/DOSE) 2 MG/1.5ML Sopn Generic  drug: Semaglutide (1 MG/DOSE) Inject 1 mg into the skin once a week.   pravastatin 80 MG tablet Commonly known as: PRAVACHOL Take 1 tablet (80 mg total) by mouth daily.   SYSTANE HYDRATION PF OP Apply to eye in the morning, at noon, and at bedtime.         Objective:   BP 121/72   Pulse 71   Ht _0  (1.702 m)   Wt 169 lb (76.7 kg)   SpO2 98%   BMI 26.47 kg/m   Wt Readings from Last 3 Encounters:  07/06/21 169 lb (76.7 kg)  04/03/21 167 lb (75.8 kg)  01/01/21 169 lb (76.7 kg)    Physical Exam Vitals and nursing note reviewed.  Constitutional:       General: He is not in acute distress.    Appearance: He is well-developed. He is not diaphoretic.  Eyes:     General: No scleral icterus.    Conjunctiva/sclera: Conjunctivae normal.  Neck:     Thyroid: No thyromegaly.  Cardiovascular:     Rate and Rhythm: Normal rate and regular rhythm.     Heart sounds: Normal heart sounds. No murmur heard. Pulmonary:     Effort: Pulmonary effort is normal. No respiratory distress.     Breath sounds: Normal breath sounds. No wheezing.  Musculoskeletal:        General: No swelling. Normal range of motion.     Cervical back: Neck supple.  Lymphadenopathy:     Cervical: No cervical adenopathy.  Skin:    General: Skin is warm and dry.     Findings: No rash.  Neurological:     Mental Status: He is alert and oriented to person, place, and time.     Coordination: Coordination normal.  Psychiatric:        Behavior: Behavior normal.      Assessment & Plan:   Problem List Items Addressed This Visit       Cardiovascular and Mediastinum   Hypertension associated with diabetes ([place])   Relevant Orders   CBC with Differential/Platelet   CMP14+EGFR   Lipid panel   PSA, total and free   Bayer DCA Hb A1c Waived     Endocrine   Type 2 diabetes mellitus not at goal [facility])   Mixed hyperlipidemia due to type 2 diabetes mellitus ([place])   Relevant Orders   CBC with Differential/Platelet   CMP14+EGFR   Lipid panel   PSA, total and free   Bayer DCA Hb A1c Waived   Type 2 diabetes mellitus with hyperlipidemia (H[facility])   Other Visit Diagnoses     Type 2 diabetes mellitus without complication, without long-term current use of insulin (HCC)    -  Primary   Relevant Orders   CBC with Differential/Platelet   CMP14+EGFR   Lipid panel   PSA, total and free   Bayer DCA Hb A1c Waived     A1c looks good at 6.0 blood pressure looks good, continue current medicine.  No change medication, continue current.  Follow up plan: Return in about 3 months  (around 10/06/2021), or if symptoms worsen or fail to improve, for Diabetes hypertension and cholesterol.  Counseling provided for all of the vaccine components Orders Placed This Encounter  Procedures   CBC with Differential/Platelet   CMP14+EGFR   Lipid panel   PSA, total and free   Bayer DCA Hb A1c Waived    Caryl Pina, MD [facility] 07/06/2021, 9:36 AM

## 2021-07-07 LAB — CBC WITH DIFFERENTIAL/PLATELET
Basophils Absolute: 0 10*3/uL (ref 0.0–0.2)
Basos: 1 %
EOS (ABSOLUTE): 0.1 10*3/uL (ref 0.0–0.4)
Eos: 1 %
Hematocrit: 39.2 % (ref 37.5–51.0)
Hemoglobin: 13 g/dL (ref 13.0–17.7)
Immature Grans (Abs): 0 10*3/uL (ref 0.0–0.1)
Immature Granulocytes: 0 %
Lymphocytes Absolute: 2.2 10*3/uL (ref 0.7–3.1)
Lymphs: 32 %
MCH: 29 pg (ref 26.6–33.0)
MCHC: 33.2 g/dL (ref 31.5–35.7)
MCV: 88 fL (ref 79–97)
Monocytes Absolute: 0.4 10*3/uL (ref 0.1–0.9)
Monocytes: 6 %
Neutrophils Absolute: 4 10*3/uL (ref 1.4–7.0)
Neutrophils: 60 %
Platelets: 236 10*3/uL (ref 150–450)
RBC: 4.48 x10E6/uL (ref 4.14–5.80)
RDW: 13.3 % (ref 11.6–15.4)
WBC: 6.7 10*3/uL (ref 3.4–10.8)

## 2021-07-07 LAB — PSA, TOTAL AND FREE
PSA, Free Pct: 40.7 %
PSA, Free: 1.71 ng/mL
Prostate Specific Ag, Serum: 4.2 ng/mL — ABNORMAL HIGH (ref 0.0–4.0)

## 2021-07-07 LAB — CMP14+EGFR
ALT: 24 IU/L (ref 0–44)
AST: 20 IU/L (ref 0–40)
Albumin/Globulin Ratio: 2 (ref 1.2–2.2)
Albumin: 4.3 g/dL (ref 3.7–4.7)
Alkaline Phosphatase: 59 IU/L (ref 44–121)
BUN/Creatinine Ratio: 18 (ref 10–24)
BUN: 21 mg/dL (ref 8–27)
Bilirubin Total: 0.3 mg/dL (ref 0.0–1.2)
CO2: 23 mmol/L (ref 20–29)
Calcium: 9.7 mg/dL (ref 8.6–10.2)
Chloride: 104 mmol/L (ref 96–106)
Creatinine, Ser: 1.18 mg/dL (ref 0.76–1.27)
Globulin, Total: 2.1 g/dL (ref 1.5–4.5)
Glucose: 78 mg/dL (ref 70–99)
Potassium: 4.7 mmol/L (ref 3.5–5.2)
Sodium: 142 mmol/L (ref 134–144)
Total Protein: 6.4 g/dL (ref 6.0–8.5)
eGFR: 64 mL/min/{1.73_m2} (ref >59)

## 2021-07-07 LAB — LIPID PANEL
Chol/HDL Ratio: 3.9 ratio (ref 0.0–5.0)
Cholesterol, Total: 151 mg/dL (ref 100–199)
HDL: 39 mg/dL — ABNORMAL LOW (ref >39)
LDL Chol Calc (NIH): 92 mg/dL (ref 0–99)
Triglycerides: 110 mg/dL (ref 0–149)
VLDL Cholesterol Cal: 20 mg/dL (ref 5–40)

## 2021-09-01 DIAGNOSIS — Z961 Presence of intraocular lens: Secondary | ICD-10-CM | POA: Diagnosis not present

## 2021-09-09 ENCOUNTER — Telehealth: Payer: Self-pay

## 2021-09-09 NOTE — Telephone Encounter (Signed)
Daughter of patient came in with wife of pt today. Daughter stated that she was unaware that pt had ran out of his Ozempic. He has none left and needs to call mail order pharmacy to have them deliver.   One sample of Ozempic 1mg  given

## 2021-09-10 ENCOUNTER — Telehealth: Payer: Self-pay | Admitting: Family Medicine

## 2021-09-10 NOTE — Telephone Encounter (Signed)
VM box not set up

## 2021-09-10 NOTE — Telephone Encounter (Signed)
George [name] will fax form that comes from mail order pharmacy for Ozempic to our office and put it to [name]'s attention.  One sample was given at yesterday's visit. See prior telephone note if needed.

## 2021-09-10 NOTE — Telephone Encounter (Signed)
Please let patient know:  For patient assistance you have to re-apply every year He will need to make CCM appt and bring newest financial docs for household Process takes about 4 weeks (ozempic is also on backorder so there are delays) Patient may have to pay for one fill at the pharmacy at first of year--should be around $47 then we can get re-enrolled  Let patient know my teammate Penne Lash will be calling him to schedule appt

## 2021-09-10 NOTE — Telephone Encounter (Signed)
Please check to see if there are any samples, I do not know that there are but we can check, we will also forward to [place] to see if there is anything she has that we can help him with

## 2021-09-11 ENCOUNTER — Telehealth: Payer: Self-pay

## 2021-09-11 NOTE — Chronic Care Management (AMB) (Signed)
  Care Management   Note  09/11/2021 Name: JACLYN CAREW MRN: [id number] DOB: 03/20/1946  ROBT OKUDA is a 75 y.o. year old male who is a primary care patient of Dettinger, Elige Radon, MD and is actively engaged with the care management team. I reached out to Colvin Caroli by phone today to assist with scheduling a follow up visit with the Pharmacist  Follow up plan: Unsuccessful telephone outreach attempt made. A HIPAA compliant phone message was left for the patient providing contact information and requesting a return call.  The care management team will reach out to the patient again over the next 5 days.  If patient returns call to provider office, please advise to call Embedded Care Management Care Guide Penne Lash  at [phone]  Penne Lash, RMA Care Guide, Embedded Care Coordination [facility]  [place], [place] [address] Direct Dial: [phone] [email or web address]: [email or web address]

## 2021-09-16 ENCOUNTER — Telehealth: Payer: Self-pay | Admitting: Family Medicine

## 2021-09-16 NOTE — Telephone Encounter (Signed)
Spoke with daughter, Lupita Leash. She is aware. Appt scheduled 1/3  If Rx's need to be sent [facility] [place]

## 2021-09-22 NOTE — Telephone Encounter (Signed)
Pt's daughter is aware and said she has appt for her father set up with Raynelle Fanning on 10/06/21 and will bring any papers in at that time.

## 2021-09-22 NOTE — Telephone Encounter (Signed)
I did not receive any faxes for this patient Can they resend?

## 2021-10-06 ENCOUNTER — Ambulatory Visit (INDEPENDENT_AMBULATORY_CARE_PROVIDER_SITE_OTHER): Payer: Medicare HMO | Admitting: Pharmacist

## 2021-10-06 VITALS — BP 130/79

## 2021-10-06 DIAGNOSIS — E1169 Type 2 diabetes mellitus with other specified complication: Secondary | ICD-10-CM

## 2021-10-07 ENCOUNTER — Telehealth: Payer: Self-pay | Admitting: Family Medicine

## 2021-10-08 ENCOUNTER — Ambulatory Visit (INDEPENDENT_AMBULATORY_CARE_PROVIDER_SITE_OTHER): Payer: Medicare HMO | Admitting: Family Medicine

## 2021-10-08 ENCOUNTER — Encounter: Payer: Self-pay | Admitting: Family Medicine

## 2021-10-08 VITALS — BP 117/67 | HR 70 | Ht 67.0 in | Wt 161.0 lb

## 2021-10-08 DIAGNOSIS — I152 Hypertension secondary to endocrine disorders: Secondary | ICD-10-CM | POA: Diagnosis not present

## 2021-10-08 DIAGNOSIS — E1159 Type 2 diabetes mellitus with other circulatory complications: Secondary | ICD-10-CM

## 2021-10-08 DIAGNOSIS — E1169 Type 2 diabetes mellitus with other specified complication: Secondary | ICD-10-CM | POA: Diagnosis not present

## 2021-10-08 DIAGNOSIS — E782 Mixed hyperlipidemia: Secondary | ICD-10-CM

## 2021-10-08 DIAGNOSIS — E119 Type 2 diabetes mellitus without complications: Secondary | ICD-10-CM

## 2021-10-08 DIAGNOSIS — Z23 Encounter for immunization: Secondary | ICD-10-CM | POA: Diagnosis not present

## 2021-10-08 DIAGNOSIS — E11319 Type 2 diabetes mellitus with unspecified diabetic retinopathy without macular edema: Secondary | ICD-10-CM

## 2021-10-08 DIAGNOSIS — E785 Hyperlipidemia, unspecified: Secondary | ICD-10-CM

## 2021-10-08 LAB — BAYER DCA HB A1C WAIVED: HB A1C (BAYER DCA - WAIVED): 7 % — ABNORMAL HIGH (ref 4.8–5.6)

## 2021-10-08 NOTE — Progress Notes (Signed)
BP 117/67    Pulse 70    Ht _0  (1.702 m)    Wt 161 lb (73 kg)    SpO2 98%    BMI 25.22 kg/m    Subjective:   Patient ID: George [name], male    DOB: 11/02/1945, 76 y.o.   MRN: [id number]  HPI: George [name] is a 76 y.o. male presenting on 10/08/2021 for Medical Management of Chronic Issues, Diabetes, Hyperlipidemia, and Hypertension   HPI Type 2 diabetes mellitus Patient comes in today for recheck of his diabetes. Patient has been currently taking metformin and glimepiride and Ozempic. Patient is currently on an ACE inhibitor/ARB. Patient has seen an ophthalmologist this year. Patient denies any issues with their feet. The symptom started onset as an adult hypertension and hyperlipidemia and retinopathy ARE RELATED TO DM   Hypertension Patient is currently on lisinopril, and their blood pressure today is 117/67. Patient denies any lightheadedness or dizziness. Patient denies headaches, blurred vision, chest pains, shortness of breath, or weakness. Denies any side effects from medication and is content with current medication.   Hyperlipidemia Patient is coming in for recheck of his hyperlipidemia. The patient is currently taking pravastatin. They deny any issues with myalgias or history of liver damage from it. They deny any focal numbness or weakness or chest pain.   Relevant past medical, surgical, family and social history reviewed and updated as indicated. Interim medical history since our last visit reviewed. Allergies and medications reviewed and updated.  Review of Systems  Constitutional:  Negative for chills and fever.  Eyes:  Negative for visual disturbance.  Respiratory:  Negative for shortness of breath and wheezing.   Cardiovascular:  Negative for chest pain and leg swelling.  Musculoskeletal:  Negative for back pain and gait problem.  Skin:  Negative for rash.  Neurological:  Negative for dizziness, weakness and light-headedness.  All other systems reviewed and  are negative.  Per HPI unless specifically indicated above   Allergies as of 10/08/2021   No Known Allergies      Medication List        Accurate as of October 08, 2021  9:22 AM. If you have any questions, ask your nurse or doctor.          Accu-Chek Guide Me w/Device Kit USE TO TEST BLOOD SUGAR DAILY AS DIRECTED. DX E11.65   Accu-Chek Guide test strip Generic drug: glucose blood test blood sugar once daily. Dx: E11.65   Accu-Chek Softclix Lancets lancets test blood sugar once daily. Dx: E11.65   aspirin 81 MG tablet Take 81 mg by mouth daily.   calcium carbonate 600 MG Tabs tablet Commonly known as: OS-CAL Take 600 mg by mouth daily with breakfast.   CINNAMON PO Take 1,000 mg by mouth daily.   fish oil-omega-3 fatty acids 1000 MG capsule Take 2 g by mouth daily.   glimepiride 4 MG tablet Commonly known as: AMARYL Take 1 tablet (4 mg total) by mouth daily with breakfast.   lisinopril 20 MG tablet Commonly known as: ZESTRIL Take 1 tablet (20 mg total) by mouth daily.   metFORMIN 1000 MG tablet Commonly known as: GLUCOPHAGE Take 1 tablet (1,000 mg total) by mouth 2 (two) times daily with a meal.   Ozempic (1 MG/DOSE) 2 MG/1.5ML Sopn Generic drug: Semaglutide (1 MG/DOSE) Inject 1 mg into the skin once a week.   pravastatin 80 MG tablet Commonly known as: PRAVACHOL Take 1 tablet (80 mg total) by mouth  daily.   SYSTANE HYDRATION PF OP Apply to eye in the morning, at noon, and at bedtime.         Objective:   BP 117/67    Pulse 70    Ht _0  (1.702 m)    Wt 161 lb (73 kg)    SpO2 98%    BMI 25.22 kg/m   Wt Readings from Last 3 Encounters:  10/08/21 161 lb (73 kg)  07/06/21 169 lb (76.7 kg)  04/03/21 167 lb (75.8 kg)    Physical Exam Vitals and nursing note reviewed.  Constitutional:      General: He is not in acute distress.    Appearance: He is well-developed. He is not diaphoretic.  Eyes:     General: No scleral icterus.     Conjunctiva/sclera: Conjunctivae normal.  Neck:     Thyroid: No thyromegaly.  Cardiovascular:     Rate and Rhythm: Normal rate and regular rhythm.     Heart sounds: Normal heart sounds. No murmur heard. Pulmonary:     Effort: Pulmonary effort is normal. No respiratory distress.     Breath sounds: Normal breath sounds. No wheezing.  Musculoskeletal:        General: No swelling. Normal range of motion.     Cervical back: Neck supple.  Lymphadenopathy:     Cervical: No cervical adenopathy.  Skin:    General: Skin is warm and dry.     Findings: No rash.  Neurological:     Mental Status: He is alert and oriented to person, place, and time.     Coordination: Coordination normal.  Psychiatric:        Behavior: Behavior normal.      Assessment & Plan:   Problem List Items Addressed This Visit       Cardiovascular and Mediastinum   Hypertension associated with diabetes ([place])     Endocrine   Type 2 diabetes mellitus not at goal [facility])   Mixed hyperlipidemia due to type 2 diabetes mellitus ([place])   Diabetic retinopathy ([place])   Type 2 diabetes mellitus with hyperlipidemia ([place])   Other Visit Diagnoses     Type 2 diabetes mellitus without complication, without long-term current use of insulin ([place])    -  Primary   Relevant Orders   Bayer DCA Hb A1c Waived   Need for shingles vaccine       Relevant Orders   Varicella-zoster vaccine IM (Shingrix)     A1c is 7.0, rate at goal, focus on diet, no medication changes.  Likely slightly elevated from the holidays.    Follow up plan: Return in about 3 months (around 01/06/2022), or if symptoms worsen or fail to improve, for Diabetes and hypertension and cholesterol.  Counseling provided for all of the vaccine components Orders Placed This Encounter  Procedures   Varicella-zoster vaccine IM (Shingrix)   Bayer DCA Hb A1c [place], MD [facility] 10/08/2021, 9:22 AM

## 2021-10-11 NOTE — Progress Notes (Signed)
Chronic Care Management Pharmacy Note  10/06/2021 Name:  George [name] MRN:  [id number] DOB:  December 21, 1945  Summary: t2dm, hld  Recommendations/Changes made from today's visit:  Diabetes: Controlled-a1c 7%; current treatment: OZEMPIC 1MG SQ WEEKLY; METFORMIN,- gfr 64 GLIMEPIRIDE  Denies personal and family history of Medullary thyroid cancer (MTC) A1C HAS INCREASED WITHOUT OZEMPIC THERAPY (shipment received this month from patient assistance program) Recommend d/c GLIMEPIRIDE Current glucose readings: fasting glucose: NOT CHECKING, post prandial glucose: N/A Denies hypoglycemic/hyperglycemic symptoms Discussed meal planning options and Plate method for healthy eating Avoid sugary drinks and desserts Incorporate balanced protein, non starchy veggies, 1 serving of carbohydrate with each meal Increase water intake Increase physical activity as able Current exercise: N/A Educated on OZEMPIC, DIET, MEDICATIONS Assessed patient finances. Application submitted for re-enrollment in [place] to PCP office when refill due--just received 3 month supply in December 2022  Patient Goals/Self-Care Activities Over the next 90 days, patient will:  - take medications as prescribed check glucose 3 times weekly, document, and provide at future appointments  Follow Up Plan: Telephone follow up appointment with care management team member scheduled for: 3 months  Subjective: George [name] is an 76 y.o. year old male who is a primary patient of Dettinger, Fransisca Kaufmann, MD.  The CCM team was consulted for assistance with disease management and care coordination needs.    Engaged with patient by telephone for follow up visit in response to provider referral for pharmacy case management and/or care coordination services.   Consent to Services:  The patient was given information about Chronic Care Management services, agreed to services, and gave verbal consent  prior to initiation of services.  Please see initial visit note for detailed documentation.   Patient Care Team: Dettinger, Fransisca Kaufmann, MD as PCP - General (Family Medicine) Ilean China, RN as Registered Nurse Lavera Guise, [facility] (Pharmacist)  Objective:  Lab Results  Component Value Date   CREATININE 1.18 07/06/2021   CREATININE 1.11 01/01/2021   CREATININE 1.14 06/06/2020    Lab Results  Component Value Date   HGBA1C 7.0 (H) 10/08/2021   Last diabetic Eye exam:  Lab Results  Component Value Date/Time   HMDIABEYEEXA No Retinopathy 12/23/2020 12:00 AM    Last diabetic Foot exam: No results found for: HMDIABFOOTEX      Component Value Date/Time   CHOL 151 07/06/2021 0916   CHOL 198 03/14/2013 1039   TRIG 110 07/06/2021 0916   TRIG 113 03/14/2013 1039   HDL 39 (L) 07/06/2021 0916   HDL 45 03/14/2013 1039   CHOLHDL 3.9 07/06/2021 0916   LDLCALC 92 07/06/2021 0916   LDLCALC 130 (H) 03/14/2013 1039    Hepatic Function Latest Ref Rng & Units 07/06/2021 01/01/2021 06/06/2020  Total Protein 6.0 - 8.5 g/dL 6.4 7.1 6.6  Albumin 3.7 - 4.7 g/dL 4.3 4.5 4.5  AST 0 - 40 IU/L _0 ALT 0 - 44 IU/L 24 54(H) 22  Alk Phosphatase 44 - 121 IU/L 59 71 51  Total Bilirubin 0.0 - 1.2 mg/dL 0.3 0.3 0.3    Lab Results  Component Value Date/Time   TSH 0.804 09/14/2013 10:00 AM    CBC Latest Ref Rng & Units 07/06/2021 01/01/2021 06/06/2020  WBC 3.4 - 10.8 x10E3/uL 6.7 7.8 7.9  Hemoglobin 13.0 - 17.7 g/dL 13.0 14.8 12.7(L)  Hematocrit 37.5 - 51.0 % 39.2 44.4 38.4  Platelets 150 - 450 x10E3/uL 236 232 252  Lab Results  Component Value Date/Time   VD25OH 45.5 09/14/2013 10:00 AM   VD25OH 50 03/14/2013 11:40 AM    Clinical ASCVD: No  The 10-year ASCVD risk score (Arnett DK, et al., 2019) is: 44.2%   Values used to calculate the score:     Age: 42 years     Sex: Male     Is Non-Hispanic African American: No     Diabetic: Yes     Tobacco smoker: No     Systolic Blood  Pressure: 117 mmHg     Is BP treated: Yes     HDL Cholesterol: 39 mg/dL     Total Cholesterol: 151 mg/dL    Other: (CHADS2VASc if Afib, PHQ9 if depression, MMRC or CAT for COPD, ACT, DEXA)  Social History   Tobacco Use  Smoking Status Former   Packs/day: 1.00   Years: 15.00   Pack years: 15.00   Types: Cigarettes   Start date: 12/27/1965   Quit date: 06/16/1983   Years since quitting: 38.3  Smokeless Tobacco Former   Types: Chew  Tobacco Comments   Former smoker   BP Readings from Last 3 Encounters:  10/08/21 117/67  07/06/21 121/72  04/03/21 118/67   Pulse Readings from Last 3 Encounters:  10/08/21 70  07/06/21 71  04/03/21 62   Wt Readings from Last 3 Encounters:  10/08/21 161 lb (73 kg)  07/06/21 169 lb (76.7 kg)  04/03/21 167 lb (75.8 kg)    Assessment: Review of patient past medical history, allergies, medications, health status, including review of consultants reports, laboratory and other test data, was performed as part of comprehensive evaluation and provision of chronic care management services.   SDOH:  (Social Determinants of Health) assessments and interventions performed:    CCM Care Plan  No Known Allergies  Medications Reviewed Today     Reviewed by Lavera Guise, [facility] (Pharmacist) on 10/11/21 at [place] List Status: <None>   Medication Order Taking? Sig Documenting Provider Last Dose Status Informant  Accu-Chek Softclix Lancets lancets [id number] No test blood sugar once daily. Dx: E11.65 Dettinger, Fransisca Kaufmann, MD Taking Active   aspirin 81 MG tablet [id number] No Take 81 mg by mouth daily. [provider] Taking Active Multiple Informants  Blood Glucose Monitoring Suppl (ACCU-CHEK GUIDE ME) w/Device KIT [id number] No USE TO TEST BLOOD SUGAR DAILY AS DIRECTED. DX E11.65 Dettinger, Fransisca Kaufmann, MD Taking Active   calcium carbonate (OS-CAL) 600 MG TABS [id number] No Take 600 mg by mouth daily with breakfast.  [provider] Taking Active  Multiple Informants  CINNAMON PO [id number] No Take 1,000 mg by mouth daily.  [provider] Taking Active Multiple Informants  fish oil-omega-3 fatty acids 1000 MG capsule [id number] No Take 2 g by mouth daily. [provider] Taking Active Multiple Informants  glimepiride (AMARYL) 4 MG tablet [id number] No Take 1 tablet (4 mg total) by mouth daily with breakfast. Dettinger, Fransisca Kaufmann, MD Taking Active   glucose blood (ACCU-CHEK GUIDE) test strip [id number] No test blood sugar once daily. Dx: E11.65 Dettinger, Fransisca Kaufmann, MD Taking Active   lisinopril (ZESTRIL) 20 MG tablet [id number] No Take 1 tablet (20 mg total) by mouth daily. Dettinger, Fransisca Kaufmann, MD Taking Active   metFORMIN (GLUCOPHAGE) 1000 MG tablet [id number] No Take 1 tablet (1,000 mg total) by mouth 2 (two) times daily with a meal. Dettinger, Fransisca Kaufmann, MD Taking Active   Polyethyl Glycol-Propyl Glycol (SYSTANE HYDRATION PF OP) [id number] No Apply  to eye in the morning, at noon, and at bedtime. [provider] Taking Active   pravastatin (PRAVACHOL) 80 MG tablet [id number] No Take 1 tablet (80 mg total) by mouth daily. Dettinger, Fransisca Kaufmann, MD Taking Active   Semaglutide, 1 MG/DOSE, (OZEMPIC, 1 MG/DOSE,) 2 MG/1.5ML SOPN [id number] No Inject 1 mg into the skin once a week. 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   Type 2 diabetes mellitus with hyperlipidemia ([place])    Diabetic retinopathy ([place]) 03/25/2017   Type 2 diabetes mellitus not at goal [facility]) 06/17/2014   Mixed hyperlipidemia due to type 2 diabetes mellitus ([place]) 06/17/2014   Hypertension associated with diabetes ([place]) 06/17/2014   Overweight (BMI 25.0-29.9) 06/17/2014    Immunization History  Administered Date(s) Administered   Fluad Quad(high Dose 65+) 09/05/2020, 07/06/2021   Influenza, High Dose Seasonal PF 06/06/2014, 06/15/2015, 08/28/2018   Influenza,inj,Quad  PF,6+ Mos 08/13/2016, 07/08/2017   Moderna Sars-Covid-2 Vaccination 11/15/2019, 12/14/2019   Pneumococcal Conjugate-13 07/15/2014   Pneumococcal Polysaccharide-23 07/10/2010   Tdap 06/01/2015   Zoster Recombinat (Shingrix) 10/08/2021    Conditions to be addressed/monitored: HLD and DMII  Care Plan : PHARMD MEDICATION  MANAGEMENT  Updates made by Lavera Guise, RPH since 10/11/2021 12:00 AM     Problem: DISEASE PROGRESSION PREVENTION      Long-Range Goal: T2DM   Recent Progress: Not on track  Priority: High  Note:   Current Barriers:  Unable to independently afford treatment regimen Unable to maintain control of T2DM   Pharmacist Clinical Goal(s):  Over the next 90 days, patient will verbalize ability to afford treatment regimen maintain control of T2DM as evidenced by GOAL A1C<7%, IMPROVED GLYCEMIC CONTROL  through collaboration with PharmD and provider.    Interventions: 1:1 collaboration with Dettinger, Fransisca Kaufmann, MD regarding development and update of comprehensive plan of care as evidenced by provider attestation and co-signature Inter-disciplinary care team collaboration (see longitudinal plan of care) Comprehensive medication review performed; medication list updated in electronic medical record  Diabetes: Controlled-a1c 7%; current treatment: OZEMPIC 1MG SQ WEEKLY; METFORMIN,- gfr 64 GLIMEPIRIDE  Denies personal and family history of Medullary thyroid cancer (MTC) A1C HAS INCREASED WITHOUT OZEMPIC THERAPY (shipment received this month from patient assistance program) Recommend d/c GLIMEPIRIDE Current glucose readings: fasting glucose: NOT CHECKING, post prandial glucose: N/A Denies hypoglycemic/hyperglycemic symptoms Discussed meal planning options and Plate method for healthy eating Avoid sugary drinks and desserts Incorporate balanced protein, non starchy veggies, 1 serving of carbohydrate with each meal Increase water intake Increase physical activity as  able Current exercise: N/A Educated on [place], DIET (heart healthy diet for t2dm, hld), MEDICATIONS Assessed patient finances. Application submitted for re-enrollment in [place] to PCP office when refill due--just received 3 month supply in December 2022   Patient Goals/Self-Care Activities Over the next 90 days, patient will:  - take medications as prescribed check glucose 3 times weekly, document, and provide at future appointments  Follow Up Plan: Telephone follow up appointment with care management team member scheduled for: 3 months      Medication Assistance: Application for [id number] re enrollment (ozempic)  medication assistance program. in process.  Anticipated assistance start date TBD.  See plan of care for additional detail.  Patient's preferred pharmacy is:  [id number] [place], [address]  [place] [address] Phone: [phone] Fax: [phone]  Follow Up:  Patient agrees to Care Plan and Follow-up.  Plan: Telephone follow up appointment with care management team member scheduled for:  12/2021   Regina Eck, PharmD, BCPS Clinical Pharmacist, [place]  II Phone [phone]

## 2021-10-11 NOTE — Patient Instructions (Signed)
Visit Information  Thank you for taking time to visit with me today. Please don't hesitate to contact me if I can be of assistance to you before our next scheduled telephone appointment.  Following are the goals we discussed today:  Current Barriers:  Unable to independently afford treatment regimen Unable to maintain control of T2DM   Pharmacist Clinical Goal(s):  Over the next 90 days, patient will verbalize ability to afford treatment regimen maintain control of T2DM as evidenced by GOAL A1C<7%, IMPROVED GLYCEMIC CONTROL  through collaboration with PharmD and provider.    Interventions: 1:1 collaboration with Dettinger, Fransisca Kaufmann, MD regarding development and update of comprehensive plan of care as evidenced by provider attestation and co-signature Inter-disciplinary care team collaboration (see longitudinal plan of care) Comprehensive medication review performed; medication list updated in electronic medical record  Diabetes: Controlled-a1c 7%; current treatment: OZEMPIC 1MG  SQ WEEKLY; METFORMIN,- gfr 64 GLIMEPIRIDE  Denies personal and family history of Medullary thyroid cancer (MTC) A1C HAS INCREASED WITHOUT OZEMPIC THERAPY (shipment received this month from patient assistance program) Recommend d/c GLIMEPIRIDE Current glucose readings: fasting glucose: NOT CHECKING, post prandial glucose: N/A Denies hypoglycemic/hyperglycemic symptoms Discussed meal planning options and Plate method for healthy eating Avoid sugary drinks and desserts Incorporate balanced protein, non starchy veggies, 1 serving of carbohydrate with each meal Increase water intake Increase physical activity as able Current exercise: N/A Educated on [place], DIET, [place] patient finances. Application submitted for re-enrollment in [place] WILL to PCP office when refill due--just received 3 month supply in December 2022   Patient Goals/Self-Care  Activities Over the next 90 days, patient will:  - take medications as prescribed check glucose 3 times weekly, document, and provide at future appointments  Follow Up Plan: Telephone follow up appointment with care management team member scheduled for: 3 months   Please call the care guide team at [phone] if you need to cancel or reschedule your appointment.    The patient verbalized understanding of instructions, educational materials, and care plan provided today and declined offer to receive copy of patient instructions, educational materials, and care plan.   Signature Regina Eck, PharmD, BCPS Clinical Pharmacist, [place]  II Phone [phone]

## 2021-11-03 DIAGNOSIS — E1169 Type 2 diabetes mellitus with other specified complication: Secondary | ICD-10-CM

## 2021-11-03 DIAGNOSIS — E785 Hyperlipidemia, unspecified: Secondary | ICD-10-CM

## 2021-11-03 DIAGNOSIS — E782 Mixed hyperlipidemia: Secondary | ICD-10-CM

## 2021-12-03 IMAGING — US US CAROTID DUPLEX BILAT
1 series · 13 of 24 positions shown · non-contrast
Comparison: None.

CLINICAL DATA: 73-year-old male with abnormal ophthalmological exam

EXAM:
BILATERAL CAROTID DUPLEX ULTRASOUND
TECHNIQUE: Gray scale imaging, color Doppler and duplex ultrasound were
performed of bilateral carotid and vertebral arteries in the neck.

[Series 1: us carotid duplex bilat · 0.06mm/px · 13 of 76 slices shown]
[im 1/76]
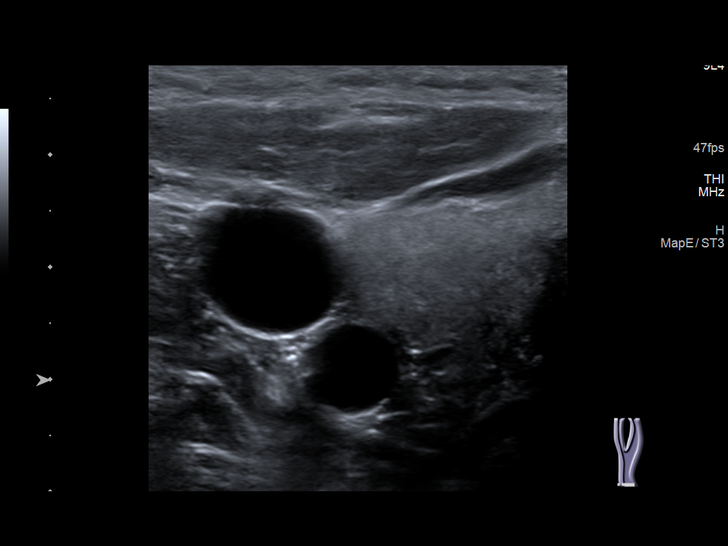
[im 7/76]
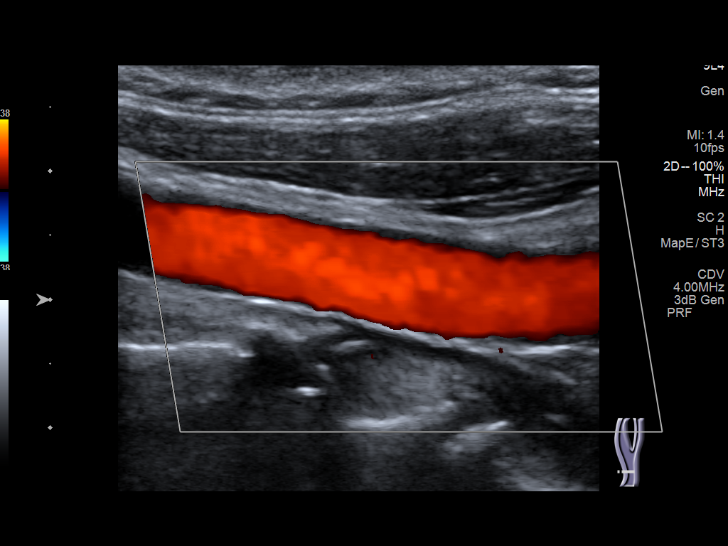
[im 14/76]
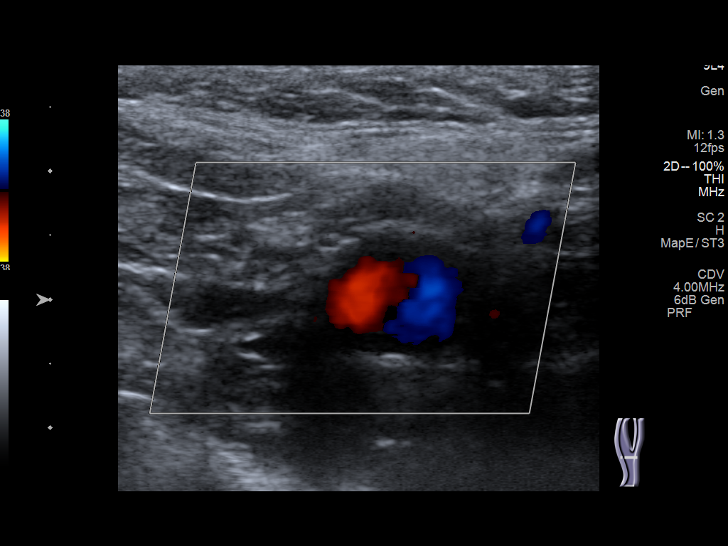
[im 20/76]
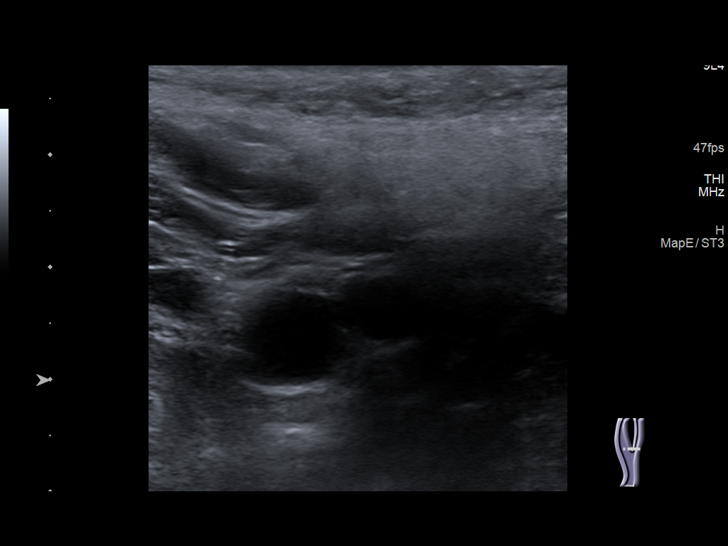
[im 27/76]
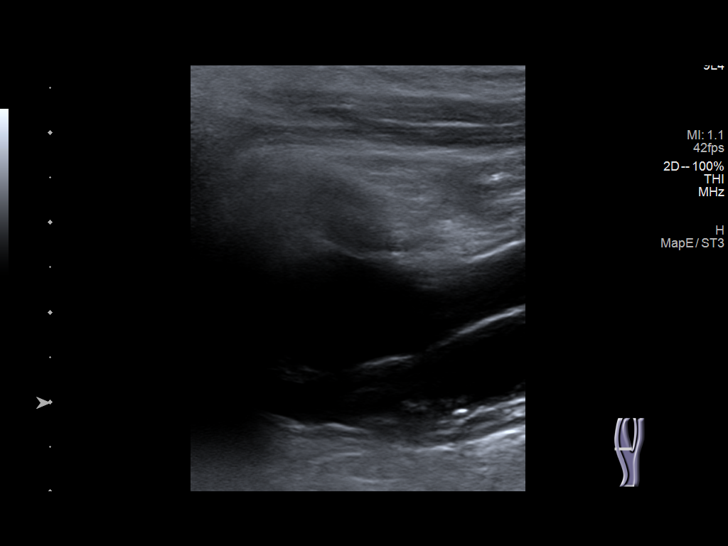
[im 33/76]
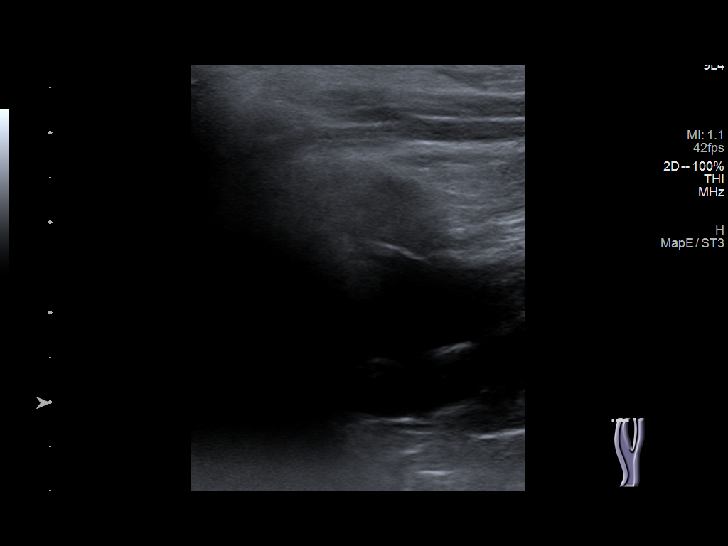
[im 40/76]
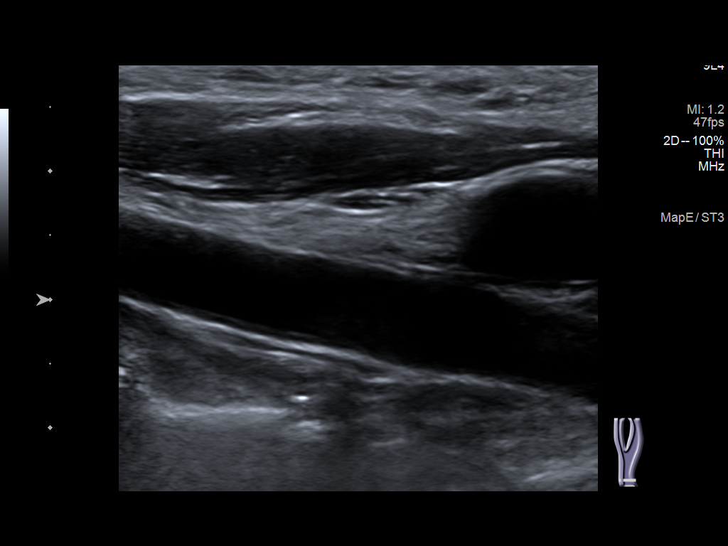
[im 43/76]
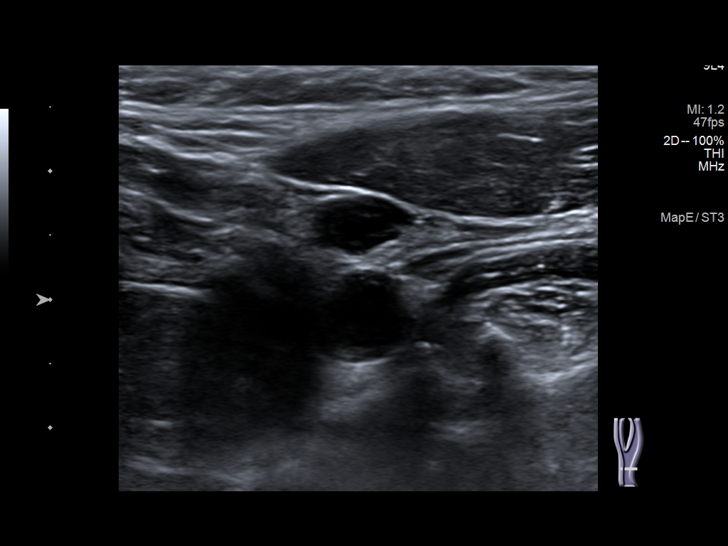
[im 49/76]
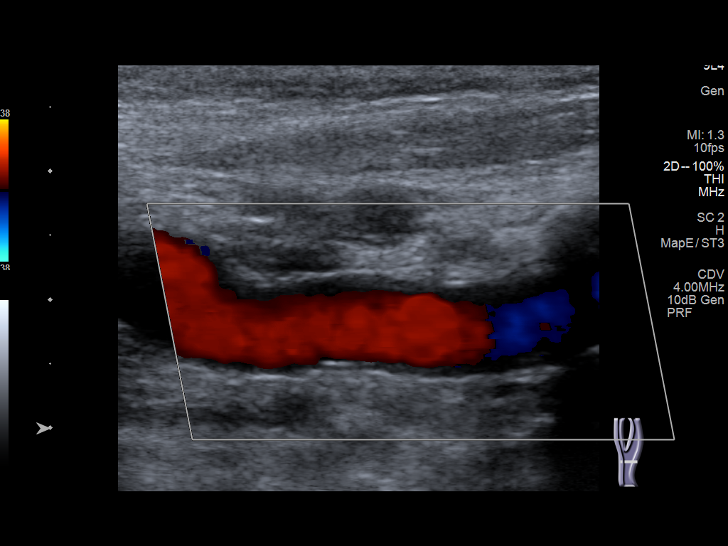
[im 56/76]
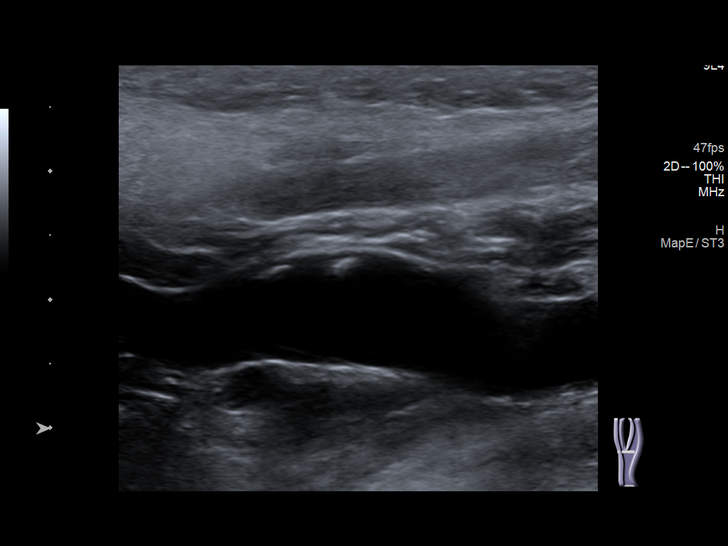
[im 62/76]
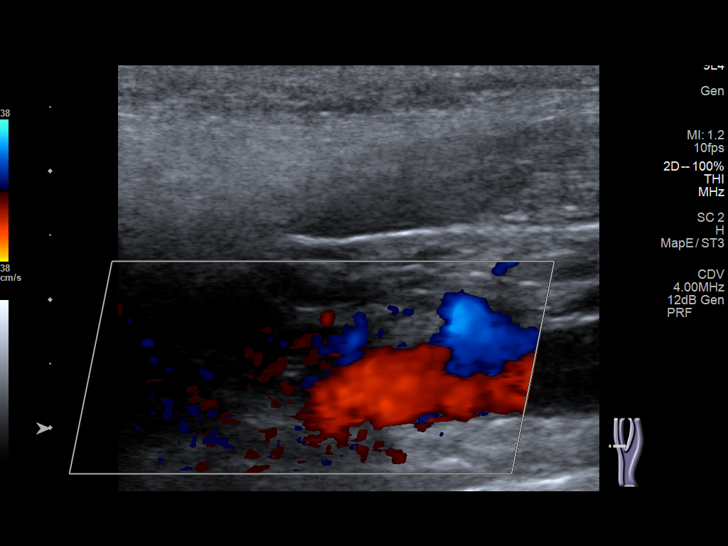
[im 69/76]
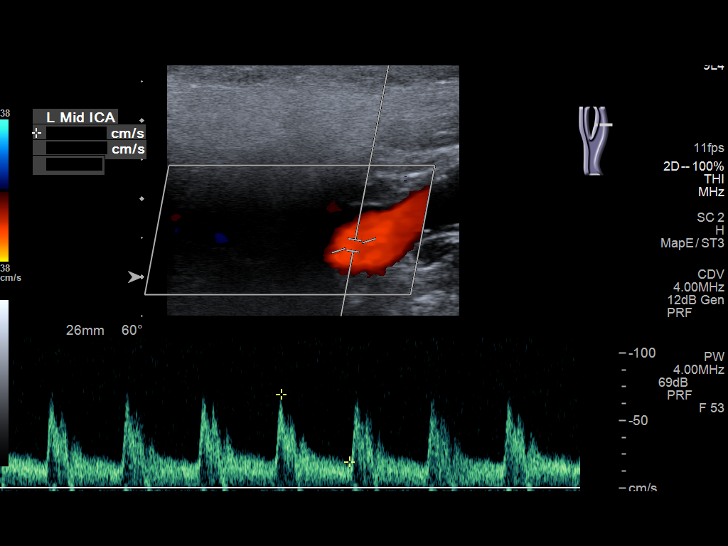
[im 76/76]
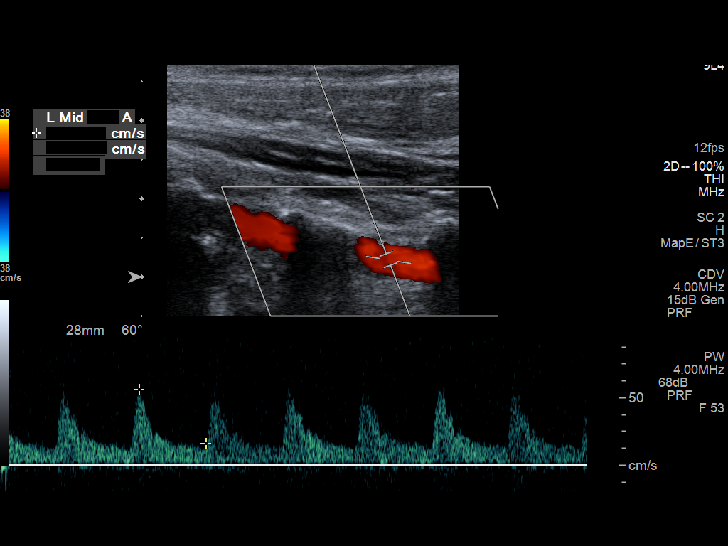

[13 of 24 positions shown; findings below may reference images not displayed]

FINDINGS: Criteria: Quantification of carotid stenosis is based on velocity
parameters that correlate the residual internal carotid diameter
with NASCET-based stenosis levels, using the diameter of the distal
internal carotid lumen as the denominator for stenosis measurement.

The following velocity measurements were obtained:

RIGHT
ICA: 62/19 cm/sec
CCA: 111/15 cm/sec

SYSTOLIC ICA/CCA RATIO:

ECA:  99 cm/sec

LEFT

ICA: 84/28 cm/sec

CCA: 128/22 cm/sec

SYSTOLIC ICA/CCA RATIO:

ECA:  67 cm/sec

RIGHT CAROTID ARTERY: Trace heterogeneous atherosclerotic plaque in
the proximal internal carotid artery. By peak systolic velocity
criteria, the estimated stenosis is less than 50%.

RIGHT VERTEBRAL ARTERY:  Patent with normal antegrade flow.

LEFT CAROTID ARTERY: Trace heterogeneous atherosclerotic plaque in
the proximal internal carotid artery. By peak systolic velocity
criteria, the estimated stenosis remains less than 50%.

LEFT VERTEBRAL ARTERY:  Patent with normal antegrade flow.
IMPRESSION: 1. Mild (1-49%) stenosis proximal right internal carotid artery
secondary to trace heterogeneous atherosclerotic plaque.
2. Mild (1-49%) stenosis proximal left internal carotid artery
secondary to trace heterogeneous atherosclerotic plaque.
3. The vertebral arteries are patent with normal antegrade flow.

## 2021-12-09 ENCOUNTER — Telehealth: Payer: Medicare HMO

## 2021-12-17 ENCOUNTER — Ambulatory Visit (INDEPENDENT_AMBULATORY_CARE_PROVIDER_SITE_OTHER): Payer: Medicare PPO

## 2021-12-17 VITALS — Wt 161.0 lb

## 2021-12-17 DIAGNOSIS — Z0000 Encounter for general adult medical examination without abnormal findings: Secondary | ICD-10-CM
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
## 2021-12-17 NOTE — Patient Instructions (Signed)
Mr. George [name] , ?Thank you for taking time to come for your Medicare Wellness Visit. I appreciate your ongoing commitment to your health goals. Please review the following plan we discussed and let me know if I can assist you in the future.  ? ?Screening recommendations/referrals: ?Colonoscopy: no longer required - patient declines ?Recommended yearly ophthalmology/optometry visit for glaucoma screening and checkup ?Recommended yearly dental visit for hygiene and checkup ? ?Vaccinations: ?Influenza vaccine: done 07/06/21 - Repeat annually ?Pneumococcal vaccine: done 07/10/2010 & 07/15/2014 - ask about Prevnar-20 ?Tdap vaccine: Done 06/01/2015 - Repeat in 10 years ?Shingles vaccine: Due - Shingrix is 2 doses 2-6 months apart and over 90% effective     ?Covid-19: Done 11/15/2019 & 12/14/2019 - declines boosters ? ?Advanced directives: Advance directive discussed with you today. Even though you declined this today, please call our office should you change your mind, and we can give you the proper paperwork for you to fill out.  ? ?Conditions/risks identified: Aim for 30 minutes of exercise or brisk walking, 6-8 glasses of water, and 5 servings of fruits and vegetables each day.  ? ?Next appointment: Follow up in one year for your annual wellness visit.  ? ?Preventive Care 3165 Years and Older, Male ? ?Preventive care refers to lifestyle choices and visits with your health care provider that can promote health and wellness. ?What does preventive care include? ?A yearly physical exam. This is also called an annual well check. ?Dental exams once or twice a year. ?Routine eye exams. Ask your health care provider how often you should have your eyes checked. ?Personal lifestyle choices, including: ?Daily care of your teeth and gums. ?Regular physical activity. ?Eating a healthy diet. ?Avoiding tobacco and drug use. ?Limiting alcohol use. ?Practicing safe sex. ?Taking low doses of aspirin every day. ?Taking vitamin and mineral  supplements as recommended by your health care provider. ?What happens during an annual well check? ?The services and screenings done by your health care provider during your annual well check will depend on your age, overall health, lifestyle risk factors, and family history of disease. ?Counseling  ?Your health care provider may ask you questions about your: ?Alcohol use. ?Tobacco use. ?Drug use. ?Emotional well-being. ?Home and relationship well-being. ?Sexual activity. ?Eating habits. ?History of falls. ?Memory and ability to understand (cognition). ?Work and work Astronomerenvironment. ?Screening  ?You may have the following tests or measurements: ?Height, weight, and BMI. ?Blood pressure. ?Lipid and cholesterol levels. These may be checked every 5 years, or more frequently if you are over 77 years old. ?Skin check. ?Lung cancer screening. You may have this screening every year starting at age 77 if you have a 30-pack-year history of smoking and currently smoke or have quit within the past 15 years. ?Fecal occult blood test (FOBT) of the stool. You may have this test every year starting at age 77. ?Flexible sigmoidoscopy or colonoscopy. You may have a sigmoidoscopy every 5 years or a colonoscopy every 10 years starting at age 77. ?Prostate cancer screening. Recommendations will vary depending on your family history and other risks. ?Hepatitis C blood test. ?Hepatitis B blood test. ?Sexually transmitted disease (STD) testing. ?Diabetes screening. This is done by checking your blood sugar (glucose) after you have not eaten for a while (fasting). You may have this done every 1-3 years. ?Abdominal aortic aneurysm (AAA) screening. You may need this if you are a current or former smoker. ?Osteoporosis. You may be screened starting at age 77 if you are at high risk. ?Talk  with your health care provider about your test results, treatment options, and if necessary, the need for more tests. ?Vaccines  ?Your health care provider  may recommend certain vaccines, such as: ?Influenza vaccine. This is recommended every year. ?Tetanus, diphtheria, and acellular pertussis (Tdap, Td) vaccine. You may need a Td booster every 10 years. ?Zoster vaccine. You may need this after age 110. ?Pneumococcal 13-valent conjugate (PCV13) vaccine. One dose is recommended after age 28. ?Pneumococcal polysaccharide (PPSV23) vaccine. One dose is recommended after age 45. ?Talk to your health care provider about which screenings and vaccines you need and how often you need them. ?This information is not intended to replace advice given to you by your health care provider. Make sure you discuss any questions you have with your health care provider. ?Document Released: 10/17/2015 Document Revised: 06/09/2016 Document Reviewed: 07/22/2015 ?Elsevier Interactive Patient Education ? 2017 Elsevier Inc. ? ?Fall Prevention in the Home ?Falls can cause injuries. They can happen to people of all ages. There are many things you can do to make your home safe and to help prevent falls. ?What can I do on the outside of my home? ?Regularly fix the edges of walkways and driveways and fix any cracks. ?Remove anything that might make you trip as you walk through a door, such as a raised step or threshold. ?Trim any bushes or trees on the path to your home. ?Use bright outdoor lighting. ?Clear any walking paths of anything that might make someone trip, such as rocks or tools. ?Regularly check to see if handrails are loose or broken. Make sure that both sides of any steps have handrails. ?Any raised decks and porches should have guardrails on the edges. ?Have any leaves, snow, or ice cleared regularly. ?Use sand or salt on walking paths during winter. ?Clean up any spills in your garage right away. This includes oil or grease spills. ?What can I do in the bathroom? ?Use night lights. ?Install grab bars by the toilet and in the tub and shower. Do not use towel bars as grab bars. ?Use  non-skid mats or decals in the tub or shower. ?If you need to sit down in the shower, use a plastic, non-slip stool. ?Keep the floor dry. Clean up any water that spills on the floor as soon as it happens. ?Remove soap buildup in the tub or shower regularly. ?Attach bath mats securely with double-sided non-slip rug tape. ?Do not have throw rugs and other things on the floor that can make you trip. ?What can I do in the bedroom? ?Use night lights. ?Make sure that you have a light by your bed that is easy to reach. ?Do not use any sheets or blankets that are too big for your bed. They should not hang down onto the floor. ?Have a firm chair that has side arms. You can use this for support while you get dressed. ?Do not have throw rugs and other things on the floor that can make you trip. ?What can I do in the kitchen? ?Clean up any spills right away. ?Avoid walking on wet floors. ?Keep items that you use a lot in easy-to-reach places. ?If you need to reach something above you, use a strong step stool that has a grab bar. ?Keep electrical cords out of the way. ?Do not use floor polish or wax that makes floors slippery. If you must use wax, use non-skid floor wax. ?Do not have throw rugs and other things on the floor that can make you  trip. ?What can I do with my stairs? ?Do not leave any items on the stairs. ?Make sure that there are handrails on both sides of the stairs and use them. Fix handrails that are broken or loose. Make sure that handrails are as long as the stairways. ?Check any carpeting to make sure that it is firmly attached to the stairs. Fix any carpet that is loose or worn. ?Avoid having throw rugs at the top or bottom of the stairs. If you do have throw rugs, attach them to the floor with carpet tape. ?Make sure that you have a light switch at the top of the stairs and the bottom of the stairs. If you do not have them, ask someone to add them for you. ?What else can I do to help prevent falls? ?Wear  shoes that: ?Do not have high heels. ?Have rubber bottoms. ?Are comfortable and fit you well. ?Are closed at the toe. Do not wear sandals. ?If you use a stepladder: ?Make sure that it is fully opened. Do not climb a cl

## 2022-01-07 ENCOUNTER — Encounter: Payer: Self-pay | Admitting: Family Medicine

## 2022-01-07 ENCOUNTER — Ambulatory Visit (INDEPENDENT_AMBULATORY_CARE_PROVIDER_SITE_OTHER): Payer: Medicare PPO | Admitting: Family Medicine

## 2022-01-07 VITALS — BP 136/78 | HR 60 | Ht 67.0 in | Wt 164.0 lb

## 2022-01-07 DIAGNOSIS — I152 Hypertension secondary to endocrine disorders: Secondary | ICD-10-CM | POA: Diagnosis not present

## 2022-01-07 DIAGNOSIS — Z23 Encounter for immunization: Secondary | ICD-10-CM | POA: Diagnosis not present

## 2022-01-07 DIAGNOSIS — E782 Mixed hyperlipidemia: Secondary | ICD-10-CM | POA: Diagnosis not present

## 2022-01-07 DIAGNOSIS — E11319 Type 2 diabetes mellitus with unspecified diabetic retinopathy without macular edema: Secondary | ICD-10-CM | POA: Diagnosis not present

## 2022-01-07 DIAGNOSIS — E1159 Type 2 diabetes mellitus with other circulatory complications: Secondary | ICD-10-CM

## 2022-01-07 DIAGNOSIS — E1169 Type 2 diabetes mellitus with other specified complication: Secondary | ICD-10-CM

## 2022-01-07 DIAGNOSIS — E119 Type 2 diabetes mellitus without complications: Secondary | ICD-10-CM | POA: Diagnosis not present

## 2022-01-07 LAB — BAYER DCA HB A1C WAIVED: HB A1C (BAYER DCA - WAIVED): 6.4 % — ABNORMAL HIGH (ref 4.8–5.6)

## 2022-01-07 NOTE — Progress Notes (Signed)
? ?BP 136/78   Pulse 60   Ht '5\' 7"'  (1.702 m)   Wt 164 lb (74.4 kg)   SpO2 98%   BMI 25.69 kg/m?   ? ?Subjective:  ? ?Patient ID: George [name], male    DOB: Mar 09, 1946, 76 y.o.   MRN: [id number] ? ?HPI: ?George [name] is a 76 y.o. male presenting on 01/07/2022 for Medical Management of Chronic Issues, Diabetes, and Hyperlipidemia ? ? ?HPI ?Type 2 diabetes mellitus ?Patient comes in today for recheck of his diabetes. Patient has been currently taking metformin and Ozempic and glimepiride, A1c looks good at 6.4. Patient is currently on an ACE inhibitor/ARB. Patient has not seen an ophthalmologist this year. Patient denies any issues with their feet. The symptom started onset as an adult retinopathy and hyperlipidemia and hypertension ARE RELATED TO DM  ? ?Hyperlipidemia ?Patient is coming in for recheck of his hyperlipidemia. The patient is currently taking fish oils from pravastatin. They deny any issues with myalgias or history of liver damage from it. They deny any focal numbness or weakness or chest pain.  ? ?Hypertension ?Patient is currently on lisinopril, and their blood pressure today is 136/78. Patient denies any lightheadedness or dizziness. Patient denies headaches, blurred vision, chest pains, shortness of breath, or weakness. Denies any side effects from medication and is content with current medication.  ? ?Relevant past medical, surgical, family and social history reviewed and updated as indicated. Interim medical history since our last visit reviewed. ?Allergies and medications reviewed and updated. ? ?Review of Systems  ?Constitutional:  Negative for chills and fever.  ?Eyes:  Negative for visual disturbance.  ?Respiratory:  Negative for shortness of breath and wheezing.   ?Cardiovascular:  Negative for chest pain and leg swelling.  ?Musculoskeletal:  Negative for back pain and gait problem.  ?Skin:  Negative for rash.  ?Neurological:  Negative for dizziness, weakness and light-headedness.   ?All other systems reviewed and are negative. ? ?Per HPI unless specifically indicated above ? ? ?Allergies as of 01/07/2022   ?No Known Allergies ?  ? ?  ?Medication List  ?  ? ?  ? Accurate as of January 07, 2022 11:11 AM. If you have any questions, ask your nurse or doctor.  ?  ?  ? ?  ? ?Accu-Chek Guide Me w/Device Kit ?USE TO TEST BLOOD SUGAR DAILY AS DIRECTED. DX E11.65 ?  ?Accu-Chek Guide test strip ?Generic drug: glucose blood ?test blood sugar once daily. Dx: E11.65 ?  ?Accu-Chek Softclix Lancets lancets ?test blood sugar once daily. Dx: E11.65 ?  ?aspirin 81 MG tablet ?Take 81 mg by mouth daily. ?  ?calcium carbonate 600 MG Tabs tablet ?Commonly known as: OS-CAL ?Take 600 mg by mouth daily with breakfast. ?  ?CINNAMON PO ?Take 1,000 mg by mouth daily. ?  ?fish oil-omega-3 fatty acids 1000 MG capsule ?Take 2 g by mouth daily. ?  ?glimepiride 4 MG tablet ?Commonly known as: AMARYL ?Take 1 tablet (4 mg total) by mouth daily with breakfast. ?  ?lisinopril 20 MG tablet ?Commonly known as: ZESTRIL ?Take 1 tablet (20 mg total) by mouth daily. ?  ?metFORMIN 1000 MG tablet ?Commonly known as: GLUCOPHAGE ?Take 1 tablet (1,000 mg total) by mouth 2 (two) times daily with a meal. ?  ?Ozempic (1 MG/DOSE) 2 MG/1.5ML Sopn ?Generic drug: Semaglutide (1 MG/DOSE) ?Inject 1 mg into the skin once a week. ?  ?pravastatin 80 MG tablet ?Commonly known as: PRAVACHOL ?Take 1 tablet (80 mg total)  by mouth daily. ?  ?SYSTANE HYDRATION PF OP ?Apply to eye in the morning, at noon, and at bedtime. ?  ? ?  ? ? ? ?Objective:  ? ?BP 136/78   Pulse 60   Ht '5\' 7"'  (1.702 m)   Wt 164 lb (74.4 kg)   SpO2 98%   BMI 25.69 kg/m?   ?Wt Readings from Last 3 Encounters:  ?01/07/22 164 lb (74.4 kg)  ?12/17/21 161 lb (73 kg)  ?10/08/21 161 lb (73 kg)  ?  ?Physical Exam ?Vitals and nursing note reviewed.  ?Constitutional:   ?   General: He is not in acute distress. ?   Appearance: He is well-developed. He is not diaphoretic.  ?Eyes:  ?   General: No  scleral icterus. ?   Conjunctiva/sclera: Conjunctivae normal.  ?Neck:  ?   Thyroid: No thyromegaly.  ?Cardiovascular:  ?   Rate and Rhythm: Normal rate and regular rhythm.  ?   Heart sounds: Normal heart sounds. No murmur heard. ?Pulmonary:  ?   Effort: Pulmonary effort is normal. No respiratory distress.  ?   Breath sounds: Normal breath sounds. No wheezing.  ?Musculoskeletal:     ?   General: Normal range of motion.  ?   Cervical back: Neck supple.  ?Lymphadenopathy:  ?   Cervical: No cervical adenopathy.  ?Skin: ?   General: Skin is warm and dry.  ?   Findings: No rash.  ?Neurological:  ?   Mental Status: He is alert. Mental status is at baseline.  ?   Coordination: Coordination normal.  ?Psychiatric:     ?   Behavior: Behavior normal.  ? ? ?A1c looks good at 6.4 no changes ? ?Assessment & Plan:  ? ?Problem List Items Addressed This Visit   ? ?  ? Cardiovascular and Mediastinum  ? Hypertension associated with diabetes ([place])  ? Relevant Orders  ? CBC with Differential/Platelet  ? CMP14+EGFR  ? Lipid panel  ? Bayer DCA Hb A1c Waived  ?  ? Endocrine  ? Type 2 diabetes mellitus not at goal [facility])  ? Mixed hyperlipidemia due to type 2 diabetes mellitus ([employer])  ? Relevant Orders  ? CBC with Differential/Platelet  ? CMP14+EGFR  ? Lipid panel  ? Bayer DCA Hb A1c Waived  ? Diabetic retinopathy ([place])  ? ?Other Visit Diagnoses   ? ? Type 2 diabetes mellitus without complication, without long-term current use of insulin ([place])    -  Primary  ? Relevant Orders  ? CBC with Differential/Platelet  ? CMP14+EGFR  ? Lipid panel  ? Bayer DCA Hb A1c Waived  ? Need for shingles vaccine      ? Relevant Orders  ? Varicella-zoster vaccine IM (Shingrix) (Completed)  ? ?  ?Continue current medicine, blood pressure and blood sugar look good. ? ?Follow up plan: ?Return in about 3 months (around 04/08/2022), or if symptoms worsen or fail to improve, for Diabetes. ? ?Counseling provided for all of the vaccine components ?Orders Placed This  Encounter  ?Procedures  ? Varicella-zoster vaccine IM (Shingrix)  ? CBC with Differential/Platelet  ? CMP14+EGFR  ? Lipid panel  ? Bayer DCA Hb A1c Waived  ? ? ?Caryl Pina, MD ?[place] ?01/07/2022, 11:11 AM ? ? ? ? ?

## 2022-01-08 LAB — CBC WITH DIFFERENTIAL/PLATELET
Basophils Absolute: 0 10*3/uL (ref 0.0–0.2)
Basos: 1 %
EOS (ABSOLUTE): 0 10*3/uL (ref 0.0–0.4)
Eos: 1 %
Hematocrit: 39.8 % (ref 37.5–51.0)
Hemoglobin: 13.4 g/dL (ref 13.0–17.7)
Immature Grans (Abs): 0 10*3/uL (ref 0.0–0.1)
Immature Granulocytes: 0 %
Lymphocytes Absolute: 1.8 10*3/uL (ref 0.7–3.1)
Lymphs: 29 %
MCH: 29.1 pg (ref 26.6–33.0)
MCHC: 33.7 g/dL (ref 31.5–35.7)
MCV: 87 fL (ref 79–97)
Monocytes Absolute: 0.3 10*3/uL (ref 0.1–0.9)
Monocytes: 5 %
Neutrophils Absolute: 4 10*3/uL (ref 1.4–7.0)
Neutrophils: 64 %
Platelets: 257 10*3/uL (ref 150–450)
RBC: 4.6 x10E6/uL (ref 4.14–5.80)
RDW: 12.8 % (ref 11.6–15.4)
WBC: 6.2 10*3/uL (ref 3.4–10.8)

## 2022-01-08 LAB — LIPID PANEL
Chol/HDL Ratio: 4.7 ratio (ref 0.0–5.0)
Cholesterol, Total: 208 mg/dL — ABNORMAL HIGH (ref 100–199)
HDL: 44 mg/dL (ref >39)
LDL Chol Calc (NIH): 147 mg/dL — ABNORMAL HIGH (ref 0–99)
Triglycerides: 94 mg/dL (ref 0–149)
VLDL Cholesterol Cal: 17 mg/dL (ref 5–40)

## 2022-01-08 LAB — CMP14+EGFR
ALT: 15 IU/L (ref 0–44)
AST: 18 IU/L (ref 0–40)
Albumin/Globulin Ratio: 2 (ref 1.2–2.2)
Albumin: 4.4 g/dL (ref 3.7–4.7)
Alkaline Phosphatase: 68 IU/L (ref 44–121)
BUN/Creatinine Ratio: 19 (ref 10–24)
BUN: 21 mg/dL (ref 8–27)
Bilirubin Total: 0.4 mg/dL (ref 0.0–1.2)
CO2: 24 mmol/L (ref 20–29)
Calcium: 9.5 mg/dL (ref 8.6–10.2)
Chloride: 105 mmol/L (ref 96–106)
Creatinine, Ser: 1.09 mg/dL (ref 0.76–1.27)
Globulin, Total: 2.2 g/dL (ref 1.5–4.5)
Glucose: 71 mg/dL (ref 70–99)
Potassium: 5 mmol/L (ref 3.5–5.2)
Sodium: 143 mmol/L (ref 134–144)
Total Protein: 6.6 g/dL (ref 6.0–8.5)
eGFR: 70 mL/min/{1.73_m2} (ref >59)

## 2022-02-02 DIAGNOSIS — Z7985 Long-term (current) use of injectable non-insulin antidiabetic drugs: Secondary | ICD-10-CM | POA: Diagnosis not present

## 2022-02-02 DIAGNOSIS — E119 Type 2 diabetes mellitus without complications: Secondary | ICD-10-CM | POA: Diagnosis not present

## 2022-02-02 DIAGNOSIS — Z6826 Body mass index (BMI) 26.0-26.9, adult: Secondary | ICD-10-CM | POA: Diagnosis not present

## 2022-02-02 DIAGNOSIS — Z7984 Long term (current) use of oral hypoglycemic drugs: Secondary | ICD-10-CM | POA: Diagnosis not present

## 2022-02-02 DIAGNOSIS — E663 Overweight: Secondary | ICD-10-CM | POA: Diagnosis not present

## 2022-02-02 DIAGNOSIS — Z7982 Long term (current) use of aspirin: Secondary | ICD-10-CM | POA: Diagnosis not present

## 2022-02-02 DIAGNOSIS — I10 Essential (primary) hypertension: Secondary | ICD-10-CM | POA: Diagnosis not present

## 2022-02-02 DIAGNOSIS — E785 Hyperlipidemia, unspecified: Secondary | ICD-10-CM | POA: Diagnosis not present

## 2022-02-02 DIAGNOSIS — H04129 Dry eye syndrome of unspecified lacrimal gland: Secondary | ICD-10-CM | POA: Diagnosis not present

## 2022-03-02 DIAGNOSIS — E113393 Type 2 diabetes mellitus with moderate nonproliferative diabetic retinopathy without macular edema, bilateral: Secondary | ICD-10-CM | POA: Diagnosis not present

## 2022-03-02 DIAGNOSIS — H40023 Open angle with borderline findings, high risk, bilateral: Secondary | ICD-10-CM | POA: Diagnosis not present

## 2022-03-09 ENCOUNTER — Telehealth: Payer: Medicare HMO

## 2022-03-23 ENCOUNTER — Telehealth: Payer: Medicare HMO

## 2022-04-07 ENCOUNTER — Telehealth: Payer: Self-pay | Admitting: Pharmacist

## 2022-04-07 ENCOUNTER — Ambulatory Visit: Payer: Medicare HMO | Admitting: Pharmacist

## 2022-04-07 DIAGNOSIS — E1169 Type 2 diabetes mellitus with other specified complication: Secondary | ICD-10-CM

## 2022-04-07 DIAGNOSIS — E119 Type 2 diabetes mellitus without complications: Secondary | ICD-10-CM

## 2022-04-07 NOTE — Progress Notes (Signed)
  Care Management   Follow Up Note   04/07/2022 Name: George [name] MRN: [id number] DOB: 1945-11-16   Referred by: Dettinger, Elige Radon, MD Reason for referral : diabetes   An unsuccessful telephone outreach was attempted today. The patient was referred to the case management team for assistance with care management and care coordination.   Follow Up Plan: Telephone follow up appointment with care management team member scheduled for:3 months    Kieth Brightly, PharmD, BCPS Clinical Pharmacist, [facility]  II Phone [phone]

## 2022-04-07 NOTE — Telephone Encounter (Signed)
  Care Management   Follow Up Note   04/07/2022 Name: George [name] MRN: [id number] DOB: [birth year]   Referred by: Dettinger, Elige Radon, MD Reason for referral : Appointment (Unsuccessful outreach)   An unsuccessful telephone outreach was attempted today. The patient was referred to the case management team for assistance with care management and care coordination.   Follow Up Plan: The patient has been provided with contact information for the care management team and has been advised to call with any health related questions or concerns.    Kieth Brightly, PharmD, BCPS Clinical Pharmacist, [facility]  II Phone [phone]

## 2022-04-12 ENCOUNTER — Encounter: Payer: Self-pay | Admitting: Family Medicine

## 2022-04-12 ENCOUNTER — Ambulatory Visit (INDEPENDENT_AMBULATORY_CARE_PROVIDER_SITE_OTHER): Payer: Medicare PPO | Admitting: Family Medicine

## 2022-04-12 DIAGNOSIS — E1169 Type 2 diabetes mellitus with other specified complication: Secondary | ICD-10-CM

## 2022-04-12 DIAGNOSIS — I152 Hypertension secondary to endocrine disorders: Secondary | ICD-10-CM

## 2022-04-12 DIAGNOSIS — E785 Hyperlipidemia, unspecified: Secondary | ICD-10-CM

## 2022-04-12 DIAGNOSIS — E119 Type 2 diabetes mellitus without complications: Secondary | ICD-10-CM | POA: Diagnosis not present

## 2022-04-12 DIAGNOSIS — E1159 Type 2 diabetes mellitus with other circulatory complications: Secondary | ICD-10-CM | POA: Diagnosis not present

## 2022-04-12 LAB — BAYER DCA HB A1C WAIVED: HB A1C (BAYER DCA - WAIVED): 6.5 % — ABNORMAL HIGH (ref 4.8–5.6)

## 2022-04-12 MED ORDER — GLIMEPIRIDE 4 MG PO TABS: 4.0000 mg | ORAL_TABLET | Freq: Every day | ORAL | 3 refills | Status: DC

## 2022-04-12 MED ORDER — METFORMIN HCL 1000 MG PO TABS: 1000.0000 mg | ORAL_TABLET | Freq: Two times a day (BID) | ORAL | 3 refills | Status: DC

## 2022-04-12 MED ORDER — ACCU-CHEK SOFTCLIX LANCETS MISC: 3 refills | Status: DC

## 2022-04-12 MED ORDER — PRAVASTATIN SODIUM 80 MG PO TABS: 80.0000 mg | ORAL_TABLET | Freq: Every day | ORAL | 3 refills | Status: DC

## 2022-04-12 MED ORDER — ACCU-CHEK GUIDE VI STRP: ORAL_STRIP | 3 refills | Status: AC

## 2022-04-12 MED ORDER — LISINOPRIL 20 MG PO TABS: 20.0000 mg | ORAL_TABLET | Freq: Every day | ORAL | 3 refills | Status: DC

## 2022-04-12 NOTE — Progress Notes (Signed)
BP 125/65   Pulse 69   Temp 98 F (36.7 C)   Ht 5\' 7"  (1.702 m)   Wt 170 lb (77.1 kg)   SpO2 98%   BMI 26.63 kg/m    Subjective:   Patient ID: George [name], male    DOB: Feb 06, 1946, 76 y.o.   MRN: [id number]  HPI: George [name] is a 76 y.o. male presenting on 04/12/2022 for Medical Management of Chronic Issues and Diabetes   HPI Type 2 diabetes mellitus Patient comes in today for recheck of his diabetes. Patient has been currently taking glimepiride and Ozempic and metformin, A1c looks good at 6.5. Patient is currently on an ACE inhibitor/ARB. Patient has not seen an ophthalmologist this year. Patient denies any issues with their feet. The symptom started onset as an adult hypertension and hyperlipidemia ARE RELATED TO DM   Hypertension Patient is currently on lisinopril, and their blood pressure today is 125/65. Patient denies any lightheadedness or dizziness. Patient denies headaches, blurred vision, chest pains, shortness of breath, or weakness. Denies any side effects from medication and is content with current medication.   Hyperlipidemia Patient is coming in for recheck of his hyperlipidemia. The patient is currently taking pravastatin and fish oil. They deny any issues with myalgias or history of liver damage from it. They deny any focal numbness or weakness or chest pain.   Relevant past medical, surgical, family and social history reviewed and updated as indicated. Interim medical history since our last visit reviewed. Allergies and medications reviewed and updated.  Review of Systems  Constitutional:  Negative for chills and fever.  Eyes:  Negative for visual disturbance.  Respiratory:  Negative for shortness of breath and wheezing.   Cardiovascular:  Negative for chest pain and leg swelling.  Musculoskeletal:  Negative for back pain and gait problem.  Skin:  Negative for rash.  Neurological:  Negative for dizziness and light-headedness.  All other systems  reviewed and are negative.   Per HPI unless specifically indicated above   Allergies as of 04/12/2022   No Known Allergies      Medication List        Accurate as of April 12, 2022  4:26 PM. If you have any questions, ask your nurse or doctor.          Accu-Chek Guide Me w/Device Kit USE TO TEST BLOOD SUGAR DAILY AS DIRECTED. DX E11.65   Accu-Chek Guide test strip Generic drug: glucose blood test blood sugar once daily. Dx: E11.65   Accu-Chek Softclix Lancets lancets test blood sugar once daily. Dx: E11.65   aspirin 81 MG tablet Take 81 mg by mouth daily.   calcium carbonate 600 MG Tabs tablet Commonly known as: OS-CAL Take 600 mg by mouth daily with breakfast.   CINNAMON PO Take 1,000 mg by mouth daily.   fish oil-omega-3 fatty acids 1000 MG capsule Take 2 g by mouth daily.   glimepiride 4 MG tablet Commonly known as: AMARYL Take 1 tablet (4 mg total) by mouth daily with breakfast.   lisinopril 20 MG tablet Commonly known as: ZESTRIL Take 1 tablet (20 mg total) by mouth daily.   metFORMIN 1000 MG tablet Commonly known as: GLUCOPHAGE Take 1 tablet (1,000 mg total) by mouth 2 (two) times daily with a meal.   Ozempic (1 MG/DOSE) 2 MG/1.5ML Sopn Generic drug: Semaglutide (1 MG/DOSE) Inject 1 mg into the skin once a week.   pravastatin 80 MG tablet Commonly known as: PRAVACHOL Take  1 tablet (80 mg total) by mouth daily.   SYSTANE HYDRATION PF OP Apply to eye in the morning, at noon, and at bedtime.         Objective:   BP 125/65   Pulse 69   Temp 98 F (36.7 C)   Ht 5' 7" (1.702 m)   Wt 170 lb (77.1 kg)   SpO2 98%   BMI 26.63 kg/m   Wt Readings from Last 3 Encounters:  04/12/22 170 lb (77.1 kg)  01/07/22 164 lb (74.4 kg)  12/17/21 161 lb (73 kg)    Physical Exam Vitals and nursing note reviewed.  Constitutional:      General: He is not in acute distress.    Appearance: He is well-developed. He is not diaphoretic.  Eyes:      General: No scleral icterus.    Conjunctiva/sclera: Conjunctivae normal.  Neck:     Thyroid: No thyromegaly.  Cardiovascular:     Rate and Rhythm: Normal rate and regular rhythm.     Heart sounds: Normal heart sounds. No murmur heard. Pulmonary:     Effort: Pulmonary effort is normal. No respiratory distress.     Breath sounds: Normal breath sounds. No wheezing.  Musculoskeletal:        General: No swelling. Normal range of motion.     Cervical back: Neck supple.  Lymphadenopathy:     Cervical: No cervical adenopathy.  Skin:    General: Skin is warm and dry.     Findings: No rash.  Neurological:     Mental Status: He is alert and oriented to person, place, and time.     Coordination: Coordination normal.  Psychiatric:        Behavior: Behavior normal.       Assessment & Plan:   Problem List Items Addressed This Visit       Cardiovascular and Mediastinum   Hypertension associated with diabetes ([place])     Endocrine   Type 2 diabetes mellitus not at goal [facility])   Other Visit Diagnoses     Type 2 diabetes mellitus with hyperlipidemia ([place])       Relevant Orders   Bayer DCA Hb A1c Waived   Lipid panel     A1c looks good at 6.5, blood pressure looks good, no changes.  He does have some decrease sensation and onychomycosis on his toenails and recommended to go see a podiatrist or get a pedicure.  Follow up plan: Return in about 3 months (around 07/13/2022), or if symptoms worsen or fail to improve, for Diabetes and hypertension.  Counseling provided for all of the vaccine components Orders Placed This Encounter  Procedures   Bayer [place] Hb A1c Waived   Lipid panel    Caryl Pina, MD [facility] 04/12/2022, 4:26 PM

## 2022-04-13 LAB — LIPID PANEL
Chol/HDL Ratio: 5.6 ratio — ABNORMAL HIGH (ref 0.0–5.0)
Cholesterol, Total: 214 mg/dL — ABNORMAL HIGH (ref 100–199)
HDL: 38 mg/dL — ABNORMAL LOW (ref >39)
LDL Chol Calc (NIH): 154 mg/dL — ABNORMAL HIGH (ref 0–99)
Triglycerides: 120 mg/dL (ref 0–149)
VLDL Cholesterol Cal: 22 mg/dL (ref 5–40)

## 2022-04-29 ENCOUNTER — Ambulatory Visit: Payer: Self-pay | Admitting: *Deleted

## 2022-04-29 DIAGNOSIS — E1169 Type 2 diabetes mellitus with other specified complication: Secondary | ICD-10-CM

## 2022-04-29 NOTE — Patient Instructions (Signed)
  George [name]  I have previously worked with you through the Chronic Care Management Program at [facility]. Due to program changes I am removing myself from your care team because you've either met our goals, your conditions are stable and no longer require care management, or we haven't engaged within the past 6 months. You are currently active with another CCM Team Member, Lottie Dawson, PharmD, you will remain active with her unless she reaches out to you with additional information. If you feel that you need RN Care Management services in the future,  please talk with your primary care provider and request a new referral for Care Management or Care Coordination. This does not affect your status as a patient at [facility].   Thank you for allowing me to participate in your your healthcare journey.  Chong Sicilian, BSN, RN-BC Embedded Chronic Care Manager [facility] [facility] Direct Dial: [phone]

## 2022-04-29 NOTE — Chronic Care Management (AMB) (Signed)
  Chronic Care Management   Note  04/29/2022 Name: George [name] MRN: [id number] DOB: [birth year]   Patient has either met RN Care Management goals, is stable from [place] Management perspective, or has not recently engaged with the RN Care Manager. I am removing RN Care Manager from Care Team and closing [place]. Patient is currently engaged with another CCM team member, Lottie Dawson, PharmD. If they are engaged with another CCM Team Member, I will forward this case closure encounter to them. Their PCP can place a new referral if the patient needs Care Management or Care Coordination services in the future.  Chong Sicilian, BSN, RN-BC Embedded Chronic Care Manager [facility] [place] [facility] Direct Dial: [phone]

## 2022-07-14 ENCOUNTER — Ambulatory Visit (INDEPENDENT_AMBULATORY_CARE_PROVIDER_SITE_OTHER): Payer: Medicare PPO | Admitting: Family Medicine

## 2022-07-14 ENCOUNTER — Encounter: Payer: Self-pay | Admitting: Family Medicine

## 2022-07-14 VITALS — BP 133/77 | HR 74 | Temp 97.4°F | Ht 67.0 in | Wt 159.0 lb

## 2022-07-14 DIAGNOSIS — E1159 Type 2 diabetes mellitus with other circulatory complications: Secondary | ICD-10-CM

## 2022-07-14 DIAGNOSIS — Z23 Encounter for immunization: Secondary | ICD-10-CM

## 2022-07-14 DIAGNOSIS — E11319 Type 2 diabetes mellitus with unspecified diabetic retinopathy without macular edema: Secondary | ICD-10-CM

## 2022-07-14 DIAGNOSIS — E782 Mixed hyperlipidemia: Secondary | ICD-10-CM | POA: Diagnosis not present

## 2022-07-14 DIAGNOSIS — E785 Hyperlipidemia, unspecified: Secondary | ICD-10-CM | POA: Diagnosis not present

## 2022-07-14 DIAGNOSIS — E1169 Type 2 diabetes mellitus with other specified complication: Secondary | ICD-10-CM | POA: Diagnosis not present

## 2022-07-14 DIAGNOSIS — E119 Type 2 diabetes mellitus without complications: Secondary | ICD-10-CM

## 2022-07-14 DIAGNOSIS — I152 Hypertension secondary to endocrine disorders: Secondary | ICD-10-CM

## 2022-07-14 LAB — BAYER DCA HB A1C WAIVED: HB A1C (BAYER DCA - WAIVED): 14 % — ABNORMAL HIGH (ref 4.8–5.6)

## 2022-07-14 NOTE — Addendum Note (Signed)
Addended by: Alphonzo Dublin on: 07/14/2022 04:38 PM   Modules accepted: Orders

## 2022-07-14 NOTE — Progress Notes (Signed)
BP 133/77   Pulse 74   Temp (!) 97.4 F (36.3 C)   Ht _0  (1.702 m)   Wt 159 lb (72.1 kg)   SpO2 96%   BMI 24.90 kg/m    Subjective:   Patient ID: George [name], male    DOB: 19-Feb-1946, 76 y.o.   MRN: [id number]  HPI: George [name] is a 76 y.o. male presenting on 07/14/2022 for Medical Management of Chronic Issues and Diabetes   HPI Type 2 diabetes mellitus Patient comes in today for recheck of his diabetes. Patient has been currently taking metformin and glimepiride and Ozempic. Patient is currently on an ACE inhibitor/ARB. Patient has seen an ophthalmologist this year. Patient denies any issues with their feet. The symptom started onset as an adult retinopathy and hypertension and hyperlipidemia ARE RELATED TO DM   Hyperlipidemia Patient is coming in for recheck of his hyperlipidemia. The patient is currently taking fish oil and pravastatin. They deny any issues with myalgias or history of liver damage from it. They deny any focal numbness or weakness or chest pain.   Hypertension Patient is currently on lisinopril, and their blood pressure today is 133/77. Patient denies any lightheadedness or dizziness. Patient denies headaches, blurred vision, chest pains, shortness of breath, or weakness. Denies any side effects from medication and is content with current medication.   Relevant past medical, surgical, family and social history reviewed and updated as indicated. Interim medical history since our last visit reviewed. Allergies and medications reviewed and updated.  Review of Systems  Constitutional:  Negative for chills and fever.  Eyes:  Negative for visual disturbance.  Respiratory:  Negative for shortness of breath and wheezing.   Cardiovascular:  Negative for chest pain and leg swelling.  Musculoskeletal:  Negative for back pain and gait problem.  Skin:  Negative for rash.  Neurological:  Negative for dizziness and weakness.  All other systems reviewed and  are negative.   Per HPI unless specifically indicated above   Allergies as of 07/14/2022   No Known Allergies      Medication List        Accurate as of July 14, 2022  4:24 PM. If you have any questions, ask your nurse or doctor.          Accu-Chek Guide Me w/Device Kit USE TO TEST BLOOD SUGAR DAILY AS DIRECTED. DX E11.65   Accu-Chek Guide test strip Generic drug: glucose blood test blood sugar once daily. Dx: E11.65   Accu-Chek Softclix Lancets lancets test blood sugar once daily. Dx: E11.65   aspirin 81 MG tablet Take 81 mg by mouth daily.   calcium carbonate 600 MG Tabs tablet Commonly known as: OS-CAL Take 600 mg by mouth daily with breakfast.   CINNAMON PO Take 1,000 mg by mouth daily.   fish oil-omega-3 fatty acids 1000 MG capsule Take 2 g by mouth daily.   glimepiride 4 MG tablet Commonly known as: AMARYL Take 1 tablet (4 mg total) by mouth daily with breakfast.   lisinopril 20 MG tablet Commonly known as: ZESTRIL Take 1 tablet (20 mg total) by mouth daily.   metFORMIN 1000 MG tablet Commonly known as: GLUCOPHAGE Take 1 tablet (1,000 mg total) by mouth 2 (two) times daily with a meal.   Ozempic (1 MG/DOSE) 2 MG/1.5ML Sopn Generic drug: Semaglutide (1 MG/DOSE) Inject 1 mg into the skin once a week.   pravastatin 80 MG tablet Commonly known as: PRAVACHOL Take 1 tablet (80  mg total) by mouth daily.   SYSTANE HYDRATION PF OP Apply to eye in the morning, at noon, and at bedtime.         Objective:   BP 133/77   Pulse 74   Temp (!) 97.4 F (36.3 C)   Ht _0  (1.702 m)   Wt 159 lb (72.1 kg)   SpO2 96%   BMI 24.90 kg/m   Wt Readings from Last 3 Encounters:  07/14/22 159 lb (72.1 kg)  04/12/22 170 lb (77.1 kg)  01/07/22 164 lb (74.4 kg)    Physical Exam Vitals and nursing note reviewed.  Constitutional:      General: He is not in acute distress.    Appearance: He is well-developed. He is not diaphoretic.  Eyes:      General: No scleral icterus.    Conjunctiva/sclera: Conjunctivae normal.  Neck:     Thyroid: No thyromegaly.  Cardiovascular:     Rate and Rhythm: Normal rate and regular rhythm.     Heart sounds: Normal heart sounds. No murmur heard. Pulmonary:     Effort: Pulmonary effort is normal. No respiratory distress.     Breath sounds: Normal breath sounds. No wheezing.  Musculoskeletal:        General: Normal range of motion.     Cervical back: Neck supple.  Lymphadenopathy:     Cervical: No cervical adenopathy.  Skin:    General: Skin is warm and dry.     Findings: No rash.  Neurological:     Mental Status: He is alert and oriented to person, place, and time.     Coordination: Coordination normal.  Psychiatric:        Behavior: Behavior normal.       Assessment & Plan:   Problem List Items Addressed This Visit       Cardiovascular and Mediastinum   Hypertension associated with diabetes ([place])   Relevant Orders   CBC with Differential/Platelet   CMP14+EGFR   Lipid panel   Bayer DCA Hb A1c Waived     Endocrine   Type 2 diabetes mellitus not at goal [facility])   Mixed hyperlipidemia due to type 2 diabetes mellitus ([place])   Diabetic retinopathy ([place])   Other Visit Diagnoses     Type 2 diabetes mellitus with hyperlipidemia ([place])    -  Primary   Relevant Orders   CBC with Differential/Platelet   CMP14+EGFR   Lipid panel   Bayer DCA Hb A1c Waived       Patient's memory is worsening and I think that he stopped taking his medicines.  His daughter is here with him and we discussed that she is going to take over the medicines. Follow up plan: Return in about 3 months (around 10/14/2022), or if symptoms worsen or fail to improve, for  diabetes recheck.  Counseling provided for all of the vaccine components Orders Placed This Encounter  Procedures   CBC with Differential/Platelet   CMP14+EGFR   Lipid panel   Bayer DCA Hb A1c Waived    Caryl Pina, MD [facility] 07/14/2022, 4:24 PM

## 2022-07-15 ENCOUNTER — Other Ambulatory Visit: Payer: Self-pay

## 2022-07-15 DIAGNOSIS — E1169 Type 2 diabetes mellitus with other specified complication: Secondary | ICD-10-CM

## 2022-07-15 LAB — CBC WITH DIFFERENTIAL/PLATELET
Basophils Absolute: 0 10*3/uL (ref 0.0–0.2)
Basos: 0 %
EOS (ABSOLUTE): 0 10*3/uL (ref 0.0–0.4)
Eos: 0 %
Hematocrit: 42.4 % (ref 37.5–51.0)
Hemoglobin: 13.7 g/dL (ref 13.0–17.7)
Immature Grans (Abs): 0 10*3/uL (ref 0.0–0.1)
Immature Granulocytes: 0 %
Lymphocytes Absolute: 1.7 10*3/uL (ref 0.7–3.1)
Lymphs: 28 %
MCH: 28.3 pg (ref 26.6–33.0)
MCHC: 32.3 g/dL (ref 31.5–35.7)
MCV: 88 fL (ref 79–97)
Monocytes Absolute: 0.4 10*3/uL (ref 0.1–0.9)
Monocytes: 7 %
Neutrophils Absolute: 3.7 10*3/uL (ref 1.4–7.0)
Neutrophils: 65 %
Platelets: 237 10*3/uL (ref 150–450)
RBC: 4.84 x10E6/uL (ref 4.14–5.80)
RDW: 12.2 % (ref 11.6–15.4)
WBC: 5.9 10*3/uL (ref 3.4–10.8)

## 2022-07-15 LAB — LIPID PANEL
Chol/HDL Ratio: 7.6 ratio — ABNORMAL HIGH (ref 0.0–5.0)
Cholesterol, Total: 287 mg/dL — ABNORMAL HIGH (ref 100–199)
HDL: 38 mg/dL — ABNORMAL LOW (ref >39)
LDL Chol Calc (NIH): 192 mg/dL — ABNORMAL HIGH (ref 0–99)
Triglycerides: 291 mg/dL — ABNORMAL HIGH (ref 0–149)
VLDL Cholesterol Cal: 57 mg/dL — ABNORMAL HIGH (ref 5–40)

## 2022-07-15 LAB — CMP14+EGFR
ALT: 17 IU/L (ref 0–44)
AST: 16 IU/L (ref 0–40)
Albumin/Globulin Ratio: 1.8 (ref 1.2–2.2)
Albumin: 4.2 g/dL (ref 3.8–4.8)
Alkaline Phosphatase: 73 IU/L (ref 44–121)
BUN/Creatinine Ratio: 15 (ref 10–24)
BUN: 26 mg/dL (ref 8–27)
Bilirubin Total: 0.2 mg/dL (ref 0.0–1.2)
CO2: 24 mmol/L (ref 20–29)
Calcium: 9.2 mg/dL (ref 8.6–10.2)
Chloride: 98 mmol/L (ref 96–106)
Creatinine, Ser: 1.73 mg/dL — ABNORMAL HIGH (ref 0.76–1.27)
Globulin, Total: 2.4 g/dL (ref 1.5–4.5)
Glucose: 434 mg/dL (ref 70–99)
Potassium: 4.8 mmol/L (ref 3.5–5.2)
Sodium: 135 mmol/L (ref 134–144)
Total Protein: 6.6 g/dL (ref 6.0–8.5)
eGFR: 40 mL/min/{1.73_m2} — ABNORMAL LOW (ref >59)

## 2022-07-15 LAB — MICROALBUMIN / CREATININE URINE RATIO
Creatinine, Urine: 76.2 mg/dL
Microalb/Creat Ratio: 7 mg/g creat (ref 0–29)
Microalbumin, Urine: 5.3 ug/mL

## 2022-07-27 ENCOUNTER — Telehealth: Payer: Self-pay | Admitting: Family Medicine

## 2022-07-27 NOTE — Telephone Encounter (Signed)
Left message informing that we cannot email but they can print from [place]. Informed that a paper copy is up front if they would like to come back and pick up.

## 2022-08-10 ENCOUNTER — Telehealth: Payer: Self-pay | Admitting: Pharmacist

## 2022-08-10 ENCOUNTER — Telehealth: Payer: Medicare PPO

## 2022-08-10 NOTE — Telephone Encounter (Signed)
NOVO RE-ENROLLMENT CAN YOU SEND PATIENT PORTION NOVO [place] AND SEND ME PROVIDER PORTION TO ME TO GET SIGNED: OZEMPIC 1MG  SQ WEEKLY   [place]!

## 2022-08-17 NOTE — Telephone Encounter (Signed)
Mailed novo nordisk re-enrollment application to pt's home.

## 2022-09-02 DIAGNOSIS — H401131 Primary open-angle glaucoma, bilateral, mild stage: Secondary | ICD-10-CM | POA: Diagnosis not present

## 2022-09-06 ENCOUNTER — Telehealth: Payer: Self-pay | Admitting: Family Medicine

## 2022-09-06 DIAGNOSIS — E785 Hyperlipidemia, unspecified: Secondary | ICD-10-CM

## 2022-09-06 DIAGNOSIS — E119 Type 2 diabetes mellitus without complications: Secondary | ICD-10-CM

## 2022-09-06 MED ORDER — OZEMPIC (1 MG/DOSE) 2 MG/1.5ML ~~LOC~~ SOPN: 1.0000 mg | PEN_INJECTOR | SUBCUTANEOUS | 3 refills | Status: DC

## 2022-09-06 NOTE — Telephone Encounter (Signed)
  Prescription Request  09/06/2022  Is this a "Controlled Substance" medicine? Semaglutide, 1 MG/DOSE, (OZEMPIC, 1 MG/DOSE,) 2 MG/1.5ML SOPN   Have you seen your PCP in the last 2 weeks? 10/15/2022  If YES, route message to pool  -  If NO, patient needs to be scheduled for appointment.  What is the name of the medication or equipment? Semaglutide, 1 MG/DOSE, (OZEMPIC, 1 MG/DOSE,) 2 MG/1.5ML SOPN   Have you contacted your pharmacy to request a refill? no   Which pharmacy would you like this sent to? [facility] If a rx cannot be sent in, can pt get samples. Daughter dropped off patient assistance ppw for Raynelle Fanning today.   Patient notified that their request is being sent to the clinical staff for review and that they should receive a response within 2 business days.

## 2022-09-06 NOTE — Telephone Encounter (Signed)
Refills sent to [facility]. Daughter made aware. Daughter states that pt has 2 doses left and requested a sample until Raynelle Fanning can complete the financial assistance paperwork. Informed that we do not have samples at this time. Will send Raynelle Fanning message to make aware.

## 2022-09-07 NOTE — Telephone Encounter (Signed)
Left message with Sherle Poe requesting call back regarding patient assistance re-enrollment for [facility]

## 2022-09-07 NOTE — Telephone Encounter (Signed)
George [name] returned call.  She returned application to clinic on 09/06/22.

## 2022-10-14 DIAGNOSIS — H40023 Open angle with borderline findings, high risk, bilateral: Secondary | ICD-10-CM | POA: Diagnosis not present

## 2022-10-15 ENCOUNTER — Ambulatory Visit (INDEPENDENT_AMBULATORY_CARE_PROVIDER_SITE_OTHER): Payer: Medicare PPO | Admitting: Family Medicine

## 2022-10-15 ENCOUNTER — Encounter: Payer: Self-pay | Admitting: Family Medicine

## 2022-10-15 VITALS — BP 126/69 | HR 65 | Temp 97.3°F | Ht 67.0 in | Wt 157.0 lb

## 2022-10-15 DIAGNOSIS — I152 Hypertension secondary to endocrine disorders: Secondary | ICD-10-CM | POA: Diagnosis not present

## 2022-10-15 DIAGNOSIS — E782 Mixed hyperlipidemia: Secondary | ICD-10-CM | POA: Diagnosis not present

## 2022-10-15 DIAGNOSIS — E1169 Type 2 diabetes mellitus with other specified complication: Secondary | ICD-10-CM | POA: Diagnosis not present

## 2022-10-15 DIAGNOSIS — E785 Hyperlipidemia, unspecified: Secondary | ICD-10-CM | POA: Diagnosis not present

## 2022-10-15 DIAGNOSIS — E1159 Type 2 diabetes mellitus with other circulatory complications: Secondary | ICD-10-CM

## 2022-10-15 DIAGNOSIS — E11319 Type 2 diabetes mellitus with unspecified diabetic retinopathy without macular edema: Secondary | ICD-10-CM | POA: Diagnosis not present

## 2022-10-15 DIAGNOSIS — E119 Type 2 diabetes mellitus without complications: Secondary | ICD-10-CM

## 2022-10-15 LAB — BAYER DCA HB A1C WAIVED: HB A1C (BAYER DCA - WAIVED): 6.6 % — ABNORMAL HIGH (ref 4.8–5.6)

## 2022-10-15 NOTE — Progress Notes (Signed)
BP 126/69   Pulse 65   Temp (!) 97.3 F (36.3 C)   Ht 5\' 7"  (1.702 m)   Wt 157 lb (71.2 kg)   SpO2 98%   BMI 24.59 kg/m    Subjective:   Patient ID: George [name], male    DOB: 10/14/1945, 77 y.o.   MRN: [id number]  HPI: George [name] is a 77 y.o. male presenting on 10/15/2022 for Medical Management of Chronic Issues and Diabetes   HPI Type 2 diabetes mellitus Patient comes in today for recheck of his diabetes. Patient has been currently taking metformin and Ozempic and glipizide. Patient is currently on an ACE inhibitor/ARB. Patient has not seen an ophthalmologist this year. Patient denies any issues with their feet. The symptom started onset as an adult retinopathy ARE RELATED TO DM   Hypertension Patient is currently on lisinopril, and their blood pressure today is 126/69. Patient denies any lightheadedness or dizziness. Patient denies headaches, blurred vision, chest pains, shortness of breath, or weakness. Denies any side effects from medication and is content with current medication.   Hyperlipidemia Patient is coming in for recheck of his hyperlipidemia. The patient is currently taking pravastatin. They deny any issues with myalgias or history of liver damage from it. They deny any focal numbness or weakness or chest pain.   Relevant past medical, surgical, family and social history reviewed and updated as indicated. Interim medical history since our last visit reviewed. Allergies and medications reviewed and updated.  Review of Systems  Constitutional:  Negative for chills and fever.  Eyes:  Negative for visual disturbance.  Respiratory:  Negative for shortness of breath and wheezing.   Cardiovascular:  Negative for chest pain and leg swelling.  Musculoskeletal:  Negative for back pain and gait problem.  Skin:  Negative for rash.  Neurological:  Negative for dizziness, weakness and numbness.  All other systems reviewed and are negative.   Per HPI unless  specifically indicated above   Allergies as of 10/15/2022   No Known Allergies      Medication List        Accurate as of October 15, 2022  3:49 PM. If you have any questions, ask your nurse or doctor.          Accu-Chek Guide Me w/Device Kit USE TO TEST BLOOD SUGAR DAILY AS DIRECTED. DX E11.65   Accu-Chek Guide test strip Generic drug: glucose blood test blood sugar once daily. Dx: E11.65   Accu-Chek Softclix Lancets lancets test blood sugar once daily. Dx: E11.65   aspirin 81 MG tablet Take 81 mg by mouth daily.   calcium carbonate 600 MG Tabs tablet Commonly known as: OS-CAL Take 600 mg by mouth daily with breakfast.   CINNAMON PO Take 1,000 mg by mouth daily.   fish oil-omega-3 fatty acids 1000 MG capsule Take 2 g by mouth daily.   glimepiride 4 MG tablet Commonly known as: AMARYL Take 1 tablet (4 mg total) by mouth daily with breakfast.   lisinopril 20 MG tablet Commonly known as: ZESTRIL Take 1 tablet (20 mg total) by mouth daily.   metFORMIN 1000 MG tablet Commonly known as: GLUCOPHAGE Take 1 tablet (1,000 mg total) by mouth 2 (two) times daily with a meal.   Ozempic (1 MG/DOSE) 2 MG/1.5ML Sopn Generic drug: Semaglutide (1 MG/DOSE) Inject 1 mg into the skin once a week.   pravastatin 80 MG tablet Commonly known as: PRAVACHOL Take 1 tablet (80 mg total) by mouth daily.  SYSTANE HYDRATION PF OP Apply to eye in the morning, at noon, and at bedtime.         Objective:   BP 126/69   Pulse 65   Temp (!) 97.3 F (36.3 C)   Ht 5\' 7"  (1.702 m)   Wt 157 lb (71.2 kg)   SpO2 98%   BMI 24.59 kg/m   Wt Readings from Last 3 Encounters:  10/15/22 157 lb (71.2 kg)  07/14/22 159 lb (72.1 kg)  04/12/22 170 lb (77.1 kg)    Physical Exam Vitals and nursing note reviewed.  Constitutional:      General: He is not in acute distress.    Appearance: He is well-developed. He is not diaphoretic.  Eyes:     General: No scleral icterus.     Conjunctiva/sclera: Conjunctivae normal.  Neck:     Thyroid: No thyromegaly.  Cardiovascular:     Rate and Rhythm: Normal rate and regular rhythm.     Heart sounds: Normal heart sounds. No murmur heard. Pulmonary:     Effort: Pulmonary effort is normal. No respiratory distress.     Breath sounds: Normal breath sounds. No wheezing.  Musculoskeletal:        General: No swelling. Normal range of motion.     Cervical back: Neck supple.  Lymphadenopathy:     Cervical: No cervical adenopathy.  Skin:    General: Skin is warm and dry.     Findings: No rash.  Neurological:     Mental Status: He is alert and oriented to person, place, and time.     Coordination: Coordination normal.  Psychiatric:        Behavior: Behavior normal.       Assessment & Plan:   Problem List Items Addressed This Visit       Cardiovascular and Mediastinum   Hypertension associated with diabetes ([place])   Relevant Orders   BMP8+EGFR   CBC with Differential/Platelet   Lipid panel   Bayer DCA Hb A1c Waived     Endocrine   Type 2 diabetes mellitus not at goal [facility])   Mixed hyperlipidemia due to type 2 diabetes mellitus ([place])   Diabetic retinopathy ([place])   Other Visit Diagnoses     Type 2 diabetes mellitus with hyperlipidemia ([place])    -  Primary   Relevant Orders   BMP8+EGFR   CBC with Differential/Platelet   Lipid panel   Bayer DCA Hb A1c Waived       A1c is 6.6 which is much better than last time, continue current medicine.  He says that his wife says his appetite is down but he says it is actually doing well.  He has lost a little bit of weight but we will keep a close eye on him when he comes back next time if he lost more weight, we may have to back off on the [place] but with his blood sugars being so much better I do not want to change it today currently. Follow up plan: Return in about 3 months (around 01/14/2023), or if symptoms worsen or fail to improve, for Diabetes recheck.  Counseling  provided for all of the vaccine components Orders Placed This Encounter  Procedures   BMP8+EGFR   CBC with Differential/Platelet   Lipid panel   Bayer DCA Hb A1c Waived    Caryl Pina, MD [facility] 10/15/2022, 3:49 PM   Diabetes recheck

## 2022-10-16 LAB — CBC WITH DIFFERENTIAL/PLATELET
Basophils Absolute: 0.1 10*3/uL (ref 0.0–0.2)
Basos: 1 %
EOS (ABSOLUTE): 0.1 10*3/uL (ref 0.0–0.4)
Eos: 1 %
Hematocrit: 40.2 % (ref 37.5–51.0)
Hemoglobin: 13 g/dL (ref 13.0–17.7)
Immature Grans (Abs): 0 10*3/uL (ref 0.0–0.1)
Immature Granulocytes: 0 %
Lymphocytes Absolute: 2.4 10*3/uL (ref 0.7–3.1)
Lymphs: 29 %
MCH: 29.3 pg (ref 26.6–33.0)
MCHC: 32.3 g/dL (ref 31.5–35.7)
MCV: 91 fL (ref 79–97)
Monocytes Absolute: 0.3 10*3/uL (ref 0.1–0.9)
Monocytes: 4 %
Neutrophils Absolute: 5.2 10*3/uL (ref 1.4–7.0)
Neutrophils: 65 %
Platelets: 286 10*3/uL (ref 150–450)
RBC: 4.44 x10E6/uL (ref 4.14–5.80)
RDW: 13.2 % (ref 11.6–15.4)
WBC: 8.1 10*3/uL (ref 3.4–10.8)

## 2022-10-16 LAB — LIPID PANEL
Chol/HDL Ratio: 3.1 ratio (ref 0.0–5.0)
Cholesterol, Total: 153 mg/dL (ref 100–199)
HDL: 49 mg/dL (ref >39)
LDL Chol Calc (NIH): 87 mg/dL (ref 0–99)
Triglycerides: 90 mg/dL (ref 0–149)
VLDL Cholesterol Cal: 17 mg/dL (ref 5–40)

## 2022-10-16 LAB — BMP8+EGFR
BUN/Creatinine Ratio: 12 (ref 10–24)
BUN: 12 mg/dL (ref 8–27)
CO2: 23 mmol/L (ref 20–29)
Calcium: 10.1 mg/dL (ref 8.6–10.2)
Chloride: 98 mmol/L (ref 96–106)
Creatinine, Ser: 0.98 mg/dL (ref 0.76–1.27)
Glucose: 66 mg/dL — ABNORMAL LOW (ref 70–99)
Potassium: 4.9 mmol/L (ref 3.5–5.2)
Sodium: 137 mmol/L (ref 134–144)
eGFR: 79 mL/min/{1.73_m2} (ref >59)

## 2022-10-21 NOTE — Telephone Encounter (Signed)
Application submitted 88/89/16. Novo nordisk needs proof of income.  Spoke with patient. He first said he had no income, then he changed to say he has [employer]... once I mentioned we would need proof to submit to the company, he hung up on me. I attempted to call back but he didn't answer.

## 2022-10-27 ENCOUNTER — Telehealth: Payer: Self-pay | Admitting: Family Medicine

## 2022-10-27 NOTE — Telephone Encounter (Signed)
Daughter made aware. State that pt and his wife do not get tax returns but does have social security benefit documentation. Daughter will bring by on 1/25 for [place].

## 2022-10-29 ENCOUNTER — Telehealth: Payer: Self-pay

## 2022-10-29 NOTE — Telephone Encounter (Signed)
Patient aware that Ozempic has been received and placed in refrigerator for pick up.

## 2022-12-21 ENCOUNTER — Ambulatory Visit (INDEPENDENT_AMBULATORY_CARE_PROVIDER_SITE_OTHER): Payer: Medicare PPO

## 2022-12-21 VITALS — Ht 68.0 in | Wt 157.0 lb

## 2022-12-21 DIAGNOSIS — Z0000 Encounter for general adult medical examination without abnormal findings: Secondary | ICD-10-CM

## 2022-12-21 NOTE — Patient Instructions (Signed)
George [name] , Thank you for taking time to come for your Medicare Wellness Visit. I appreciate your ongoing commitment to your health goals. Please review the following plan we discussed and let me know if I can assist you in the future.   These are the goals we discussed:  Goals       Exercise 150 min/wk Moderate Activity      Have 3 meals a day      T2DM (pt-stated)      Current Barriers:  Unable to independently afford treatment regimen Unable to maintain control of T2DM   Pharmacist Clinical Goal(s):  Over the next 90 days, patient will verbalize ability to afford treatment regimen maintain control of T2DM as evidenced by GOAL A1C<7%, IMPROVED GLYCEMIC CONTROL  through collaboration with PharmD and provider.    Interventions: 1:1 collaboration with Dettinger, Fransisca Kaufmann, MD regarding development and update of comprehensive plan of care as evidenced by provider attestation and co-signature Inter-disciplinary care team collaboration (see longitudinal plan of care) Comprehensive medication review performed; medication list updated in electronic medical record  Diabetes: Uncontrolled; current treatment:OZEMPIC 1MG  SQ WEEKLY (ran out due to cost); METFORMIN, GLIMEPIRIDE  Denies personal and family history of Medullary thyroid cancer (MTC) A1C HAS INCREASED WITHOUT OZEMPIC THERAPY WILL LOOK TO D/C GLIMEPIRIDE WHEN OZEMPIC SUPPLY ARRIVES GFR 69, A1C 8.8 05/01/21--complaints of n/v, may be with ozempic patient's wife is unsure He is out of ozempic this week (last shot last Friday) Samples of tradjenta given to bridge gap to ozempic-->may have to switch ozempic to another agent or decrease dose if not tolerated at 1mg  Appt scheduled with patient & wife next week Current glucose readings: fasting glucose: NOT CHECKING, post prandial glucose: N/A Denies hypoglycemic/hyperglycemic symptoms Discussed meal planning options and Plate method for healthy eating Avoid sugary drinks and  desserts Incorporate balanced protein, non starchy veggies, 1 serving of carbohydrate with each meal Increase water intake Increase physical activity as able Current exercise: N/A Educated on OZEMPIC, DIET, MEDICATIONS Assessed patient finances. PATIENT APPROVED FOR OZEMPIC PATIENT ASSISTANCE VIA [facility] PATIENT ASSISTANCE PROGRAM-SUPPLY WILL SHIP IN 10-14 BUSINESS DAYS TO PCP OFFICE, PATIENT AWARE   Patient Goals/Self-Care Activities Over the next 90 days, patient will:  - take medications as prescribed check glucose 3 times weekly, document, and provide at future appointments  Follow Up Plan: Telephone follow up appointment with care management team member scheduled for: 1 month         This is a list of the screening recommended for you and due dates:  Health Maintenance  Topic Date Due   COVID-19 Vaccine (3 - 2023-24 season) 06/04/2022   Eye exam for diabetics  03/03/2023   Complete foot exam   04/13/2023   Hemoglobin A1C  04/15/2023   Yearly kidney health urinalysis for diabetes  07/15/2023   Yearly kidney function blood test for diabetes  10/16/2023   Medicare Annual Wellness Visit  12/21/2023   DTaP/Tdap/Td vaccine (2 - Td or Tdap) 05/31/2025   Pneumonia Vaccine  Completed   Flu Shot  Completed   Hepatitis C Screening: USPSTF Recommendation to screen - Ages 54-79 yo.  Completed   Zoster (Shingles) Vaccine  Completed   HPV Vaccine  Aged Out   Colon Cancer Screening  Discontinued    Advanced directives: Advance directive discussed with you today. I have provided a copy for you to complete at home and have notarized. Once this is complete please bring a copy in to our office so we  can scan it into your chart.   Conditions/risks identified: Aim for 30 minutes of exercise or brisk walking, 6-8 glasses of water, and 5 servings of fruits and vegetables each day.   Next appointment: Follow up in one year for your annual wellness visit.   Preventive Care 39 Years and  Older, Male  Preventive care refers to lifestyle choices and visits with your health care provider that can promote health and wellness. What does preventive care include? A yearly physical exam. This is also called an annual well check. Dental exams once or twice a year. Routine eye exams. Ask your health care provider how often you should have your eyes checked. Personal lifestyle choices, including: Daily care of your teeth and gums. Regular physical activity. Eating a healthy diet. Avoiding tobacco and drug use. Limiting alcohol use. Practicing safe sex. Taking low doses of aspirin every day. Taking vitamin and mineral supplements as recommended by your health care provider. What happens during an annual well check? The services and screenings done by your health care provider during your annual well check will depend on your age, overall health, lifestyle risk factors, and family history of disease. Counseling  Your health care provider may ask you questions about your: Alcohol use. Tobacco use. Drug use. Emotional well-being. Home and relationship well-being. Sexual activity. Eating habits. History of falls. Memory and ability to understand (cognition). Work and work Statistician. Screening  You may have the following tests or measurements: Height, weight, and BMI. Blood pressure. Lipid and cholesterol levels. These may be checked every 5 years, or more frequently if you are over 2 years old. Skin check. Lung cancer screening. You may have this screening every year starting at age 29 if you have a 30-pack-year history of smoking and currently smoke or have quit within the past 15 years. Fecal occult blood test (FOBT) of the stool. You may have this test every year starting at age 48. Flexible sigmoidoscopy or colonoscopy. You may have a sigmoidoscopy every 5 years or a colonoscopy every 10 years starting at age 41. Prostate cancer screening. Recommendations will vary  depending on your family history and other risks. Hepatitis C blood test. Hepatitis B blood test. Sexually transmitted disease (STD) testing. Diabetes screening. This is done by checking your blood sugar (glucose) after you have not eaten for a while (fasting). You may have this done every 1-3 years. Abdominal aortic aneurysm (AAA) screening. You may need this if you are a current or former smoker. Osteoporosis. You may be screened starting at age 8 if you are at high risk. Talk with your health care provider about your test results, treatment options, and if necessary, the need for more tests. Vaccines  Your health care provider may recommend certain vaccines, such as: Influenza vaccine. This is recommended every year. Tetanus, diphtheria, and acellular pertussis (Tdap, Td) vaccine. You may need a Td booster every 10 years. Zoster vaccine. You may need this after age 50. Pneumococcal 13-valent conjugate (PCV13) vaccine. One dose is recommended after age 54. Pneumococcal polysaccharide (PPSV23) vaccine. One dose is recommended after age 107. Talk to your health care provider about which screenings and vaccines you need and how often you need them. This information is not intended to replace advice given to you by your health care provider. Make sure you discuss any questions you have with your health care provider. Document Released: 10/17/2015 Document Revised: 06/09/2016 Document Reviewed: 07/22/2015 Elsevier Interactive Patient Education  2017 [place] Prevention in  the Home Falls can cause injuries. They can happen to people of all ages. There are many things you can do to make your home safe and to help prevent falls. What can I do on the outside of my home? Regularly fix the edges of walkways and driveways and fix any cracks. Remove anything that might make you trip as you walk through a door, such as a raised step or threshold. Trim any bushes or trees on the path to your  home. Use bright outdoor lighting. Clear any walking paths of anything that might make someone trip, such as rocks or tools. Regularly check to see if handrails are loose or broken. Make sure that both sides of any steps have handrails. Any raised decks and porches should have guardrails on the edges. Have any leaves, snow, or ice cleared regularly. Use sand or salt on walking paths during winter. Clean up any spills in your garage right away. This includes oil or grease spills. What can I do in the bathroom? Use night lights. Install grab bars by the toilet and in the tub and shower. Do not use towel bars as grab bars. Use non-skid mats or decals in the tub or shower. If you need to sit down in the shower, use a plastic, non-slip stool. Keep the floor dry. Clean up any water that spills on the floor as soon as it happens. Remove soap buildup in the tub or shower regularly. Attach bath mats securely with double-sided non-slip rug tape. Do not have throw rugs and other things on the floor that can make you trip. What can I do in the bedroom? Use night lights. Make sure that you have a light by your bed that is easy to reach. Do not use any sheets or blankets that are too big for your bed. They should not hang down onto the floor. Have a firm chair that has side arms. You can use this for support while you get dressed. Do not have throw rugs and other things on the floor that can make you trip. What can I do in the kitchen? Clean up any spills right away. Avoid walking on wet floors. Keep items that you use a lot in easy-to-reach places. If you need to reach something above you, use a strong step stool that has a grab bar. Keep electrical cords out of the way. Do not use floor polish or wax that makes floors slippery. If you must use wax, use non-skid floor wax. Do not have throw rugs and other things on the floor that can make you trip. What can I do with my stairs? Do not leave any  items on the stairs. Make sure that there are handrails on both sides of the stairs and use them. Fix handrails that are broken or loose. Make sure that handrails are as long as the stairways. Check any carpeting to make sure that it is firmly attached to the stairs. Fix any carpet that is loose or worn. Avoid having throw rugs at the top or bottom of the stairs. If you do have throw rugs, attach them to the floor with carpet tape. Make sure that you have a light switch at the top of the stairs and the bottom of the stairs. If you do not have them, ask someone to add them for you. What else can I do to help prevent falls? Wear shoes that: Do not have high heels. Have rubber bottoms. Are comfortable and fit you well. Are  closed at the toe. Do not wear sandals. If you use a stepladder: Make sure that it is fully opened. Do not climb a closed stepladder. Make sure that both sides of the stepladder are locked into place. Ask someone to hold it for you, if possible. Clearly mark and make sure that you can see: Any grab bars or handrails. First and last steps. Where the edge of each step is. Use tools that help you move around (mobility aids) if they are needed. These include: Canes. Walkers. Scooters. Crutches. Turn on the lights when you go into a dark area. Replace any light bulbs as soon as they burn out. Set up your furniture so you have a clear path. Avoid moving your furniture around. If any of your floors are uneven, fix them. If there are any pets around you, be aware of where they are. Review your medicines with your doctor. Some medicines can make you feel dizzy. This can increase your chance of falling. Ask your doctor what other things that you can do to help prevent falls. This information is not intended to replace advice given to you by your health care provider. Make sure you discuss any questions you have with your health care provider. Document Released: 07/17/2009  Document Revised: 02/26/2016 Document Reviewed: 10/25/2014 Elsevier Interactive Patient Education  2017 [employer].
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
## 2022-12-28 NOTE — Telephone Encounter (Signed)
Received notification from [place] regarding approval for George [name]. Patient assistance approved until 10/04/23.

## 2023-01-14 ENCOUNTER — Ambulatory Visit (INDEPENDENT_AMBULATORY_CARE_PROVIDER_SITE_OTHER): Payer: Medicare PPO | Admitting: Family Medicine

## 2023-01-14 ENCOUNTER — Encounter: Payer: Self-pay | Admitting: Family Medicine

## 2023-01-14 VITALS — BP 138/71 | HR 87 | Ht 68.0 in | Wt 160.0 lb

## 2023-01-14 DIAGNOSIS — I152 Hypertension secondary to endocrine disorders: Secondary | ICD-10-CM | POA: Diagnosis not present

## 2023-01-14 DIAGNOSIS — E1169 Type 2 diabetes mellitus with other specified complication: Secondary | ICD-10-CM

## 2023-01-14 DIAGNOSIS — Z7984 Long term (current) use of oral hypoglycemic drugs: Secondary | ICD-10-CM | POA: Diagnosis not present

## 2023-01-14 DIAGNOSIS — E1159 Type 2 diabetes mellitus with other circulatory complications: Secondary | ICD-10-CM | POA: Diagnosis not present

## 2023-01-14 DIAGNOSIS — E785 Hyperlipidemia, unspecified: Secondary | ICD-10-CM

## 2023-01-14 DIAGNOSIS — E119 Type 2 diabetes mellitus without complications: Secondary | ICD-10-CM

## 2023-01-14 DIAGNOSIS — E782 Mixed hyperlipidemia: Secondary | ICD-10-CM | POA: Diagnosis not present

## 2023-01-14 LAB — BAYER DCA HB A1C WAIVED: HB A1C (BAYER DCA - WAIVED): 5.9 % — ABNORMAL HIGH (ref 4.8–5.6)

## 2023-01-14 MED ORDER — GLIMEPIRIDE 4 MG PO TABS: 4.0000 mg | ORAL_TABLET | Freq: Every day | ORAL | 3 refills | Status: DC

## 2023-01-14 MED ORDER — PRAVASTATIN SODIUM 80 MG PO TABS: 80.0000 mg | ORAL_TABLET | Freq: Every day | ORAL | 3 refills | Status: DC

## 2023-01-14 MED ORDER — METFORMIN HCL 1000 MG PO TABS: 1000.0000 mg | ORAL_TABLET | Freq: Two times a day (BID) | ORAL | 3 refills | Status: DC

## 2023-01-14 MED ORDER — LISINOPRIL 20 MG PO TABS: 20.0000 mg | ORAL_TABLET | Freq: Every day | ORAL | 3 refills | Status: DC

## 2023-01-14 NOTE — Progress Notes (Signed)
BP 138/71   Pulse 87   Ht  (1.727 m)   Wt 160 lb (72.6 kg)   SpO2 100%   BMI 24.33 kg/m    Subjective:   Patient ID: George [name], male    DOB: 04/28/1946, 77 y.o.   MRN: [id number]  HPI: George [name] is a 77 y.o. male presenting on 01/14/2023 for Medical Management of Chronic Issues and Diabetes   HPI Type 2 diabetes mellitus Patient comes in today for recheck of his diabetes. Patient has been currently taking Ozempic and glimepiride and metformin. Patient is currently on an ACE inhibitor/ARB. Patient has not seen an ophthalmologist this year. Patient denies any new issues with their feet. The symptom started onset as an adult retinopathy and hypertension and hyperlipidemia ARE RELATED TO DM   Hypertension Patient is currently on lisinopril, and their blood pressure today is 138/71. Patient denies any lightheadedness or dizziness. Patient denies headaches, blurred vision, chest pains, shortness of breath, or weakness. Denies any side effects from medication and is content with current medication.   Hyperlipidemia Patient is coming in for recheck of his hyperlipidemia. The patient is currently taking fish oil and pravastatin. They deny any issues with myalgias or history of liver damage from it. They deny any focal numbness or weakness or chest pain.   Relevant past medical, surgical, family and social history reviewed and updated as indicated. Interim medical history since our last visit reviewed. Allergies and medications reviewed and updated.  Review of Systems  Constitutional:  Negative for chills and fever.  Eyes:  Negative for visual disturbance.  Respiratory:  Negative for shortness of breath and wheezing.   Cardiovascular:  Negative for chest pain and leg swelling.  Musculoskeletal:  Negative for back pain and gait problem.  Skin:  Negative for rash.  Neurological:  Negative for dizziness and light-headedness.  All other systems reviewed and are  negative.   Per HPI unless specifically indicated above   Allergies as of 01/14/2023   No Known Allergies      Medication List        Accurate as of January 14, 2023  4:05 PM. If you have any questions, ask your nurse or doctor.          Accu-Chek Guide Me w/Device Kit USE TO TEST BLOOD SUGAR DAILY AS DIRECTED. DX E11.65   Accu-Chek Guide test strip Generic drug: glucose blood test blood sugar once daily. Dx: E11.65   Accu-Chek Softclix Lancets lancets test blood sugar once daily. Dx: E11.65   aspirin 81 MG tablet Take 81 mg by mouth daily.   calcium carbonate 600 MG Tabs tablet Commonly known as: OS-CAL Take 600 mg by mouth daily with breakfast.   CINNAMON PO Take 1,000 mg by mouth daily.   fish oil-omega-3 fatty acids 1000 MG capsule Take 2 g by mouth daily.   glimepiride 4 MG tablet Commonly known as: AMARYL Take 1 tablet (4 mg total) by mouth daily with breakfast.   lisinopril 20 MG tablet Commonly known as: ZESTRIL Take 1 tablet (20 mg total) by mouth daily.   metFORMIN 1000 MG tablet Commonly known as: GLUCOPHAGE Take 1 tablet (1,000 mg total) by mouth 2 (two) times daily with a meal.   Ozempic (1 MG/DOSE) 2 MG/1.5ML Sopn Generic drug: Semaglutide (1 MG/DOSE) Inject 1 mg into the skin once a week.   pravastatin 80 MG tablet Commonly known as: PRAVACHOL Take 1 tablet (80 mg total) by mouth daily.  SYSTANE HYDRATION PF OP Apply to eye in the morning, at noon, and at bedtime.         Objective:   BP 138/71   Pulse 87   Ht 5\' 8"  (1.727 m)   Wt 160 lb (72.6 kg)   SpO2 100%   BMI 24.33 kg/m   Wt Readings from Last 3 Encounters:  01/14/23 160 lb (72.6 kg)  12/21/22 157 lb (71.2 kg)  10/15/22 157 lb (71.2 kg)    Physical Exam Vitals and nursing note reviewed.  Constitutional:      General: He is not in acute distress.    Appearance: He is well-developed. He is not diaphoretic.  Eyes:     General: No scleral icterus.     Conjunctiva/sclera: Conjunctivae normal.  Neck:     Thyroid: No thyromegaly.  Cardiovascular:     Rate and Rhythm: Normal rate and regular rhythm.     Heart sounds: Normal heart sounds. No murmur heard. Pulmonary:     Effort: Pulmonary effort is normal. No respiratory distress.     Breath sounds: Normal breath sounds. No wheezing.  Musculoskeletal:        General: No swelling. Normal range of motion.     Cervical back: Neck supple.  Lymphadenopathy:     Cervical: No cervical adenopathy.  Skin:    General: Skin is warm and dry.     Findings: No rash.  Neurological:     Mental Status: He is alert and oriented to person, place, and time.     Coordination: Coordination normal.  Psychiatric:        Behavior: Behavior normal.     Assessment & Plan:   Problem List Items Addressed This Visit       Cardiovascular and Mediastinum   Hypertension associated with diabetes   Relevant Medications   metFORMIN (GLUCOPHAGE) 1000 MG tablet   lisinopril (ZESTRIL) 20 MG tablet   pravastatin (PRAVACHOL) 80 MG tablet   glimepiride (AMARYL) 4 MG tablet     Endocrine   Type 2 diabetes mellitus not at goal [facility])   Relevant Medications   metFORMIN (GLUCOPHAGE) 1000 MG tablet   lisinopril (ZESTRIL) 20 MG tablet   pravastatin (PRAVACHOL) 80 MG tablet   glimepiride (AMARYL) 4 MG tablet   Mixed hyperlipidemia due to type 2 diabetes mellitus (HCC)   Relevant Medications   metFORMIN (GLUCOPHAGE) 1000 MG tablet   lisinopril (ZESTRIL) 20 MG tablet   pravastatin (PRAVACHOL) 80 MG tablet   glimepiride (AMARYL) 4 MG tablet   Other Visit Diagnoses     Type 2 diabetes mellitus without complication, without long-term current use of insulin    -  Primary   Relevant Medications   metFORMIN (GLUCOPHAGE) 1000 MG tablet   lisinopril (ZESTRIL) 20 MG tablet   pravastatin (PRAVACHOL) 80 MG tablet   glimepiride (AMARYL) 4 MG tablet   Other Relevant Orders   Bayer DCA Hb A1c Waived   Type 2 diabetes  mellitus with hyperlipidemia       Relevant Medications   metFORMIN (GLUCOPHAGE) 1000 MG tablet   lisinopril (ZESTRIL) 20 MG tablet   pravastatin (PRAVACHOL) 80 MG tablet   glimepiride (AMARYL) 4 MG tablet       A1c looks good at 5.9.  Blood pressure and everything else looks good, no changes. Follow up plan: Return in about 3 months (around 04/15/2023), or if symptoms worsen or fail to improve, for Diabetes hypertension and cholesterol recheck..  Counseling provided for all of  the vaccine components Orders Placed This Encounter  Procedures   Bayer DCA Hb A1c Waived    Arville Care, MD [facility] 01/14/2023, 4:05 PM

## 2023-01-27 ENCOUNTER — Telehealth: Payer: Self-pay

## 2023-01-27 NOTE — Telephone Encounter (Signed)
Called pt , let him know that his patient assistance has came in and is ready

## 2023-04-14 DIAGNOSIS — H40023 Open angle with borderline findings, high risk, bilateral: Secondary | ICD-10-CM | POA: Diagnosis not present

## 2023-04-15 ENCOUNTER — Ambulatory Visit (INDEPENDENT_AMBULATORY_CARE_PROVIDER_SITE_OTHER): Payer: Medicare PPO | Admitting: Family Medicine

## 2023-04-15 ENCOUNTER — Encounter: Payer: Self-pay | Admitting: Family Medicine

## 2023-04-15 VITALS — BP 134/79 | HR 70 | Ht 68.0 in | Wt 159.0 lb

## 2023-04-15 DIAGNOSIS — Z7985 Long-term (current) use of injectable non-insulin antidiabetic drugs: Secondary | ICD-10-CM | POA: Diagnosis not present

## 2023-04-15 DIAGNOSIS — E1159 Type 2 diabetes mellitus with other circulatory complications: Secondary | ICD-10-CM

## 2023-04-15 DIAGNOSIS — E1169 Type 2 diabetes mellitus with other specified complication: Secondary | ICD-10-CM | POA: Diagnosis not present

## 2023-04-15 DIAGNOSIS — Z7984 Long term (current) use of oral hypoglycemic drugs: Secondary | ICD-10-CM | POA: Diagnosis not present

## 2023-04-15 DIAGNOSIS — E785 Hyperlipidemia, unspecified: Secondary | ICD-10-CM

## 2023-04-15 DIAGNOSIS — I152 Hypertension secondary to endocrine disorders: Secondary | ICD-10-CM

## 2023-04-15 DIAGNOSIS — E11319 Type 2 diabetes mellitus with unspecified diabetic retinopathy without macular edema: Secondary | ICD-10-CM

## 2023-04-15 DIAGNOSIS — E119 Type 2 diabetes mellitus without complications: Secondary | ICD-10-CM

## 2023-04-15 LAB — BAYER DCA HB A1C WAIVED: HB A1C (BAYER DCA - WAIVED): 6.2 % — ABNORMAL HIGH (ref 4.8–5.6)

## 2023-04-15 NOTE — Progress Notes (Signed)
BP 134/79   Pulse 70   Ht 5\' 8"  (1.727 m)   Wt 159 lb (72.1 kg)   SpO2 97%   BMI 24.18 kg/m    Subjective:   Patient ID: George [name], male    DOB: [birth year], 77 y.o.   MRN: [id number]  HPI: George [name] is a 77 y.o. male presenting on 04/15/2023 for Medical Management of Chronic Issues and Diabetes   HPI Type 2 diabetes mellitus Patient comes in today for recheck of his diabetes. Patient has been currently taking metformin and Ozempic. Patient is currently on an ACE inhibitor/ARB. Patient has not seen an ophthalmologist this year. Patient is denies any new issues with their feet. The symptom started onset as an adult hypertension and hyperlipidemia ARE RELATED TO DM   Hyperlipidemia Patient is coming in for recheck of his hyperlipidemia. The patient is currently taking pravastatin. They deny any issues with myalgias or history of liver damage from it. They deny any focal numbness or weakness or chest pain.   Hypertension Patient is currently on lisinopril, and their blood pressure today is 134/79. Patient denies any lightheadedness or dizziness. Patient denies headaches, blurred vision, chest pains, shortness of breath, or weakness. Denies any side effects from medication and is content with current medication.   Relevant past medical, surgical, family and social history reviewed and updated as indicated. Interim medical history since our last visit reviewed. Allergies and medications reviewed and updated.  Review of Systems  Constitutional:  Negative for chills and fever.  Eyes:  Negative for visual disturbance.  Respiratory:  Negative for shortness of breath and wheezing.   Cardiovascular:  Negative for chest pain and leg swelling.  Musculoskeletal:  Negative for back pain and gait problem.  Skin:  Negative for rash.  All other systems reviewed and are negative.   Per HPI unless specifically indicated above   Allergies as of 04/15/2023   No Known Allergies       Medication List        Accurate as of April 15, 2023  4:24 PM. If you have any questions, ask your nurse or doctor.          Accu-Chek Guide Me w/Device Kit USE TO TEST BLOOD SUGAR DAILY AS DIRECTED. DX E11.65   Accu-Chek Guide test strip Generic drug: glucose blood test blood sugar once daily. Dx: E11.65   Accu-Chek Softclix Lancets lancets test blood sugar once daily. Dx: E11.65   aspirin 81 MG tablet Take 81 mg by mouth daily.   calcium carbonate 600 MG Tabs tablet Commonly known as: OS-CAL Take 600 mg by mouth daily with breakfast.   CINNAMON PO Take 1,000 mg by mouth daily.   fish oil-omega-3 fatty acids 1000 MG capsule Take 2 g by mouth daily.   glimepiride 4 MG tablet Commonly known as: AMARYL Take 1 tablet (4 mg total) by mouth daily with breakfast.   lisinopril 20 MG tablet Commonly known as: ZESTRIL Take 1 tablet (20 mg total) by mouth daily.   metFORMIN 1000 MG tablet Commonly known as: GLUCOPHAGE Take 1 tablet (1,000 mg total) by mouth 2 (two) times daily with a meal.   Ozempic (1 MG/DOSE) 2 MG/1.5ML Sopn Generic drug: Semaglutide (1 MG/DOSE) Inject 1 mg into the skin once a week.   pravastatin 80 MG tablet Commonly known as: PRAVACHOL Take 1 tablet (80 mg total) by mouth daily.   SYSTANE HYDRATION PF OP Apply to eye in the morning, at noon, and  at bedtime.         Objective:   BP 134/79   Pulse 70   Ht 5\' 8"  (1.727 m)   Wt 159 lb (72.1 kg)   SpO2 97%   BMI 24.18 kg/m   Wt Readings from Last 3 Encounters:  04/15/23 159 lb (72.1 kg)  01/14/23 160 lb (72.6 kg)  12/21/22 157 lb (71.2 kg)    Physical Exam Vitals and nursing note reviewed.  Constitutional:      General: He is not in acute distress.    Appearance: He is well-developed. He is not diaphoretic.  Eyes:     General: No scleral icterus.       Right eye: No discharge.     Conjunctiva/sclera: Conjunctivae normal.     Pupils: Pupils are equal, round, and reactive  to light.  Neck:     Thyroid: No thyromegaly.  Cardiovascular:     Rate and Rhythm: Normal rate and regular rhythm.     Heart sounds: Normal heart sounds. No murmur heard. Pulmonary:     Effort: Pulmonary effort is normal. No respiratory distress.     Breath sounds: Normal breath sounds. No wheezing.  Musculoskeletal:        General: Normal range of motion.     Cervical back: Neck supple.  Lymphadenopathy:     Cervical: No cervical adenopathy.  Skin:    General: Skin is warm and dry.     Findings: No rash.  Neurological:     Mental Status: He is alert and oriented to person, place, and time.     Coordination: Coordination normal.  Psychiatric:        Behavior: Behavior normal.       Assessment & Plan:   Problem List Items Addressed This Visit       Cardiovascular and Mediastinum   Hypertension associated with diabetes (HCC)     Endocrine   Type 2 diabetes mellitus not at goal [facility])   Mixed hyperlipidemia due to type 2 diabetes mellitus (HCC)   Diabetic retinopathy (HCC)   Other Visit Diagnoses     Type 2 diabetes mellitus with hyperlipidemia (HCC)    -  Primary   Relevant Orders   CBC with Differential/Platelet   CMP14+EGFR   Lipid panel   Bayer DCA Hb A1c Waived       A1c 6.2, looks good, no changes.  He seems to be doing well. Follow up plan: Return in about 3 months (around 07/16/2023), or if symptoms worsen or fail to improve, for diabetes and hypertension.  Counseling provided for all of the vaccine components Orders Placed This Encounter  Procedures   CBC with Differential/Platelet   CMP14+EGFR   Lipid panel   Bayer DCA Hb A1c Waived    Arville Care, MD [facility] 04/15/2023, 4:24 PM

## 2023-04-16 LAB — CBC WITH DIFFERENTIAL/PLATELET
Basophils Absolute: 0 10*3/uL (ref 0.0–0.2)
Basos: 1 %
EOS (ABSOLUTE): 0.1 10*3/uL (ref 0.0–0.4)
Eos: 1 %
Hematocrit: 38.3 % (ref 37.5–51.0)
Hemoglobin: 12.7 g/dL — ABNORMAL LOW (ref 13.0–17.7)
Immature Grans (Abs): 0 10*3/uL (ref 0.0–0.1)
Immature Granulocytes: 0 %
Lymphocytes Absolute: 2.2 10*3/uL (ref 0.7–3.1)
Lymphs: 32 %
MCH: 28.8 pg (ref 26.6–33.0)
MCHC: 33.2 g/dL (ref 31.5–35.7)
MCV: 87 fL (ref 79–97)
Monocytes Absolute: 0.4 10*3/uL (ref 0.1–0.9)
Monocytes: 5 %
Neutrophils Absolute: 4.3 10*3/uL (ref 1.4–7.0)
Neutrophils: 61 %
Platelets: 231 10*3/uL (ref 150–450)
RBC: 4.41 x10E6/uL (ref 4.14–5.80)
RDW: 13.3 % (ref 11.6–15.4)
WBC: 7 10*3/uL (ref 3.4–10.8)

## 2023-04-16 LAB — CMP14+EGFR
ALT: 17 IU/L (ref 0–44)
AST: 16 IU/L (ref 0–40)
Albumin: 4.2 g/dL (ref 3.8–4.8)
Alkaline Phosphatase: 56 IU/L (ref 44–121)
BUN/Creatinine Ratio: 25 — ABNORMAL HIGH (ref 10–24)
BUN: 31 mg/dL — ABNORMAL HIGH (ref 8–27)
Bilirubin Total: 0.4 mg/dL (ref 0.0–1.2)
CO2: 23 mmol/L (ref 20–29)
Calcium: 9.7 mg/dL (ref 8.6–10.2)
Chloride: 99 mmol/L (ref 96–106)
Creatinine, Ser: 1.23 mg/dL (ref 0.76–1.27)
Globulin, Total: 2.4 g/dL (ref 1.5–4.5)
Glucose: 103 mg/dL — ABNORMAL HIGH (ref 70–99)
Potassium: 5 mmol/L (ref 3.5–5.2)
Sodium: 137 mmol/L (ref 134–144)
Total Protein: 6.6 g/dL (ref 6.0–8.5)
eGFR: 60 mL/min/{1.73_m2} (ref >59)

## 2023-04-16 LAB — LIPID PANEL
Chol/HDL Ratio: 3.3 ratio (ref 0.0–5.0)
Cholesterol, Total: 156 mg/dL (ref 100–199)
HDL: 48 mg/dL (ref >39)
LDL Chol Calc (NIH): 92 mg/dL (ref 0–99)
Triglycerides: 83 mg/dL (ref 0–149)
VLDL Cholesterol Cal: 16 mg/dL (ref 5–40)

## 2023-05-04 ENCOUNTER — Telehealth: Payer: Self-pay

## 2023-05-04 NOTE — Telephone Encounter (Signed)
Patient's wife aware we have received his patient assistance medication, Ozempic, and it will be in the refrigerator for them to come pick up.

## 2023-05-11 ENCOUNTER — Other Ambulatory Visit: Payer: Self-pay | Admitting: Family Medicine

## 2023-05-11 DIAGNOSIS — I152 Hypertension secondary to endocrine disorders: Secondary | ICD-10-CM

## 2023-05-11 DIAGNOSIS — E1169 Type 2 diabetes mellitus with other specified complication: Secondary | ICD-10-CM

## 2023-05-11 DIAGNOSIS — E119 Type 2 diabetes mellitus without complications: Secondary | ICD-10-CM

## 2023-06-28 ENCOUNTER — Other Ambulatory Visit: Payer: Self-pay | Admitting: Family Medicine

## 2023-06-28 DIAGNOSIS — E1169 Type 2 diabetes mellitus with other specified complication: Secondary | ICD-10-CM

## 2023-06-28 DIAGNOSIS — E119 Type 2 diabetes mellitus without complications: Secondary | ICD-10-CM

## 2023-07-18 ENCOUNTER — Ambulatory Visit: Payer: Medicare PPO | Admitting: Family Medicine

## 2023-07-21 ENCOUNTER — Other Ambulatory Visit: Payer: Self-pay | Admitting: Family Medicine

## 2023-07-21 ENCOUNTER — Encounter: Payer: Self-pay | Admitting: Family Medicine

## 2023-07-21 DIAGNOSIS — E119 Type 2 diabetes mellitus without complications: Secondary | ICD-10-CM

## 2023-08-09 ENCOUNTER — Other Ambulatory Visit: Payer: Self-pay | Admitting: Family Medicine

## 2023-08-09 DIAGNOSIS — E119 Type 2 diabetes mellitus without complications: Secondary | ICD-10-CM

## 2023-08-09 DIAGNOSIS — E1159 Type 2 diabetes mellitus with other circulatory complications: Secondary | ICD-10-CM

## 2023-08-09 DIAGNOSIS — E1169 Type 2 diabetes mellitus with other specified complication: Secondary | ICD-10-CM

## 2023-08-26 ENCOUNTER — Other Ambulatory Visit: Payer: Self-pay | Admitting: Family Medicine

## 2023-08-26 DIAGNOSIS — E119 Type 2 diabetes mellitus without complications: Secondary | ICD-10-CM

## 2023-08-26 LAB — OPHTHALMOLOGY REPORT-SCANNED

## 2023-08-26 MED ORDER — METFORMIN HCL 1000 MG PO TABS
1000.0000 mg | ORAL_TABLET | Freq: Two times a day (BID) | ORAL | 1 refills | Status: DC
Start: 2023-08-26 — End: 2023-10-11

## 2023-08-26 NOTE — Addendum Note (Signed)
Addended by: Julious Payer D on: 08/26/2023 08:33 AM   Modules accepted: Orders

## 2023-08-26 NOTE — Telephone Encounter (Signed)
Dettinger pt NTBS 30-d given 07/21/23

## 2023-08-26 NOTE — Telephone Encounter (Signed)
Pt scheduled for next available on 10/13/2023 with [facility]. Can pt get refill until apt?

## 2023-09-09 ENCOUNTER — Other Ambulatory Visit: Payer: Self-pay | Admitting: Family Medicine

## 2023-09-09 DIAGNOSIS — E1159 Type 2 diabetes mellitus with other circulatory complications: Secondary | ICD-10-CM

## 2023-09-09 DIAGNOSIS — E1169 Type 2 diabetes mellitus with other specified complication: Secondary | ICD-10-CM

## 2023-09-09 DIAGNOSIS — E119 Type 2 diabetes mellitus without complications: Secondary | ICD-10-CM

## 2023-09-22 ENCOUNTER — Other Ambulatory Visit: Payer: Self-pay | Admitting: Family Medicine

## 2023-09-22 DIAGNOSIS — E1169 Type 2 diabetes mellitus with other specified complication: Secondary | ICD-10-CM

## 2023-09-22 DIAGNOSIS — E119 Type 2 diabetes mellitus without complications: Secondary | ICD-10-CM

## 2023-09-22 DIAGNOSIS — I152 Hypertension secondary to endocrine disorders: Secondary | ICD-10-CM

## 2023-10-13 ENCOUNTER — Encounter: Payer: Self-pay | Admitting: Family Medicine

## 2023-10-13 ENCOUNTER — Ambulatory Visit: Payer: Medicare PPO | Admitting: Family Medicine

## 2023-10-13 VITALS — BP 133/78 | HR 82 | Ht 68.0 in | Wt 162.0 lb

## 2023-10-13 DIAGNOSIS — I152 Hypertension secondary to endocrine disorders: Secondary | ICD-10-CM | POA: Diagnosis not present

## 2023-10-13 DIAGNOSIS — E1169 Type 2 diabetes mellitus with other specified complication: Secondary | ICD-10-CM

## 2023-10-13 DIAGNOSIS — E119 Type 2 diabetes mellitus without complications: Secondary | ICD-10-CM

## 2023-10-13 DIAGNOSIS — E785 Hyperlipidemia, unspecified: Secondary | ICD-10-CM

## 2023-10-13 DIAGNOSIS — E1159 Type 2 diabetes mellitus with other circulatory complications: Secondary | ICD-10-CM

## 2023-10-13 DIAGNOSIS — Z7984 Long term (current) use of oral hypoglycemic drugs: Secondary | ICD-10-CM

## 2023-10-13 DIAGNOSIS — E782 Mixed hyperlipidemia: Secondary | ICD-10-CM

## 2023-10-13 LAB — BAYER DCA HB A1C WAIVED: HB A1C (BAYER DCA - WAIVED): 6.1 % — ABNORMAL HIGH (ref 4.8–5.6)

## 2023-10-13 MED ORDER — METFORMIN HCL 1000 MG PO TABS: 1000.0000 mg | ORAL_TABLET | Freq: Two times a day (BID) | ORAL | 3 refills | Status: DC

## 2023-10-13 MED ORDER — PRAVASTATIN SODIUM 80 MG PO TABS: 80.0000 mg | ORAL_TABLET | Freq: Every day | ORAL | 3 refills | Status: DC

## 2023-10-13 MED ORDER — ACCU-CHEK SOFTCLIX LANCETS MISC: 3 refills | Status: AC

## 2023-10-13 MED ORDER — LISINOPRIL 20 MG PO TABS: 20.0000 mg | ORAL_TABLET | Freq: Every day | ORAL | 3 refills | Status: DC

## 2023-10-13 MED ORDER — GLIMEPIRIDE 4 MG PO TABS: 4.0000 mg | ORAL_TABLET | Freq: Every day | ORAL | 3 refills | Status: DC

## 2023-10-13 MED ORDER — OZEMPIC (1 MG/DOSE) 2 MG/1.5ML ~~LOC~~ SOPN: 1.0000 mg | PEN_INJECTOR | SUBCUTANEOUS | 3 refills | Status: AC

## 2023-10-13 NOTE — Progress Notes (Signed)
 BP 133/78   Pulse 82   Ht 5' 8 (1.727 m)   Wt 162 lb (73.5 kg)   SpO2 98%   BMI 24.63 kg/m    Subjective:   Patient ID: George [name], male    DOB: 1945/12/08, 78 y.o.   MRN: [id number]  HPI: George [name] is a 78 y.o. male presenting on 10/13/2023 for Medical Management of Chronic Issues and Diabetes   HPI Type 2 diabetes mellitus Patient comes in today for recheck of his diabetes. Patient has been currently taking metformin  and Ozempic  and glimepiride . Patient is currently on an ACE inhibitor/ARB. Patient has not seen an ophthalmologist this year. Patient denies any new issues with their feet. The symptom started onset as an adult hypertension and hyperlipidemia ARE RELATED TO DM   Hyperlipidemia Patient is coming in for recheck of his hyperlipidemia. The patient is currently taking fish oils and pravastatin . They deny any issues with myalgias or history of liver damage from it. They deny any focal numbness or weakness or chest pain.   Hypertension Patient is currently on lisinopril , and their blood pressure today is 133/78. Patient denies any lightheadedness or dizziness. Patient denies headaches, blurred vision, chest pains, shortness of breath, or weakness. Denies any side effects from medication and is content with current medication.   Relevant past medical, surgical, family and social history reviewed and updated as indicated. Interim medical history since our last visit reviewed. Allergies and medications reviewed and updated.  Review of Systems  Constitutional:  Negative for chills and fever.  Eyes:  Negative for visual disturbance.  Respiratory:  Negative for shortness of breath and wheezing.   Cardiovascular:  Negative for chest pain and leg swelling.  Musculoskeletal:  Negative for back pain and gait problem.  Skin:  Negative for rash.  Neurological:  Negative for dizziness and light-headedness.  All other systems reviewed and are negative.   Per HPI unless  specifically indicated above   Allergies as of 10/13/2023   No Known Allergies      Medication List        Accurate as of October 13, 2023  3:47 PM. If you have any questions, ask your nurse or doctor.          Accu-Chek Guide Me w/Device Kit USE TO TEST BLOOD SUGAR DAILY AS DIRECTED. DX E11.65   Accu-Chek Guide test strip Generic drug: glucose blood test blood sugar once daily. Dx: E11.65   Accu-Chek Softclix Lancets lancets test blood sugar once daily. Dx: E11.65   aspirin  81 MG tablet Take 81 mg by mouth daily.   calcium  carbonate 600 MG Tabs tablet Commonly known as: OS-CAL Take 600 mg by mouth daily with breakfast.   CINNAMON PO Take 1,000 mg by mouth daily.   fish oil-omega-3 fatty acids  1000 MG capsule Take 2 g by mouth daily.   glimepiride  4 MG tablet Commonly known as: AMARYL  Take 1 tablet (4 mg total) by mouth daily with breakfast. What changed: See the new instructions. Changed by: Fonda LABOR Zenobia Kuennen   lisinopril  20 MG tablet Commonly known as: ZESTRIL  Take 1 tablet (20 mg total) by mouth daily.   metFORMIN  1000 MG tablet Commonly known as: GLUCOPHAGE  Take 1 tablet (1,000 mg total) by mouth 2 (two) times daily with a meal.   Ozempic  (1 MG/DOSE) 2 MG/1.5ML Sopn Generic drug: Semaglutide  (1 MG/DOSE) Inject 1 mg into the skin once a week.   pravastatin  80 MG tablet Commonly known as: PRAVACHOL  Take  1 tablet (80 mg total) by mouth daily.   SYSTANE HYDRATION PF OP Apply to eye in the morning, at noon, and at bedtime.         Objective:   BP 133/78   Pulse 82   Ht 5' 8 (1.727 m)   Wt 162 lb (73.5 kg)   SpO2 98%   BMI 24.63 kg/m   Wt Readings from Last 3 Encounters:  10/13/23 162 lb (73.5 kg)  04/15/23 159 lb (72.1 kg)  01/14/23 160 lb (72.6 kg)    Physical Exam Vitals and nursing note reviewed.  Constitutional:      General: He is not in acute distress.    Appearance: He is well-developed. He is not diaphoretic.  Eyes:      General: No scleral icterus.    Conjunctiva/sclera: Conjunctivae normal.  Neck:     Thyroid : No thyromegaly.  Cardiovascular:     Rate and Rhythm: Normal rate and regular rhythm.     Heart sounds: Normal heart sounds. No murmur heard. Pulmonary:     Effort: Pulmonary effort is normal. No respiratory distress.     Breath sounds: Normal breath sounds. No wheezing.  Musculoskeletal:        General: No swelling. Normal range of motion.     Cervical back: Neck supple.  Lymphadenopathy:     Cervical: No cervical adenopathy.  Skin:    General: Skin is warm and dry.     Findings: No rash.  Neurological:     Mental Status: He is alert and oriented to person, place, and time.     Coordination: Coordination normal.  Psychiatric:        Behavior: Behavior normal.       Assessment & Plan:   Problem List Items Addressed This Visit       Cardiovascular and Mediastinum   Hypertension associated with diabetes (HCC)   Relevant Medications   glimepiride  (AMARYL ) 4 MG tablet   lisinopril  (ZESTRIL ) 20 MG tablet   metFORMIN  (GLUCOPHAGE ) 1000 MG tablet   pravastatin  (PRAVACHOL ) 80 MG tablet   Semaglutide , 1 MG/DOSE, (OZEMPIC , 1 MG/DOSE,) 2 MG/1.5ML SOPN   Other Relevant Orders   CBC with Differential/Platelet   CMP14+EGFR   Lipid panel   Bayer DCA Hb A1c Waived   PSA, total and free     Endocrine   Type 2 diabetes mellitus not at goal [facility])   Relevant Medications   glimepiride  (AMARYL ) 4 MG tablet   lisinopril  (ZESTRIL ) 20 MG tablet   metFORMIN  (GLUCOPHAGE ) 1000 MG tablet   pravastatin  (PRAVACHOL ) 80 MG tablet   Semaglutide , 1 MG/DOSE, (OZEMPIC , 1 MG/DOSE,) 2 MG/1.5ML SOPN   Mixed hyperlipidemia due to type 2 diabetes mellitus (HCC)   Relevant Medications   glimepiride  (AMARYL ) 4 MG tablet   lisinopril  (ZESTRIL ) 20 MG tablet   metFORMIN  (GLUCOPHAGE ) 1000 MG tablet   pravastatin  (PRAVACHOL ) 80 MG tablet   Semaglutide , 1 MG/DOSE, (OZEMPIC , 1 MG/DOSE,) 2 MG/1.5ML SOPN   Other  Relevant Orders   CBC with Differential/Platelet   CMP14+EGFR   Lipid panel   Bayer DCA Hb A1c Waived   PSA, total and free   Other Visit Diagnoses       Type 2 diabetes mellitus with hyperlipidemia (HCC)    -  Primary   Relevant Medications   Accu-Chek Softclix Lancets lancets   glimepiride  (AMARYL ) 4 MG tablet   lisinopril  (ZESTRIL ) 20 MG tablet   metFORMIN  (GLUCOPHAGE ) 1000 MG tablet   pravastatin  (PRAVACHOL ) 80  MG tablet   Semaglutide , 1 MG/DOSE, (OZEMPIC , 1 MG/DOSE,) 2 MG/1.5ML SOPN     Type 2 diabetes mellitus without complication, without long-term current use of insulin  (HCC)       Relevant Medications   glimepiride  (AMARYL ) 4 MG tablet   lisinopril  (ZESTRIL ) 20 MG tablet   metFORMIN  (GLUCOPHAGE ) 1000 MG tablet   pravastatin  (PRAVACHOL ) 80 MG tablet   Semaglutide , 1 MG/DOSE, (OZEMPIC , 1 MG/DOSE,) 2 MG/1.5ML SOPN       Patient's A1c was good at 6.1.  Continue current medicine, blood pressure and everything else looks good.  No changes  They will call and see when he is due for his eye doctor appointment.  The daughter says that her sister is still low but takes it to the eye doctor Follow up plan: Return in about 3 months (around 01/11/2024), or if symptoms worsen or fail to improve, for Diabetes hypertension hyperlipidemia.  Counseling provided for all of the vaccine components Orders Placed This Encounter  Procedures   CBC with Differential/Platelet   CMP14+EGFR   Lipid panel   Bayer DCA Hb A1c Waived   PSA, total and free    Fonda Levins, MD [facility] 10/13/2023, 3:47 PM

## 2023-10-13 NOTE — Progress Notes (Deleted)
 There were no vitals taken for this visit.   Subjective:   Patient ID: George [name], male    DOB: 1946/06/06, 78 y.o.   MRN: [id number]  HPI: George [name] is a 78 y.o. male presenting on 10/13/2023 for No chief complaint on file.  Type 2 diabetes mellitus Patient comes in today for recheck of his diabetes. Patient is currently taking metformin  and Ozempic . Fasting blood sugar ranges between *** at home. Patient denies any hypoglycemic episodes. Patient {HAS HAS [id number]} seen an ophthalmologist this year. Patient *** any new issues with their feet. His diabetes is complicated by hyperlipidemia and hypertension.   Hyperlipidemia Patient is currently taking ***. He denies myalgias or weakness. He does not have a history of liver damage from it.   Hypertension Patient is currently taking lisinopril . His blood pressure today is ***. Blood pressure at home averages around ***. He denies lightheadedness or dizziness, headaches, vision changes, chest pain, or shortness of breath.    Relevant past medical, surgical, family and social history reviewed and updated as indicated. Interim medical history since our last visit reviewed. Allergies and medications reviewed and updated.  Review of Systems  Per HPI unless specifically indicated above   Allergies as of 10/13/2023   No Known Allergies      Medication List        Accurate as of October 13, 2023  2:44 PM. If you have any questions, ask your nurse or doctor.          Accu-Chek Guide Me w/Device Kit USE TO TEST BLOOD SUGAR DAILY AS DIRECTED. DX E11.65   Accu-Chek Guide test strip Generic drug: glucose blood test blood sugar once daily. Dx: E11.65   Accu-Chek Softclix Lancets lancets test blood sugar once daily. Dx: E11.65   aspirin  81 MG tablet Take 81 mg by mouth daily.   calcium  carbonate 600 MG Tabs tablet Commonly known as: OS-CAL Take 600 mg by mouth daily with breakfast.   CINNAMON PO Take 1,000 mg by  mouth daily.   fish oil-omega-3 fatty acids  1000 MG capsule Take 2 g by mouth daily.   glimepiride  4 MG tablet Commonly known as: AMARYL  TAKE ONE TABLET DAILY WITH BREAKFAST   lisinopril  20 MG tablet Commonly known as: ZESTRIL  TAKE ONE TABLET ONCE DAILY   metFORMIN  1000 MG tablet Commonly known as: GLUCOPHAGE  Take 1 tablet (1,000 mg total) by mouth 2 (two) times daily with a meal.   Ozempic  (1 MG/DOSE) 2 MG/1.5ML Sopn Generic drug: Semaglutide  (1 MG/DOSE) Inject 1 mg into the skin once a week.   pravastatin  80 MG tablet Commonly known as: PRAVACHOL  TAKE ONE TABLET EVERY DAY   SYSTANE HYDRATION PF OP Apply to eye in the morning, at noon, and at bedtime.         Objective:   There were no vitals taken for this visit.  Wt Readings from Last 3 Encounters:  04/15/23 159 lb (72.1 kg)  01/14/23 160 lb (72.6 kg)  12/21/22 157 lb (71.2 kg)    Physical Exam  Assessment & Plan:   Problem List Items Addressed This Visit       Cardiovascular and Mediastinum   Hypertension associated with diabetes (HCC) - Primary     Endocrine   Type 2 diabetes mellitus not at goal [facility])   Mixed hyperlipidemia due to type 2 diabetes mellitus (HCC)   Other Visit Diagnoses       Type 2 diabetes mellitus with hyperlipidemia (HCC)  Type 2 diabetes mellitus without complication, without long-term current use of insulin  (HCC)            Follow up plan: No follow-ups on file.  Counseling provided for all of the vaccine components No orders of the defined types were placed in this encounter.   Vernell Music, Medical Student [facility] 10/13/2023, 2:44 PM

## 2023-10-14 LAB — CBC WITH DIFFERENTIAL/PLATELET
Basophils Absolute: 0.1 10*3/uL (ref 0.0–0.2)
Basos: 1 %
EOS (ABSOLUTE): 0.1 10*3/uL (ref 0.0–0.4)
Eos: 1 %
Hematocrit: 40.4 % (ref 37.5–51.0)
Hemoglobin: 13.5 g/dL (ref 13.0–17.7)
Immature Grans (Abs): 0 10*3/uL (ref 0.0–0.1)
Immature Granulocytes: 0 %
Lymphocytes Absolute: 2.3 10*3/uL (ref 0.7–3.1)
Lymphs: 28 %
MCH: 28.9 pg (ref 26.6–33.0)
MCHC: 33.4 g/dL (ref 31.5–35.7)
MCV: 87 fL (ref 79–97)
Monocytes Absolute: 0.5 10*3/uL (ref 0.1–0.9)
Monocytes: 6 %
Neutrophils Absolute: 5.3 10*3/uL (ref 1.4–7.0)
Neutrophils: 64 %
Platelets: 240 10*3/uL (ref 150–450)
RBC: 4.67 x10E6/uL (ref 4.14–5.80)
RDW: 12.6 % (ref 11.6–15.4)
WBC: 8.2 10*3/uL (ref 3.4–10.8)

## 2023-10-14 LAB — PSA, TOTAL AND FREE
PSA, Free Pct: 39 %
PSA, Free: 2.77 ng/mL
Prostate Specific Ag, Serum: 7.1 ng/mL — ABNORMAL HIGH (ref 0.0–4.0)

## 2023-10-14 LAB — LIPID PANEL
Chol/HDL Ratio: 3.3 {ratio} (ref 0.0–5.0)
Cholesterol, Total: 159 mg/dL (ref 100–199)
HDL: 48 mg/dL (ref >39)
LDL Chol Calc (NIH): 94 mg/dL (ref 0–99)
Triglycerides: 91 mg/dL (ref 0–149)
VLDL Cholesterol Cal: 17 mg/dL (ref 5–40)

## 2023-10-14 LAB — CMP14+EGFR
ALT: 20 [IU]/L (ref 0–44)
AST: 18 [IU]/L (ref 0–40)
Albumin: 4.3 g/dL (ref 3.8–4.8)
Alkaline Phosphatase: 69 [IU]/L (ref 44–121)
BUN/Creatinine Ratio: 16 (ref 10–24)
BUN: 17 mg/dL (ref 8–27)
Bilirubin Total: 0.2 mg/dL (ref 0.0–1.2)
CO2: 24 mmol/L (ref 20–29)
Calcium: 9.6 mg/dL (ref 8.6–10.2)
Chloride: 104 mmol/L (ref 96–106)
Creatinine, Ser: 1.07 mg/dL (ref 0.76–1.27)
Globulin, Total: 2.2 g/dL (ref 1.5–4.5)
Glucose: 100 mg/dL — ABNORMAL HIGH (ref 70–99)
Potassium: 4.8 mmol/L (ref 3.5–5.2)
Sodium: 142 mmol/L (ref 134–144)
Total Protein: 6.5 g/dL (ref 6.0–8.5)
eGFR: 71 mL/min/{1.73_m2} (ref >59)

## 2023-10-21 ENCOUNTER — Telehealth: Payer: Self-pay | Admitting: Family Medicine

## 2023-10-21 DIAGNOSIS — H40023 Open angle with borderline findings, high risk, bilateral: Secondary | ICD-10-CM | POA: Diagnosis not present

## 2023-10-21 DIAGNOSIS — H2512 Age-related nuclear cataract, left eye: Secondary | ICD-10-CM | POA: Diagnosis not present

## 2023-10-21 DIAGNOSIS — H524 Presbyopia: Secondary | ICD-10-CM | POA: Diagnosis not present

## 2023-10-21 NOTE — Telephone Encounter (Unsigned)
Copied from CRM [id number]. Topic: Clinical - Lab/Test Results >> Oct 21, 2023 10:52 AM Antony Haste wrote: Reason for CRM: This patient received a missed call pertaining to his lab results, provided results verbatim left by his PCP today on 01/17 as requested by his daughter Sherle Poe.

## 2023-10-24 ENCOUNTER — Other Ambulatory Visit: Payer: Self-pay | Admitting: Family Medicine

## 2023-10-24 DIAGNOSIS — E1169 Type 2 diabetes mellitus with other specified complication: Secondary | ICD-10-CM

## 2023-10-24 DIAGNOSIS — E119 Type 2 diabetes mellitus without complications: Secondary | ICD-10-CM

## 2023-10-28 ENCOUNTER — Encounter: Payer: Self-pay | Admitting: Family Medicine

## 2023-10-28 ENCOUNTER — Other Ambulatory Visit: Payer: Self-pay

## 2023-10-28 DIAGNOSIS — R972 Elevated prostate specific antigen [PSA]: Secondary | ICD-10-CM

## 2023-12-05 ENCOUNTER — Ambulatory Visit: Payer: Medicare PPO | Admitting: Urology

## 2023-12-22 ENCOUNTER — Telehealth: Payer: Self-pay

## 2023-12-22 ENCOUNTER — Ambulatory Visit (INDEPENDENT_AMBULATORY_CARE_PROVIDER_SITE_OTHER): Payer: Medicare PPO

## 2023-12-22 VITALS — BP 133/78 | Ht 68.0 in | Wt 162.0 lb

## 2023-12-22 DIAGNOSIS — E119 Type 2 diabetes mellitus without complications: Secondary | ICD-10-CM | POA: Diagnosis not present

## 2023-12-22 DIAGNOSIS — Z0001 Encounter for general adult medical examination with abnormal findings: Secondary | ICD-10-CM | POA: Diagnosis not present

## 2023-12-22 DIAGNOSIS — Z0000 Encounter for general adult medical examination without abnormal findings: Secondary | ICD-10-CM

## 2023-12-22 NOTE — Progress Notes (Signed)
 Pharmacy Medication Assistance Program Note    12/29/2023  Patient ID: George [name], male   DOB: 1946/09/09, 77 y.o.   MRN: [id number]     12/22/2023  Outreach Medication One  Manufacturer Medication One [employer] Ozempic  Dose of Ozempic 1MG   Type of Radiographer, therapeutic Assistance  Date Application Sent to Prescriber 12/22/2023  Date Application Submitted to Manufacturer 12/22/2023  Method Application Sent to Manufacturer Online  Patient Assistance Determination Approved  Approval Start Date 12/26/2023  Approval End Date 10/03/2024    Renewal approved - should arrive to office in 10-14 business days. Expect delays. Can reach out to company if voucher is needed [phone].

## 2023-12-22 NOTE — Patient Instructions (Addendum)
 George [name] , Thank you for taking time to come for your Medicare Wellness Visit. I appreciate your ongoing commitment to your health goals. Please review the following plan we discussed and let me know if I can assist you in the future.   Referrals/Orders/Follow-Ups/Clinician Recommendations: Please don't forget to go get your lab and schedule your Diabetic Eye Exam at the clinic.  This is a list of the screening recommended for you and due dates:  Health Maintenance  Topic Date Due   COVID-19 Vaccine (3 - 2024-25 season) 06/05/2023   Yearly kidney health urinalysis for diabetes  07/15/2023   Medicare Annual Wellness Visit  12/21/2023   Flu Shot  01/02/2024*   Hemoglobin A1C  04/11/2024   Complete foot exam   04/14/2024   Yearly kidney function blood test for diabetes  10/12/2024   Eye exam for diabetics  10/20/2024   DTaP/Tdap/Td vaccine (2 - Td or Tdap) 05/31/2025   Pneumonia Vaccine  Completed   Hepatitis C Screening  Completed   Zoster (Shingles) Vaccine  Completed   HPV Vaccine  Aged Out   Colon Cancer Screening  Discontinued  *Topic was postponed. The date shown is not the original due date.    Advanced directives: (Declined) Advance directive discussed with you today. Even though you declined this today, please call our office should you change your mind, and we can give you the proper paperwork for you to fill out.  Next Medicare Annual Wellness Visit scheduled for next year: Yes

## 2023-12-22 NOTE — Progress Notes (Signed)
 I have reviewed and agree with the above AWV documentation Arville Care, MD [facility] 12/22/2023, 12:37 PM
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
## 2023-12-22 NOTE — Telephone Encounter (Signed)
 Pt's wife stated that pt is out of his Ozempic? Wife said that pt suppose to pick it up in the clinic. Pls call and let pt know?  Pt also need a diabetic exam as well. Pls call pt

## 2023-12-26 ENCOUNTER — Other Ambulatory Visit

## 2023-12-26 ENCOUNTER — Other Ambulatory Visit: Payer: Self-pay | Admitting: Family Medicine

## 2023-12-26 DIAGNOSIS — E119 Type 2 diabetes mellitus without complications: Secondary | ICD-10-CM | POA: Diagnosis not present

## 2023-12-27 LAB — MICROALBUMIN / CREATININE URINE RATIO
Creatinine, Urine: 136.4 mg/dL
Microalb/Creat Ratio: 16 mg/g{creat} (ref 0–29)
Microalbumin, Urine: 21.9 ug/mL

## 2023-12-28 ENCOUNTER — Encounter: Payer: Self-pay | Admitting: Family Medicine
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
## 2024-01-03 ENCOUNTER — Encounter: Payer: Self-pay | Admitting: Urology

## 2024-01-03 ENCOUNTER — Ambulatory Visit (INDEPENDENT_AMBULATORY_CARE_PROVIDER_SITE_OTHER): Admitting: Urology

## 2024-01-03 VITALS — BP 134/67 | HR 69 | Temp 98.0°F

## 2024-01-03 DIAGNOSIS — R972 Elevated prostate specific antigen [PSA]: Secondary | ICD-10-CM

## 2024-01-03 LAB — URINALYSIS, ROUTINE W REFLEX MICROSCOPIC
Bilirubin, UA: NEGATIVE
Ketones, UA: NEGATIVE
Leukocytes,UA: NEGATIVE
Nitrite, UA: NEGATIVE
Protein,UA: NEGATIVE
RBC, UA: NEGATIVE
Specific Gravity, UA: 1.02 (ref 1.005–1.030)
Urobilinogen, Ur: 2 mg/dL — ABNORMAL HIGH (ref 0.2–1.0)
pH, UA: 6 (ref 5.0–7.5)

## 2024-01-03 LAB — BLADDER SCAN AMB NON-IMAGING: Scan Result: 55

## 2024-01-04 ENCOUNTER — Telehealth: Payer: Self-pay

## 2024-01-04 LAB — PSA, TOTAL AND FREE
PSA, Free Pct: 37.3 %
PSA, Free: 1.79 ng/mL
Prostate Specific Ag, Serum: 4.8 ng/mL — ABNORMAL HIGH (ref 0.0–4.0)

## 2024-01-04 NOTE — Telephone Encounter (Signed)
 Patients daughter returned call to office. Notified of [name] response. Daughter voiced understanding.

## 2024-01-04 NOTE — Telephone Encounter (Signed)
-----   Message from Donnita Falls sent at 01/04/2024  9:16 AM EDT ----- Please let pt know his PSA has come back down near previous range. Follow up in 3 months with PSA (as planned). Thanks.

## 2024-01-04 NOTE — Telephone Encounter (Signed)
 Copied from CRM [id number]. Topic: Clinical - Prescription Issue >> Jan 04, 2024  4:08 PM Ivette P wrote: Reason for CRM: Lupita Leash called in due to a missed call,   Advsied of note:  "Pts 1mg  Ozempic #4 arrived today. Left message informing pt to come by and pick up. Med put in lab fridge."   Lupita Leash will swing by 04/03  To pick up

## 2024-01-04 NOTE — Telephone Encounter (Signed)
 Pts 1mg  Ozempic #4 arrived today. Left message informing pt to come by and pick up. Med put in lab fridge.

## 2024-01-04 NOTE — Telephone Encounter (Signed)
 noted

## 2024-01-04 NOTE — Telephone Encounter (Signed)
 Patient called with no answer, left vm for return call.

## 2024-01-10 ENCOUNTER — Telehealth: Payer: Self-pay

## 2024-01-10 NOTE — Telephone Encounter (Signed)
 Letter mailed out making patient aware.

## 2024-01-10 NOTE — Telephone Encounter (Signed)
-----   Message from Donnita Falls sent at 01/04/2024  9:16 AM EDT ----- Please let pt know his PSA has come back down near previous range. Follow up in 3 months with PSA (as planned). Thanks.

## 2024-01-10 NOTE — Telephone Encounter (Signed)
 Made aware and voiced understanding

## 2024-01-10 NOTE — Progress Notes (Signed)
 Unable to reach patient by phone letter mailed along with [facility] message.

## 2024-01-11 ENCOUNTER — Telehealth: Payer: Self-pay | Admitting: Family Medicine

## 2024-01-11 NOTE — Telephone Encounter (Signed)
 Left message for patient to call back.  He is scheduled to be seen tomorrow for a 3 month check up but he is also scheduled to be seen on 5/1 for his annual visit. It looks like he should have enough medicine to last him until 5/1 if he wants to wait and be seen on that day and just do everything at that time. Wasn't sure if he had an acute issue that he needed to address for tomorrow or if he just wanted to be seen twice.

## 2024-01-12 ENCOUNTER — Ambulatory Visit: Payer: Medicare PPO | Admitting: Family Medicine

## 2024-01-12 ENCOUNTER — Encounter: Payer: Self-pay | Admitting: Family Medicine

## 2024-01-12 VITALS — BP 134/69 | HR 82 | Ht 68.0 in | Wt 167.0 lb

## 2024-01-12 DIAGNOSIS — E119 Type 2 diabetes mellitus without complications: Secondary | ICD-10-CM

## 2024-01-12 DIAGNOSIS — Z7984 Long term (current) use of oral hypoglycemic drugs: Secondary | ICD-10-CM | POA: Diagnosis not present

## 2024-01-12 DIAGNOSIS — E1159 Type 2 diabetes mellitus with other circulatory complications: Secondary | ICD-10-CM | POA: Diagnosis not present

## 2024-01-12 DIAGNOSIS — I152 Hypertension secondary to endocrine disorders: Secondary | ICD-10-CM

## 2024-01-12 DIAGNOSIS — E1169 Type 2 diabetes mellitus with other specified complication: Secondary | ICD-10-CM | POA: Diagnosis not present

## 2024-01-12 DIAGNOSIS — E785 Hyperlipidemia, unspecified: Secondary | ICD-10-CM

## 2024-01-12 DIAGNOSIS — E782 Mixed hyperlipidemia: Secondary | ICD-10-CM

## 2024-01-12 DIAGNOSIS — E11319 Type 2 diabetes mellitus with unspecified diabetic retinopathy without macular edema: Secondary | ICD-10-CM

## 2024-01-12 LAB — BAYER DCA HB A1C WAIVED: HB A1C (BAYER DCA - WAIVED): 6.8 % — ABNORMAL HIGH (ref 4.8–5.6)

## 2024-01-12 NOTE — Progress Notes (Signed)
 BP 134/69   Pulse 82   Ht 5\' 8"  (1.727 m)   Wt 167 lb (75.8 kg)   SpO2 96%   BMI 25.39 kg/m    Subjective:   Patient ID: George [name], male    DOB: [birth year], 78 y.o.   MRN: [id number]  HPI: George [name] is a 78 y.o. male presenting on 01/12/2024 for Medical Management of Chronic Issues and Diabetes   HPI Type 2 diabetes mellitus Patient comes in today for recheck of his diabetes. Patient has been currently taking glimepiride and metformin and Ozempic. Patient is currently on an ACE inhibitor/ARB. Patient has not seen an ophthalmologist this year. Patient denies any new issues with their feet. The symptom started onset as an adult retinopathy and hypertension and hyperlipidemia ARE RELATED TO DM   Hypertension Patient is currently on lisinopril, and their blood pressure today is 134/69. Patient denies any lightheadedness or dizziness. Patient denies headaches, blurred vision, chest pains, shortness of breath, or weakness. Denies any side effects from medication and is content with current medication.   Hyperlipidemia Patient is coming in for recheck of his hyperlipidemia. The patient is currently taking fish oils and pravastatin. They deny any issues with myalgias or history of liver damage from it. They deny any focal numbness or weakness or chest pain.   Relevant past medical, surgical, family and social history reviewed and updated as indicated. Interim medical history since our last visit reviewed. Allergies and medications reviewed and updated.  Review of Systems  Constitutional:  Negative for chills and fever.  Eyes:  Negative for visual disturbance.  Respiratory:  Negative for shortness of breath and wheezing.   Cardiovascular:  Negative for chest pain and leg swelling.  Musculoskeletal:  Negative for back pain and gait problem.  Skin:  Negative for rash.  Neurological:  Negative for dizziness, weakness and light-headedness.  All other systems reviewed and are  negative.   Per HPI unless specifically indicated above   Allergies as of 01/12/2024   No Known Allergies      Medication List        Accurate as of January 12, 2024  3:55 PM. If you have any questions, ask your nurse or doctor.          Accu-Chek Guide Me w/Device Kit USE TO TEST BLOOD SUGAR DAILY AS DIRECTED. DX E11.65   Accu-Chek Guide test strip Generic drug: glucose blood test blood sugar once daily. Dx: E11.65   Accu-Chek Softclix Lancets lancets test blood sugar once daily. Dx: E11.65   aspirin 81 MG tablet Take 81 mg by mouth daily.   calcium carbonate 600 MG Tabs tablet Commonly known as: OS-CAL Take 600 mg by mouth daily with breakfast.   CINNAMON PO Take 1,000 mg by mouth daily.   fish oil-omega-3 fatty acids 1000 MG capsule Take 2 g by mouth daily.   glimepiride 4 MG tablet Commonly known as: AMARYL Take 1 tablet (4 mg total) by mouth daily with breakfast.   lisinopril 20 MG tablet Commonly known as: ZESTRIL Take 1 tablet (20 mg total) by mouth daily.   metFORMIN 1000 MG tablet Commonly known as: GLUCOPHAGE Take 1 tablet (1,000 mg total) by mouth 2 (two) times daily with a meal.   Ozempic (1 MG/DOSE) 2 MG/1.5ML Sopn Generic drug: Semaglutide (1 MG/DOSE) Inject 1 mg into the skin once a week.   pravastatin 80 MG tablet Commonly known as: PRAVACHOL Take 1 tablet (80 mg total) by mouth daily.  SYSTANE HYDRATION PF OP Apply to eye in the morning, at noon, and at bedtime.         Objective:   BP 134/69   Pulse 82   Ht 5\' 8"  (1.727 m)   Wt 167 lb (75.8 kg)   SpO2 96%   BMI 25.39 kg/m   Wt Readings from Last 3 Encounters:  01/12/24 167 lb (75.8 kg)  12/22/23 162 lb (73.5 kg)  10/13/23 162 lb (73.5 kg)    Physical Exam Vitals and nursing note reviewed.  Constitutional:      General: He is not in acute distress.    Appearance: He is well-developed. He is not diaphoretic.  Eyes:     General: No scleral icterus.     Conjunctiva/sclera: Conjunctivae normal.  Neck:     Thyroid: No thyromegaly.  Cardiovascular:     Rate and Rhythm: Normal rate and regular rhythm.     Heart sounds: Normal heart sounds. No murmur heard. Pulmonary:     Effort: Pulmonary effort is normal. No respiratory distress.     Breath sounds: Normal breath sounds. No wheezing.  Musculoskeletal:        General: No swelling. Normal range of motion.     Cervical back: Neck supple.  Lymphadenopathy:     Cervical: No cervical adenopathy.  Skin:    General: Skin is warm and dry.     Findings: No rash.  Neurological:     Mental Status: He is alert and oriented to person, place, and time.     Coordination: Coordination normal.  Psychiatric:        Behavior: Behavior normal.       Assessment & Plan:   Problem List Items Addressed This Visit       Cardiovascular and Mediastinum   Hypertension associated with diabetes (HCC)     Endocrine   Type 2 diabetes mellitus not at goal [facility])   Mixed hyperlipidemia due to type 2 diabetes mellitus (HCC)   Diabetic retinopathy (HCC)   Other Visit Diagnoses       Type 2 diabetes mellitus with hyperlipidemia (HCC)    -  Primary   Relevant Orders   Bayer DCA Hb A1c Waived       A1c looks good at 6.8.  Blood pressure everything else looks good.  No changes Follow up plan: Return in about 3 months (around 04/12/2024), or if symptoms worsen or fail to improve, for Diabetes recheck.  Counseling provided for all of the vaccine components Orders Placed This Encounter  Procedures   Bayer DCA Hb A1c Waived    Arville Care, MD [facility] 01/12/2024, 3:55 PM

## 2024-02-02 ENCOUNTER — Encounter: Admitting: Family Medicine

## 2024-04-09 ENCOUNTER — Other Ambulatory Visit: Payer: Self-pay

## 2024-04-09 ENCOUNTER — Encounter: Payer: Self-pay | Admitting: Urology

## 2024-04-09 ENCOUNTER — Ambulatory Visit (INDEPENDENT_AMBULATORY_CARE_PROVIDER_SITE_OTHER): Admitting: Urology

## 2024-04-09 VITALS — BP 129/66 | HR 96

## 2024-04-09 DIAGNOSIS — R972 Elevated prostate specific antigen [PSA]: Secondary | ICD-10-CM

## 2024-04-09 LAB — URINALYSIS, ROUTINE W REFLEX MICROSCOPIC
Bilirubin, UA: NEGATIVE
Glucose, UA: NEGATIVE
Leukocytes,UA: NEGATIVE
Nitrite, UA: NEGATIVE
RBC, UA: NEGATIVE
Specific Gravity, UA: 1.03 (ref 1.005–1.030)
Urobilinogen, Ur: 1 mg/dL (ref 0.2–1.0)
pH, UA: 6 (ref 5.0–7.5)
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
## 2024-04-10 ENCOUNTER — Ambulatory Visit: Payer: Self-pay | Admitting: Urology

## 2024-04-10 LAB — SPECIMEN STATUS REPORT

## 2024-04-10 LAB — PSA: Prostate Specific Ag, Serum: 4.9 ng/mL — ABNORMAL HIGH (ref 0.0–4.0)

## 2024-04-11 NOTE — Telephone Encounter (Signed)
 Pt's daughter Arland was made aware of NP response and voiced understanding.

## 2024-04-11 NOTE — Telephone Encounter (Signed)
-----   Message from Lauraine JAYSON Oz sent at 04/10/2024  9:26 AM EDT ----- Please let pt know his PSA is stable compared to prior result. Can recheck in 6 months. ----- Message ----- From: Interface, [facility] Lab Results In Sent: 04/10/2024   5:36 AM EDT To: [name] C Larocco, FNP

## 2024-04-17 DIAGNOSIS — H40023 Open angle with borderline findings, high risk, bilateral: Secondary | ICD-10-CM | POA: Diagnosis not present

## 2024-04-17 DIAGNOSIS — H2512 Age-related nuclear cataract, left eye: Secondary | ICD-10-CM | POA: Diagnosis not present

## 2024-04-19 ENCOUNTER — Telehealth: Payer: Self-pay | Admitting: Pharmacist

## 2024-04-19 ENCOUNTER — Ambulatory Visit: Admitting: Family Medicine

## 2024-04-19 NOTE — Telephone Encounter (Signed)
   Patient enrolled in the Novo nordisk patient assistance program for Ozempic .  Confirmed with patient/wife that patient is still taking the following medications for her T2DM: Ozempic  1mg  weekly Metformin  1g twice daily Glimepiride  4mg   We can likely discontinue the glimepiride  at PCP f/u next week.  Patient instructed to pick up Ozempic  from our office (4 month supply).  He is tolerating all medications and his blood sugar remains at goal.  GFR 71.  Follow up with PCP next week  Mliss Tarry Griffin, PharmD, BCACP, CPP Clinical Pharmacist, [facility]

## 2024-04-26 ENCOUNTER — Ambulatory Visit: Admitting: Family Medicine

## 2024-04-26 ENCOUNTER — Encounter: Payer: Self-pay | Admitting: Family Medicine

## 2024-04-26 VITALS — BP 118/65 | HR 67 | Ht 68.0 in | Wt 164.0 lb

## 2024-04-26 DIAGNOSIS — E1159 Type 2 diabetes mellitus with other circulatory complications: Secondary | ICD-10-CM | POA: Diagnosis not present

## 2024-04-26 DIAGNOSIS — E782 Mixed hyperlipidemia: Secondary | ICD-10-CM

## 2024-04-26 DIAGNOSIS — E119 Type 2 diabetes mellitus without complications: Secondary | ICD-10-CM

## 2024-04-26 DIAGNOSIS — I152 Hypertension secondary to endocrine disorders: Secondary | ICD-10-CM

## 2024-04-26 DIAGNOSIS — Z0001 Encounter for general adult medical examination with abnormal findings: Secondary | ICD-10-CM | POA: Diagnosis not present

## 2024-04-26 DIAGNOSIS — E1169 Type 2 diabetes mellitus with other specified complication: Secondary | ICD-10-CM

## 2024-04-26 DIAGNOSIS — B351 Tinea unguium: Secondary | ICD-10-CM | POA: Diagnosis not present

## 2024-04-26 DIAGNOSIS — E11319 Type 2 diabetes mellitus with unspecified diabetic retinopathy without macular edema: Secondary | ICD-10-CM | POA: Diagnosis not present

## 2024-04-26 DIAGNOSIS — Z0000 Encounter for general adult medical examination without abnormal findings: Secondary | ICD-10-CM

## 2024-04-26 LAB — BAYER DCA HB A1C WAIVED: HB A1C (BAYER DCA - WAIVED): 6.3 % — ABNORMAL HIGH (ref 4.8–5.6)

## 2024-04-26 LAB — LIPID PANEL

## 2024-04-26 NOTE — Progress Notes (Signed)
 BP 118/65   Pulse 67   Ht 5' 8 (1.727 m)   Wt 164 lb (74.4 kg)   SpO2 99%   BMI 24.94 kg/m    Subjective:   Patient ID: George [name], male    DOB: [birth year], 78 y.o.   MRN: [id number]  HPI: George [name] is a 78 y.o. male presenting on 04/26/2024 for Medical Management of Chronic Issues (CPE) and Diabetes   HPI Physical exam Patient denies any chest pain, shortness of breath, headaches or vision issues, abdominal complaints, diarrhea, nausea, vomiting, or joint issues.   Hypertension Patient is currently on lisinopril , and their blood pressure today is 118/65. Patient denies any lightheadedness or dizziness. Patient denies headaches, blurred vision, chest pains, shortness of breath, or weakness. Denies any side effects from medication and is content with current medication.   Type 2 diabetes mellitus Patient comes in today for recheck of his diabetes. Patient has been currently taking glimepiride  and metformin  and Ozempic . Patient is currently on an ACE inhibitor/ARB. Patient has not seen an ophthalmologist this year. Patient denies any new issues with their feet. The symptom started onset as an adult hypertension and hyperlipidemia ARE RELATED TO DM   Hyperlipidemia Patient is coming in for recheck of his hyperlipidemia. The patient is currently taking pravastatin . They deny any issues with myalgias or history of liver damage from it. They deny any focal numbness or weakness or chest pain.   Relevant past medical, surgical, family and social history reviewed and updated as indicated. Interim medical history since our last visit reviewed. Allergies and medications reviewed and updated.  Review of Systems  Constitutional:  Negative for chills and fever.  Eyes:  Negative for visual disturbance.  Respiratory:  Negative for shortness of breath and wheezing.   Cardiovascular:  Negative for chest pain and leg swelling.  Musculoskeletal:  Negative for back pain and gait  problem.  Skin:  Negative for rash.  Neurological:  Negative for dizziness and light-headedness.  All other systems reviewed and are negative.   Per HPI unless specifically indicated above   Allergies as of 04/26/2024   No Known Allergies      Medication List        Accurate as of April 26, 2024  3:49 PM. If you have any questions, ask your nurse or doctor.          Accu-Chek Guide Me w/Device Kit USE TO TEST BLOOD SUGAR DAILY AS DIRECTED. DX E11.65   Accu-Chek Guide test strip Generic drug: glucose blood test blood sugar once daily. Dx: E11.65   Accu-Chek Softclix Lancets lancets test blood sugar once daily. Dx: E11.65   aspirin  81 MG tablet Take 81 mg by mouth daily.   calcium  carbonate 600 MG Tabs tablet Commonly known as: OS-CAL Take 600 mg by mouth daily with breakfast.   CINNAMON PO Take 1,000 mg by mouth daily.   fish oil-omega-3 fatty acids  1000 MG capsule Take 2 g by mouth daily.   glimepiride  4 MG tablet Commonly known as: AMARYL  Take 1 tablet (4 mg total) by mouth daily with breakfast.   lisinopril  20 MG tablet Commonly known as: ZESTRIL  Take 1 tablet (20 mg total) by mouth daily.   metFORMIN  1000 MG tablet Commonly known as: GLUCOPHAGE  Take 1 tablet (1,000 mg total) by mouth 2 (two) times daily with a meal.   Ozempic  (1 MG/DOSE) 2 MG/1.5ML Sopn Generic drug: Semaglutide  (1 MG/DOSE) Inject 1 mg into the skin once a week.  pravastatin  80 MG tablet Commonly known as: PRAVACHOL  Take 1 tablet (80 mg total) by mouth daily.   SYSTANE HYDRATION PF OP Apply to eye in the morning, at noon, and at bedtime.         Objective:   BP 118/65   Pulse 67   Ht 5' 8 (1.727 m)   Wt 164 lb (74.4 kg)   SpO2 99%   BMI 24.94 kg/m   Wt Readings from Last 3 Encounters:  04/26/24 164 lb (74.4 kg)  01/12/24 167 lb (75.8 kg)  12/22/23 162 lb (73.5 kg)    Physical Exam Vitals and nursing note reviewed.  Constitutional:      General: He is not  in acute distress.    Appearance: He is well-developed. He is not diaphoretic.  Eyes:     General: No scleral icterus.    Conjunctiva/sclera: Conjunctivae normal.  Neck:     Thyroid : No thyromegaly.  Cardiovascular:     Rate and Rhythm: Normal rate and regular rhythm.     Heart sounds: Normal heart sounds. No murmur heard. Pulmonary:     Effort: Pulmonary effort is normal. No respiratory distress.     Breath sounds: Normal breath sounds. No wheezing.  Musculoskeletal:        General: No swelling. Normal range of motion.     Cervical back: Neck supple.  Lymphadenopathy:     Cervical: No cervical adenopathy.  Skin:    General: Skin is warm and dry.     Findings: No rash.  Neurological:     Mental Status: He is alert and oriented to person, place, and time.     Coordination: Coordination normal.  Psychiatric:        Behavior: Behavior normal.       Assessment & Plan:   Problem List Items Addressed This Visit       Cardiovascular and Mediastinum   Hypertension associated with diabetes (HCC)     Endocrine   Type 2 diabetes mellitus not at goal [facility])   Mixed hyperlipidemia due to type 2 diabetes mellitus (HCC)   Relevant Orders   Bayer DCA Hb A1c Waived   CBC with Differential/Platelet   CMP14+EGFR   Lipid panel   Diabetic retinopathy (HCC)   Other Visit Diagnoses       Physical exam    -  Primary     Onychomycosis           Seems to be doing well, A1c is 6.3 so looks good.  Blood pressure and everything else looks good today.  No changes.  The only real issue that he has the thickened toenail is growing in towards other toes so recommended that he go to a podiatrist or somewhere else to get his nails trimmed. Follow up plan: Return in about 3 months (around 07/27/2024), or if symptoms worsen or fail to improve, for Diabetes recheck.  Counseling provided for all of the vaccine components Orders Placed This Encounter  Procedures   Bayer DCA Hb A1c Waived   CBC  with Differential/Platelet   CMP14+EGFR   Lipid panel    Fonda Levins, MD [facility] 04/26/2024, 3:49 PM

## 2024-04-27 LAB — CBC WITH DIFFERENTIAL/PLATELET
Basophils Absolute: 0 x10E3/uL (ref 0.0–0.2)
Basos: 1 %
EOS (ABSOLUTE): 0.1 x10E3/uL (ref 0.0–0.4)
Eos: 1 %
Hematocrit: 37.3 % — ABNORMAL LOW (ref 37.5–51.0)
Hemoglobin: 12.1 g/dL — ABNORMAL LOW (ref 13.0–17.7)
Immature Grans (Abs): 0 x10E3/uL (ref 0.0–0.1)
Immature Granulocytes: 0 %
Lymphocytes Absolute: 1.8 x10E3/uL (ref 0.7–3.1)
Lymphs: 28 %
MCH: 29.2 pg (ref 26.6–33.0)
MCHC: 32.4 g/dL (ref 31.5–35.7)
MCV: 90 fL (ref 79–97)
Monocytes Absolute: 0.4 x10E3/uL (ref 0.1–0.9)
Monocytes: 6 %
Neutrophils Absolute: 4.2 x10E3/uL (ref 1.4–7.0)
Neutrophils: 64 %
Platelets: 232 x10E3/uL (ref 150–450)
RBC: 4.15 x10E6/uL (ref 4.14–5.80)
RDW: 13.1 % (ref 11.6–15.4)
WBC: 6.5 x10E3/uL (ref 3.4–10.8)

## 2024-04-27 LAB — CMP14+EGFR
ALT: 18 IU/L (ref 0–44)
AST: 21 IU/L (ref 0–40)
Albumin: 4 g/dL (ref 3.8–4.8)
Alkaline Phosphatase: 54 IU/L (ref 44–121)
BUN/Creatinine Ratio: 22 (ref 10–24)
BUN: 25 mg/dL (ref 8–27)
Bilirubin Total: 0.3 mg/dL (ref 0.0–1.2)
CO2: 21 mmol/L (ref 20–29)
Calcium: 8.9 mg/dL (ref 8.6–10.2)
Chloride: 103 mmol/L (ref 96–106)
Creatinine, Ser: 1.16 mg/dL (ref 0.76–1.27)
Globulin, Total: 2.1 g/dL (ref 1.5–4.5)
Glucose: 119 mg/dL — AB (ref 70–99)
Potassium: 4.7 mmol/L (ref 3.5–5.2)
Sodium: 140 mmol/L (ref 134–144)
Total Protein: 6.1 g/dL (ref 6.0–8.5)
eGFR: 64 mL/min/1.73 (ref >59)

## 2024-04-27 LAB — LIPID PANEL
Cholesterol, Total: 136 mg/dL (ref 100–199)
HDL: 37 mg/dL — AB (ref >39)
LDL CALC COMMENT:: 3.7 ratio (ref 0.0–5.0)
LDL Chol Calc (NIH): 81 mg/dL (ref 0–99)
Triglycerides: 94 mg/dL (ref 0–149)
VLDL Cholesterol Cal: 18 mg/dL (ref 5–40)

## 2024-05-04 ENCOUNTER — Ambulatory Visit: Payer: Self-pay | Admitting: Family Medicine

## 2024-07-03 ENCOUNTER — Telehealth: Payer: Self-pay

## 2024-07-03 NOTE — Telephone Encounter (Signed)
 In process of completing Novo Nordisk refills for patients OZEMPIC  1MG  PENS medication.  Application emailed to [name] P. for signature.

## 2024-07-18 NOTE — Telephone Encounter (Signed)
 Faxed Ozempic  1mg  refills to [facility]ordisk.

## 2024-07-27 ENCOUNTER — Ambulatory Visit: Admitting: Family Medicine

## 2024-07-27 ENCOUNTER — Encounter: Payer: Self-pay | Admitting: Family Medicine

## 2024-07-27 VITALS — BP 125/72 | HR 105 | Ht 68.0 in | Wt 165.0 lb

## 2024-07-27 DIAGNOSIS — E11319 Type 2 diabetes mellitus with unspecified diabetic retinopathy without macular edema: Secondary | ICD-10-CM | POA: Diagnosis not present

## 2024-07-27 DIAGNOSIS — Z23 Encounter for immunization: Secondary | ICD-10-CM | POA: Diagnosis not present

## 2024-07-27 DIAGNOSIS — F0280 Dementia in other diseases classified elsewhere without behavioral disturbance: Secondary | ICD-10-CM

## 2024-07-27 DIAGNOSIS — G301 Alzheimer's disease with late onset: Secondary | ICD-10-CM

## 2024-07-27 DIAGNOSIS — E119 Type 2 diabetes mellitus without complications: Secondary | ICD-10-CM

## 2024-07-27 DIAGNOSIS — D649 Anemia, unspecified: Secondary | ICD-10-CM

## 2024-07-27 DIAGNOSIS — E1159 Type 2 diabetes mellitus with other circulatory complications: Secondary | ICD-10-CM | POA: Diagnosis not present

## 2024-07-27 DIAGNOSIS — E782 Mixed hyperlipidemia: Secondary | ICD-10-CM | POA: Diagnosis not present

## 2024-07-27 DIAGNOSIS — I152 Hypertension secondary to endocrine disorders: Secondary | ICD-10-CM

## 2024-07-27 DIAGNOSIS — E1169 Type 2 diabetes mellitus with other specified complication: Secondary | ICD-10-CM | POA: Diagnosis not present

## 2024-07-27 DIAGNOSIS — Z7985 Long-term (current) use of injectable non-insulin antidiabetic drugs: Secondary | ICD-10-CM

## 2024-07-27 DIAGNOSIS — Z7984 Long term (current) use of oral hypoglycemic drugs: Secondary | ICD-10-CM

## 2024-07-27 LAB — BAYER DCA HB A1C WAIVED: HB A1C (BAYER DCA - WAIVED): 6.2 % — ABNORMAL HIGH (ref 4.8–5.6)

## 2024-07-27 NOTE — Progress Notes (Signed)
 BP 125/72   Pulse (!) 105   Ht 5' 8 (1.727 m)   Wt 165 lb (74.8 kg)   SpO2 94%   BMI 25.09 kg/m    Subjective:   Patient ID: George [name], male    DOB: 25-Dec-1945, 78 y.o.   MRN: [id number]  HPI: George [name] is a 78 y.o. male presenting on 07/27/2024 for Medical Management of Chronic Issues, Diabetes, Dementia, and Insomnia   Discussed the use of AI scribe software for clinical note transcription with the patient, who gave verbal consent to proceed.  History of Present Illness   George [name] is a 78 year old male with diabetes and dementia who presents for a recheck of his blood sugar and concerns about worsening dementia.  Glycemic control - Diabetes managed with glimepiride , metformin , and Ozempic  - Present for recheck of blood glucose levels  Cognitive impairment - Worsening forgetfulness with fluctuating memory - Experiences 'good days and bad days' regarding memory - No agitation or mood disturbances - Remains cheerful and enjoys telling jokes  Sleep disturbance - Difficulty sleeping at night - Frequently gets up and sits in the living room or walks around at night - Daytime napping attributed as possible cause of nighttime sleep disruption  Wandering behavior - Incident of wandering away from home - Found [place] the [place] after being missing for about an hour          Relevant past medical, surgical, family and social history reviewed and updated as indicated. Interim medical history since our last visit reviewed. Allergies and medications reviewed and updated.  Review of Systems  Constitutional:  Negative for chills and fever.  Eyes:  Negative for visual disturbance.  Respiratory:  Negative for shortness of breath and wheezing.   Cardiovascular:  Negative for chest pain and leg swelling.  Skin:  Negative for rash.  Neurological:  Negative for dizziness and light-headedness.  Psychiatric/Behavioral:  Positive for confusion and  sleep disturbance. Negative for agitation, behavioral problems, dysphoric mood, self-injury and suicidal ideas. The patient is not nervous/anxious.   All other systems reviewed and are negative.   Per HPI unless specifically indicated above   Allergies as of 07/27/2024   No Known Allergies      Medication List        Accurate as of July 27, 2024  3:07 PM. If you have any questions, ask your nurse or doctor.          Accu-Chek Guide Me w/Device Kit USE TO TEST BLOOD SUGAR DAILY AS DIRECTED. DX E11.65   Accu-Chek Guide test strip Generic drug: glucose blood test blood sugar once daily. Dx: E11.65   Accu-Chek Softclix Lancets lancets test blood sugar once daily. Dx: E11.65   aspirin  81 MG tablet Take 81 mg by mouth daily.   calcium  carbonate 600 MG Tabs tablet Commonly known as: OS-CAL Take 600 mg by mouth daily with breakfast.   CINNAMON PO Take 1,000 mg by mouth daily.   fish oil-omega-3 fatty acids  1000 MG capsule Take 2 g by mouth daily.   glimepiride  4 MG tablet Commonly known as: AMARYL  Take 1 tablet (4 mg total) by mouth daily with breakfast.   lisinopril  20 MG tablet Commonly known as: ZESTRIL  Take 1 tablet (20 mg total) by mouth daily.   metFORMIN  1000 MG tablet Commonly known as: GLUCOPHAGE  Take 1 tablet (1,000 mg total) by mouth 2 (two) times daily with a meal.   Ozempic  (1 MG/DOSE)  2 MG/1.5ML Sopn Generic drug: Semaglutide  (1 MG/DOSE) Inject 1 mg into the skin once a week.   pravastatin  80 MG tablet Commonly known as: PRAVACHOL  Take 1 tablet (80 mg total) by mouth daily.   SYSTANE HYDRATION PF OP Apply to eye in the morning, at noon, and at bedtime.         Objective:   BP 125/72   Pulse (!) 105   Ht 5' 8 (1.727 m)   Wt 165 lb (74.8 kg)   SpO2 94%   BMI 25.09 kg/m   Wt Readings from Last 3 Encounters:  07/27/24 165 lb (74.8 kg)  04/26/24 164 lb (74.4 kg)  01/12/24 167 lb (75.8 kg)    Physical Exam Physical Exam    VITALS: BP- 125/72 NECK: Thyroid  normal. CHEST: Lungs clear to auscultation. CARDIOVASCULAR: Heart regular rate and rhythm.         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 Mood stable. Previous memory medication not tolerated. - Encouraged staying awake during the day to improve sleep. - Consider mild sleep aid if insomnia persists. - Discussed tracking devices to prevent wandering.  Type 2 diabetes mellitus Blood sugar levels well-controlled with current medication regimen. Patient's A1c 6.2 today, looks good.  Hypertension Blood pressure well-controlled with lisinopril .  Hyperlipidemia Cholesterol levels well-managed with pravastatin  and fish oil.  General Health Maintenance Received flu vaccination.          Follow up plan: Return in about 3 months (around 10/27/2024), or if symptoms worsen or fail to improve, for Diabetes recheck.  Counseling provided for all of the vaccine components Orders Placed This Encounter  Procedures   Bayer DCA Hb A1c Waived   Vitamin B12   CBC with Differential/Platelet    Fonda Levins, MD [facility] 07/27/2024, 3:07 PM

## 2024-07-28 LAB — CBC WITH DIFFERENTIAL/PLATELET
Basophils Absolute: 0.1 x10E3/uL (ref 0.0–0.2)
Basos: 1 %
EOS (ABSOLUTE): 0.1 x10E3/uL (ref 0.0–0.4)
Eos: 1 %
Hematocrit: 43.4 % (ref 37.5–51.0)
Hemoglobin: 13.8 g/dL (ref 13.0–17.7)
Immature Grans (Abs): 0 x10E3/uL (ref 0.0–0.1)
Immature Granulocytes: 0 %
Lymphocytes Absolute: 2 x10E3/uL (ref 0.7–3.1)
Lymphs: 26 %
MCH: 28.9 pg (ref 26.6–33.0)
MCHC: 31.8 g/dL (ref 31.5–35.7)
MCV: 91 fL (ref 79–97)
Monocytes Absolute: 0.4 x10E3/uL (ref 0.1–0.9)
Monocytes: 5 %
Neutrophils Absolute: 5.2 x10E3/uL (ref 1.4–7.0)
Neutrophils: 67 %
Platelets: 267 x10E3/uL (ref 150–450)
RBC: 4.77 x10E6/uL (ref 4.14–5.80)
RDW: 12.4 % (ref 11.6–15.4)
WBC: 7.8 x10E3/uL (ref 3.4–10.8)

## 2024-07-28 LAB — VITAMIN B12: Vitamin B-12: 150 pg/mL — ABNORMAL LOW (ref 232–1245)

## 2024-07-30 ENCOUNTER — Telehealth: Payer: Self-pay

## 2024-07-30 NOTE — Telephone Encounter (Signed)
 Received 3 boxes of Ozempic  on 07/30/24 and placed in lab frig. Called both numbers on file and unable to lm on vm. Mychart message sent as well. LS

## 2024-08-06 ENCOUNTER — Ambulatory Visit: Payer: Self-pay | Admitting: Family Medicine

## 2024-08-06 MED ORDER — VITAMIN B COMPLEX W/B-12 PO TABS: 1.0000 | ORAL_TABLET | Freq: Every day | ORAL | 3 refills | Status: AC

## 2024-08-09 ENCOUNTER — Emergency Department (HOSPITAL_COMMUNITY)

## 2024-08-09 ENCOUNTER — Observation Stay (HOSPITAL_COMMUNITY)
Admission: EM | Admit: 2024-08-09 | Discharge: 2024-08-11 | Disposition: A | Discharge status: Home / Self Care | Attending: Internal Medicine | Admitting: Internal Medicine

## 2024-08-09 DIAGNOSIS — D72829 Elevated white blood cell count, unspecified: Secondary | ICD-10-CM | POA: Insufficient documentation

## 2024-08-09 DIAGNOSIS — Z7984 Long term (current) use of oral hypoglycemic drugs: Secondary | ICD-10-CM | POA: Insufficient documentation

## 2024-08-09 DIAGNOSIS — Z1152 Encounter for screening for COVID-19: Secondary | ICD-10-CM | POA: Diagnosis not present

## 2024-08-09 DIAGNOSIS — F0390 Unspecified dementia without behavioral disturbance: Secondary | ICD-10-CM | POA: Insufficient documentation

## 2024-08-09 DIAGNOSIS — Z7985 Long-term (current) use of injectable non-insulin antidiabetic drugs: Secondary | ICD-10-CM | POA: Insufficient documentation

## 2024-08-09 DIAGNOSIS — E11649 Type 2 diabetes mellitus with hypoglycemia without coma: Secondary | ICD-10-CM | POA: Diagnosis not present

## 2024-08-09 DIAGNOSIS — E872 Acidosis, unspecified: Secondary | ICD-10-CM | POA: Diagnosis not present

## 2024-08-09 DIAGNOSIS — Z87891 Personal history of nicotine dependence: Secondary | ICD-10-CM | POA: Insufficient documentation

## 2024-08-09 DIAGNOSIS — M6281 Muscle weakness (generalized): Secondary | ICD-10-CM | POA: Insufficient documentation

## 2024-08-09 DIAGNOSIS — F0280 Dementia in other diseases classified elsewhere without behavioral disturbance: Secondary | ICD-10-CM | POA: Diagnosis not present

## 2024-08-09 DIAGNOSIS — Z79899 Other long term (current) drug therapy: Secondary | ICD-10-CM | POA: Diagnosis not present

## 2024-08-09 DIAGNOSIS — I959 Hypotension, unspecified: Secondary | ICD-10-CM | POA: Diagnosis not present

## 2024-08-09 DIAGNOSIS — G9389 Other specified disorders of brain: Secondary | ICD-10-CM | POA: Diagnosis not present

## 2024-08-09 DIAGNOSIS — Z7982 Long term (current) use of aspirin: Secondary | ICD-10-CM | POA: Insufficient documentation

## 2024-08-09 DIAGNOSIS — E860 Dehydration: Principal | ICD-10-CM

## 2024-08-09 DIAGNOSIS — R531 Weakness: Secondary | ICD-10-CM | POA: Diagnosis not present

## 2024-08-09 DIAGNOSIS — G309 Alzheimer's disease, unspecified: Secondary | ICD-10-CM | POA: Diagnosis not present

## 2024-08-09 DIAGNOSIS — E861 Hypovolemia: Secondary | ICD-10-CM | POA: Insufficient documentation

## 2024-08-09 DIAGNOSIS — N179 Acute kidney failure, unspecified: Secondary | ICD-10-CM | POA: Diagnosis not present

## 2024-08-09 DIAGNOSIS — E785 Hyperlipidemia, unspecified: Secondary | ICD-10-CM | POA: Insufficient documentation

## 2024-08-09 DIAGNOSIS — Z7901 Long term (current) use of anticoagulants: Secondary | ICD-10-CM | POA: Insufficient documentation

## 2024-08-09 DIAGNOSIS — I6523 Occlusion and stenosis of bilateral carotid arteries: Secondary | ICD-10-CM | POA: Diagnosis not present

## 2024-08-09 LAB — COMPREHENSIVE METABOLIC PANEL WITH GFR
ALT: 19 U/L (ref 0–44)
AST: 25 U/L (ref 15–41)
Albumin: 4.2 g/dL (ref 3.5–5.0)
Alkaline Phosphatase: 47 U/L (ref 38–126)
Anion gap: 16 — ABNORMAL HIGH (ref 5–15)
BUN: 30 mg/dL — ABNORMAL HIGH (ref 8–23)
CO2: 21 mmol/L — ABNORMAL LOW (ref 22–32)
Calcium: 9.1 mg/dL (ref 8.9–10.3)
Chloride: 103 mmol/L (ref 98–111)
Creatinine, Ser: 2.14 mg/dL — ABNORMAL HIGH (ref 0.61–1.24)
GFR, Estimated: 31 mL/min — ABNORMAL LOW (ref >60)
Glucose, Bld: 144 mg/dL — ABNORMAL HIGH (ref 70–99)
Potassium: 4.8 mmol/L (ref 3.5–5.1)
Sodium: 140 mmol/L (ref 135–145)
Total Bilirubin: 0.4 mg/dL (ref 0.0–1.2)
Total Protein: 6.4 g/dL — ABNORMAL LOW (ref 6.5–8.1)

## 2024-08-09 LAB — CBC WITH DIFFERENTIAL/PLATELET
Abs Immature Granulocytes: 0.05 K/uL (ref 0.00–0.07)
Basophils Absolute: 0 K/uL (ref 0.0–0.1)
Basophils Relative: 0 %
Eosinophils Absolute: 0 K/uL (ref 0.0–0.5)
Eosinophils Relative: 0 %
HCT: 38.9 % — ABNORMAL LOW (ref 39.0–52.0)
Hemoglobin: 12.6 g/dL — ABNORMAL LOW (ref 13.0–17.0)
Immature Granulocytes: 1 %
Lymphocytes Relative: 10 %
Lymphs Abs: 1.1 K/uL (ref 0.7–4.0)
MCH: 29.3 pg (ref 26.0–34.0)
MCHC: 32.4 g/dL (ref 30.0–36.0)
MCV: 90.5 fL (ref 80.0–100.0)
Monocytes Absolute: 0.6 K/uL (ref 0.1–1.0)
Monocytes Relative: 5 %
Neutro Abs: 9 K/uL — ABNORMAL HIGH (ref 1.7–7.7)
Neutrophils Relative %: 84 %
Platelets: 226 K/uL (ref 150–400)
RBC: 4.3 MIL/uL (ref 4.22–5.81)
RDW: 12.9 % (ref 11.5–15.5)
WBC: 10.8 K/uL — ABNORMAL HIGH (ref 4.0–10.5)
nRBC: 0 % (ref 0.0–0.2)

## 2024-08-09 LAB — RESP PANEL BY RT-PCR (RSV, FLU A&B, COVID)  RVPGX2
Influenza A by PCR: NEGATIVE
Influenza B by PCR: NEGATIVE
Resp Syncytial Virus by PCR: NEGATIVE
SARS Coronavirus 2 by RT PCR: NEGATIVE

## 2024-08-09 LAB — URINE DRUG SCREEN
Amphetamines: NEGATIVE
Barbiturates: NEGATIVE
Benzodiazepines: NEGATIVE
Cocaine: NEGATIVE
Fentanyl: NEGATIVE
Methadone Scn, Ur: NEGATIVE
Opiates: NEGATIVE
Tetrahydrocannabinol: NEGATIVE

## 2024-08-09 LAB — TROPONIN T, HIGH SENSITIVITY
Troponin T High Sensitivity: 45 ng/L — ABNORMAL HIGH (ref 0–19)
Troponin T High Sensitivity: 35 ng/L — ABNORMAL HIGH (ref 0–19)

## 2024-08-09 LAB — CK: Total CK: 320 U/L (ref 49–397)

## 2024-08-09 LAB — LACTIC ACID, PLASMA
Lactic Acid, Venous: 2.2 mmol/L (ref 0.5–1.9)
Lactic Acid, Venous: 1.5 mmol/L (ref 0.5–1.9)

## 2024-08-09 LAB — ETHANOL: Alcohol, Ethyl (B): <15 mg/dL (ref <15)

## 2024-08-09 LAB — GLUCOSE, CAPILLARY: Glucose-Capillary: 221 mg/dL — ABNORMAL HIGH (ref 70–99)

## 2024-08-09 MED ORDER — MELATONIN 3 MG PO TABS
6.0000 mg | ORAL_TABLET | ORAL | Status: AC
  Administered 2024-08-10: 6 mg via ORAL
  Filled 2024-08-09: qty 2

## 2024-08-09 MED ORDER — INSULIN ASPART 100 UNIT/ML IJ SOLN
0.0000 [IU] | Freq: Every day | INTRAMUSCULAR | Status: DC
  Administered 2024-08-09: 2 [IU] via SUBCUTANEOUS
  Filled 2024-08-09 (×2): qty 1

## 2024-08-09 MED ORDER — MAGNESIUM GLUCONATE 500 (27 MG) MG PO TABS
500.0000 mg | ORAL_TABLET | ORAL | Status: DC
  Filled 2024-08-09: qty 1

## 2024-08-09 MED ORDER — LACTATED RINGERS IV BOLUS
1000.0000 mL | Freq: Once | INTRAVENOUS | Status: AC
  Administered 2024-08-09: 1000 mL via INTRAVENOUS

## 2024-08-09 MED ORDER — LACTATED RINGERS IV SOLN: INTRAVENOUS | Status: DC

## 2024-08-09 MED ORDER — PROCHLORPERAZINE EDISYLATE 10 MG/2ML IJ SOLN: 5.0000 mg | Freq: Four times a day (QID) | INTRAMUSCULAR | Status: DC | PRN

## 2024-08-09 MED ORDER — ACETAMINOPHEN 325 MG PO TABS: 650.0000 mg | ORAL_TABLET | Freq: Four times a day (QID) | ORAL | Status: DC | PRN

## 2024-08-09 MED ORDER — ENOXAPARIN SODIUM 30 MG/0.3ML IJ SOSY
30.0000 mg | PREFILLED_SYRINGE | INTRAMUSCULAR | Status: DC
  Administered 2024-08-09: 30 mg via SUBCUTANEOUS
  Filled 2024-08-09: qty 0.3

## 2024-08-09 MED ORDER — MELATONIN 3 MG PO TABS: 6.0000 mg | ORAL_TABLET | Freq: Every evening | ORAL | Status: DC | PRN

## 2024-08-09 MED ORDER — ENOXAPARIN SODIUM 40 MG/0.4ML IJ SOSY: 40.0000 mg | PREFILLED_SYRINGE | INTRAMUSCULAR | Status: DC

## 2024-08-09 MED ORDER — INSULIN ASPART 100 UNIT/ML IJ SOLN
0.0000 [IU] | Freq: Three times a day (TID) | INTRAMUSCULAR | Status: DC
  Administered 2024-08-10 (×2): 3 [IU] via SUBCUTANEOUS
  Administered 2024-08-11: 1 [IU] via SUBCUTANEOUS
  Filled 2024-08-09 (×3): qty 1

## 2024-08-09 MED ORDER — PRAVASTATIN SODIUM 40 MG PO TABS
80.0000 mg | ORAL_TABLET | Freq: Every day | ORAL | Status: DC
  Administered 2024-08-10 – 2024-08-11 (×2): 80 mg via ORAL
  Filled 2024-08-09 (×2): qty 2

## 2024-08-09 MED ORDER — POLYETHYLENE GLYCOL 3350 17 G PO PACK: 17.0000 g | PACK | Freq: Every day | ORAL | Status: DC | PRN

## 2024-08-09 NOTE — ED Triage Notes (Signed)
 Pt comes in by EMS for hypotension. Lowest was 64/43 per EMS.   Pt was found waling around for about 5 hrs in the [place]. Pt has had   18G L AC     Hx of diabetes and diamentia

## 2024-08-09 NOTE — ED Provider Notes (Signed)
 [facility]PART[facility] AT [facility] Provider Note   CSN: [id number] Arrival date & time: 08/09/24  1818     Patient presents with: Hypotension   George [name] is a 78 y.o. male.   Patient is a 78 year old male who presents to the emergency department after [place] and being gone from his family for approximately 5 hours.  Patient does have a history of diabetes as well as dementia.  Family notes that he does walk a lot but notes that this is the first time he has wandered away from home for this long.  Patient has no active complaints at this time.  He denies any headaches, pain to neck or back, chest pain, shortness of breath, abdominal pain, nausea, vomiting, diarrhea.  He denies any associated falls.  He was noted to be hypotensive by EMS.        Prior to Admission medications   Medication Sig Start Date End Date Taking? Authorizing Provider  Accu-Chek Softclix Lancets lancets test blood sugar once daily. Dx: E11.65 10/13/23   Dettinger, Fonda LABOR, MD  aspirin  81 MG tablet Take 81 mg by mouth daily.    [provider]  B Complex Vitamins (VITAMIN B COMPLEX W/B-12) TABS Take 1 tablet by mouth daily at 12 noon. 08/06/24   Dettinger, Fonda LABOR, MD  Blood Glucose Monitoring Suppl (ACCU-CHEK GUIDE ME) w/Device KIT USE TO TEST BLOOD SUGAR DAILY AS DIRECTED. DX E11.65 06/01/21   Dettinger, Fonda LABOR, MD  calcium  carbonate (OS-CAL) 600 MG TABS Take 600 mg by mouth daily with breakfast.     [provider]  CINNAMON PO Take 1,000 mg by mouth daily.     [provider]  fish oil-omega-3 fatty acids  1000 MG capsule Take 2 g by mouth daily.    [provider]  glimepiride  (AMARYL ) 4 MG tablet Take 1 tablet (4 mg total) by mouth daily with breakfast. 10/13/23   Dettinger, Fonda LABOR, MD  glucose blood (ACCU-CHEK GUIDE) test strip test blood sugar once daily. Dx: E11.65 04/12/22   Dettinger, Fonda LABOR, MD  lisinopril  (ZESTRIL ) 20  MG tablet Take 1 tablet (20 mg total) by mouth daily. 10/13/23   Dettinger, Fonda LABOR, MD  metFORMIN  (GLUCOPHAGE ) 1000 MG tablet Take 1 tablet (1,000 mg total) by mouth 2 (two) times daily with a meal. 10/13/23   Dettinger, Fonda LABOR, MD  Polyethyl Glycol-Propyl Glycol (SYSTANE HYDRATION PF OP) Apply to eye in the morning, at noon, and at bedtime.    [provider]  pravastatin  (PRAVACHOL ) 80 MG tablet Take 1 tablet (80 mg total) by mouth daily. 10/13/23   Dettinger, Fonda LABOR, MD  Semaglutide , 1 MG/DOSE, (OZEMPIC , 1 MG/DOSE,) 2 MG/1.5ML SOPN Inject 1 mg into the skin once a week. 10/13/23   Dettinger, Fonda LABOR, MD    Allergies: Patient has no known allergies.    Review of Systems  Constitutional:  Positive for fatigue.  All other systems reviewed and are negative.   Updated Vital Signs BP (!) 114/52   Pulse 92   Temp 98.2 F (36.8 C) (Oral)   Resp 18   Ht 5' 8 (1.727 m)   SpO2 94%   BMI 25.09 kg/m   Physical Exam Vitals and nursing note reviewed.  Constitutional:      General: He is not in acute distress.    Appearance: Normal appearance. He is not ill-appearing.  HENT:     Head: Normocephalic and atraumatic.  Nose: Nose normal.     Mouth/Throat:     Mouth: Mucous membranes are moist.  Eyes:     Extraocular Movements: Extraocular movements intact.     Conjunctiva/sclera: Conjunctivae normal.     Pupils: Pupils are equal, round, and reactive to light.  Cardiovascular:     Rate and Rhythm: Normal rate and regular rhythm.     Pulses: Normal pulses.     Heart sounds: Normal heart sounds. No murmur heard.    No gallop.  Pulmonary:     Effort: Pulmonary effort is normal. No respiratory distress.     Breath sounds: Normal breath sounds. No stridor. No wheezing, rhonchi or rales.  Chest:     Chest wall: No tenderness.  Abdominal:     General: Abdomen is flat. Bowel sounds are normal. There is no distension.     Palpations: Abdomen is soft.     Tenderness: There is no  abdominal tenderness. There is no guarding.  Musculoskeletal:        General: No swelling, tenderness, deformity or signs of injury. Normal range of motion.     Cervical back: Normal range of motion and neck supple. No rigidity or tenderness.     Right lower leg: No edema.     Left lower leg: No edema.  Skin:    General: Skin is warm and dry.     Findings: No bruising or rash.     Comments: Superficial abrasion over the dorsal aspect of the right wrist, no erythema or areas of abrasions or lacerations  Neurological:     General: No focal deficit present.     Mental Status: He is alert and oriented to person, place, and time. Mental status is at baseline.     Cranial Nerves: No cranial nerve deficit.     Sensory: No sensory deficit.     Motor: No weakness.     Coordination: Coordination normal.     Gait: Gait normal.  Psychiatric:        Mood and Affect: Mood normal.        Behavior: Behavior normal.        Thought Content: Thought content normal.        Judgment: Judgment normal.     (all labs ordered are listed, but only abnormal results are displayed) Labs Reviewed  RESP PANEL BY RT-PCR (RSV, FLU A&B, COVID)  [id number]  CULTURE, BLOOD (ROUTINE X 2)  CULTURE, BLOOD (ROUTINE X 2)  COMPREHENSIVE METABOLIC PANEL WITH GFR  LACTIC ACID, PLASMA  LACTIC ACID, PLASMA  CBC WITH DIFFERENTIAL/PLATELET  URINALYSIS, ROUTINE W REFLEX MICROSCOPIC  URINE DRUG SCREEN  ETHANOL  CK  TROPONIN T, HIGH SENSITIVITY    EKG: None  Radiology: DG Chest Port 1 View Result Date: 08/09/2024 CLINICAL DATA:  Weakness, hypotension EXAM: PORTABLE CHEST 1 VIEW COMPARISON:  07/09/2017 FINDINGS: Single frontal view of the chest demonstrates an unremarkable cardiac silhouette. Lung volumes are diminished, with linear consolidation at the left lung base likely reflecting atelectasis. No acute airspace disease, effusion, or pneumothorax. No acute bony abnormalities. IMPRESSION: 1. Hypoventilatory changes.  No  acute airspace disease. Electronically Signed   By: Ozell Daring M.D.   On: 08/09/2024 18:51     Procedures   Medications Ordered in the ED  lactated ringers  bolus 1,000 mL (1,000 mLs Intravenous New Bag/Given 08/09/24 1848)  Medical Decision Making Amount and/or Complexity of Data Reviewed Labs: ordered. Radiology: ordered.  Risk Prescription drug management. Decision regarding hospitalization.   This patient presents to the ED for concern of altered mental status, hypotension, this involves an extensive number of treatment options, and is a complaint that carries with it a high risk of complications and morbidity.  The differential diagnosis includes CVA, TIA, dehydration, acute kidney injury, electrolyte derangement, ACS, sepsis, pneumonia, urinary tract infection   Co morbidities that complicate the patient evaluation  Dementia   Additional history obtained:  Additional history obtained from family External records from outside source obtained and reviewed including medical records   Lab Tests:  I Ordered, and personally interpreted labs.  The pertinent results include: Leukocytosis, anemia at baseline unremarkable electrolytes, elevated creatinine, normal liver function, normal CK, normal lactic acid, mild elevation of troponin but downtrending, negative respiratory panel, negative alcohol   Imaging Studies ordered:  I ordered imaging studies including CT scan head, chest x-ray I independently visualized and interpreted imaging which showed no acute intracranial process, no acute cardiopulmonary process I agree with the radiologist interpretation   Cardiac Monitoring: / EKG:  The patient was maintained on a cardiac monitor.  I personally viewed and interpreted the cardiac monitored which showed an underlying rhythm of: Normal sinus rhythm, right bundle branch block, no ST/T wave changes, no ischemic changes, no  STEMI   Consultations Obtained:  I requested consultation with the hospitalist,  and discussed lab and imaging findings as well as pertinent plan - they recommend: [place]   Problem List / ED Course / Critical interventions / Medication management  Patient is doing well at this time and does remain stable.  Discussed with patient and family that we will plan for admission to the hospital service given his dehydration and acute kidney injury.  CT scan of the head was unremarkable.  He has no concerning neurological deficits at this point do not suspect CVA or TIA.  He is mentating at his baseline at this point but does remain confused from his dementia.  Remaining blood work has otherwise been unremarkable.  Do not specked underlying infectious source at this time.  Have discussed patient case with Dr. Shona with the hospitalist service who has excepted for admission. I ordered medication including IV fluids for dehydration and acute kidney injury Reevaluation of the patient after these medicines showed that the patient improved I have reviewed the patients home medicines and have made adjustments as needed   Social Determinants of Health:  None   Test / Admission - Considered:  Admission     Final diagnoses:  None    ED Discharge Orders     None          Daralene Lonni JONETTA DEVONNA 08/09/24 2136    Franklyn Sid SAILOR, MD 08/09/24 2311

## 2024-08-09 NOTE — Progress Notes (Signed)
 PHARMACIST - PHYSICIAN COMMUNICATION  CONCERNING:  Enoxaparin  (Lovenox ) for DVT Prophylaxis    RECOMMENDATION: Patient was prescribed enoxaprin 40mg  q24 hours for VTE prophylaxis.   There were no vitals filed for this visit.  Body mass index is 25.09 kg/m.  Estimated Creatinine Clearance: 27.1 mL/min (A) (by C-G formula based on SCr of 2.14 mg/dL (H)).   Based on [facility] policy patient is candidate for enoxaparin  30mg  every 24 hours based on CrCl <78ml/min or Weight <45kg  DESCRIPTION: Pharmacy has adjusted enoxaparin  dose per [facility] policy.  Patient is now receiving enoxaparin  30 mg every 24 hours   Denette Hass Rodriguez-Guzman PharmD, BCPS 08/09/2024 9:57 PM
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
## 2024-08-10 ENCOUNTER — Encounter (HOSPITAL_COMMUNITY): Payer: Self-pay | Admitting: Internal Medicine

## 2024-08-10 ENCOUNTER — Other Ambulatory Visit: Payer: Self-pay

## 2024-08-10 DIAGNOSIS — N179 Acute kidney failure, unspecified: Secondary | ICD-10-CM | POA: Diagnosis not present

## 2024-08-10 LAB — CBC WITH DIFFERENTIAL/PLATELET
Abs Immature Granulocytes: 0.02 K/uL (ref 0.00–0.07)
Basophils Absolute: 0 K/uL (ref 0.0–0.1)
Basophils Relative: 1 %
Eosinophils Absolute: 0 K/uL (ref 0.0–0.5)
Eosinophils Relative: 1 %
HCT: 32.5 % — ABNORMAL LOW (ref 39.0–52.0)
Hemoglobin: 10.9 g/dL — ABNORMAL LOW (ref 13.0–17.0)
Immature Granulocytes: 0 %
Lymphocytes Relative: 28 %
Lymphs Abs: 2.4 K/uL (ref 0.7–4.0)
MCH: 29.7 pg (ref 26.0–34.0)
MCHC: 33.5 g/dL (ref 30.0–36.0)
MCV: 88.6 fL (ref 80.0–100.0)
Monocytes Absolute: 0.6 K/uL (ref 0.1–1.0)
Monocytes Relative: 7 %
Neutro Abs: 5.3 K/uL (ref 1.7–7.7)
Neutrophils Relative %: 63 %
Platelets: 187 K/uL (ref 150–400)
RBC: 3.67 MIL/uL — ABNORMAL LOW (ref 4.22–5.81)
RDW: 12.7 % (ref 11.5–15.5)
WBC: 8.3 K/uL (ref 4.0–10.5)
nRBC: 0 % (ref 0.0–0.2)

## 2024-08-10 LAB — GLUCOSE, CAPILLARY
Glucose-Capillary: 61 mg/dL — ABNORMAL LOW (ref 70–99)
Glucose-Capillary: 146 mg/dL — ABNORMAL HIGH (ref 70–99)
Glucose-Capillary: 220 mg/dL — ABNORMAL HIGH (ref 70–99)
Glucose-Capillary: 208 mg/dL — ABNORMAL HIGH (ref 70–99)
Glucose-Capillary: 159 mg/dL — ABNORMAL HIGH (ref 70–99)

## 2024-08-10 LAB — URINALYSIS, ROUTINE W REFLEX MICROSCOPIC
Bacteria, UA: NONE SEEN
Bilirubin Urine: NEGATIVE
Glucose, UA: 50 mg/dL — AB
Ketones, ur: 20 mg/dL — AB
Leukocytes,Ua: NEGATIVE
Nitrite: NEGATIVE
Protein, ur: 30 mg/dL — AB
Specific Gravity, Urine: 1.015 (ref 1.005–1.030)
pH: 5 (ref 5.0–8.0)

## 2024-08-10 LAB — BASIC METABOLIC PANEL WITH GFR
Anion gap: 12 (ref 5–15)
BUN: 27 mg/dL — ABNORMAL HIGH (ref 8–23)
CO2: 24 mmol/L (ref 22–32)
Calcium: 8.8 mg/dL — ABNORMAL LOW (ref 8.9–10.3)
Chloride: 104 mmol/L (ref 98–111)
Creatinine, Ser: 1.51 mg/dL — ABNORMAL HIGH (ref 0.61–1.24)
GFR, Estimated: 47 mL/min — ABNORMAL LOW (ref >60)
Glucose, Bld: 47 mg/dL — ABNORMAL LOW (ref 70–99)
Potassium: 4 mmol/L (ref 3.5–5.1)
Sodium: 140 mmol/L (ref 135–145)

## 2024-08-10 LAB — PHOSPHORUS: Phosphorus: 2.9 mg/dL (ref 2.5–4.6)

## 2024-08-10 LAB — MAGNESIUM: Magnesium: 1.6 mg/dL — ABNORMAL LOW (ref 1.7–2.4)

## 2024-08-10 MED ORDER — ZOLPIDEM TARTRATE 5 MG PO TABS
5.0000 mg | ORAL_TABLET | Freq: Every evening | ORAL | Status: DC | PRN
Start: 2024-08-10 — End: 2024-08-11
  Administered 2024-08-10: 5 mg via ORAL
  Filled 2024-08-10: qty 1

## 2024-08-10 MED ORDER — ENOXAPARIN SODIUM 40 MG/0.4ML IJ SOSY
40.0000 mg | PREFILLED_SYRINGE | INTRAMUSCULAR | Status: DC
  Administered 2024-08-10: 40 mg via SUBCUTANEOUS
  Filled 2024-08-10: qty 0.4

## 2024-08-10 MED ORDER — DEXTROSE 50 % IV SOLN
12.5000 g | INTRAVENOUS | Status: AC
  Administered 2024-08-10: 12.5 g via INTRAVENOUS
  Filled 2024-08-10: qty 50

## 2024-08-10 MED ORDER — DEXTROSE-SODIUM CHLORIDE 5-0.45 % IV SOLN: INTRAVENOUS | Status: DC

## 2024-08-10 MED ORDER — MAGNESIUM SULFATE 2 GM/50ML IV SOLN
2.0000 g | Freq: Once | INTRAVENOUS | Status: AC
  Administered 2024-08-10: 2 g via INTRAVENOUS
  Filled 2024-08-10: qty 50

## 2024-08-10 NOTE — Evaluation (Signed)
 Physical Therapy Evaluation Patient Details Name: George [name] MRN: [id number] DOB: 1946/05/03 Today's Date: 08/10/2024  History of Present Illness  George [name] is a 78 y.o. male with medical history significant for dementia, type 2 diabetes, hyperlipidemia, hypertension, diabetic retinopathy, who presents to the ER after being found wandering away from his home, next to the road.  The patient walked away from family around noon on 08/09/24.  Family called the [facility], and they went looking for the patient.  5 hours later, he was found by the [facility] on a side of a road.  EMS was activated.  Upon EMS assessment, the patient was severely hypotensive with SBP of 64 and DBP of 43.  He received IV fluid bolus.  He had no cardiopulmonary, GI, or urinary symptoms.     In the ER, creatinine was noted to be above baseline with concern for AKI.  Mild leukocytosis 10.8 with neutrophilia 9.0.  UA was negative for pyuria.  Chest x-ray is nonacute.     The patient received 2 L of IV fluid boluses in the ER to maintain MAP above 65 and for elevated lactic acid 2.2 with improvement.  EDP requesting admission for further management of AKI and hypotension.  Admitted by [facility], hospitalist service.   Clinical Impression  Patient functioning near baseline for functional mobility and gait and ambulated in room, hallways without loss of balance or need for an AD. Patient encouraged to ambulate with nursing staff/mobility tech for length of stay. Plan:  Patient discharged from physical therapy to care of nursing for ambulation daily as tolerated for length of stay.          If plan is discharge home, recommend the following: Help with stairs or ramp for entrance;Assist for transportation   Can travel by private vehicle        Equipment Recommendations None recommended by PT  Recommendations for Other Services       Functional Status Assessment Patient has had a recent decline in their functional status and/or  demonstrates limited ability to make significant improvements in function in a reasonable and predictable amount of time     Precautions / Restrictions Precautions Precautions: None Recall of Precautions/Restrictions: Intact Restrictions Weight Bearing Restrictions Per Provider Order: No      Mobility  Bed Mobility Overal bed mobility: Needs Assistance Bed Mobility: Supine to Sit, Sit to Supine     Supine to sit: Supervision, HOB elevated Sit to supine: Supervision   General bed mobility comments: required HOB partially raised during supine to sitting    Transfers Overall transfer level: Needs assistance Equipment used: None, 1 person hand held assist Transfers: Sit to/from Stand, Bed to chair/wheelchair/BSC Sit to Stand: Supervision   Step pivot transfers: Supervision       General transfer comment: required verbal and occasional tactile cueing for safety due to limited awareness of IV insertion in LUE    Ambulation/Gait Ambulation/Gait assistance: Supervision Gait Distance (Feet): 120 Feet Assistive device: None Gait Pattern/deviations: Decreased step length - right, Decreased step length - left, Decreased stride length Gait velocity: decreased     General Gait Details: slightly labored movement with good return for ambulating in room, hallways without loss of balance or need for an AD  Stairs            Wheelchair Mobility     Tilt Bed    Modified Rankin (Stroke Patients Only)       Balance Overall balance assessment: Mild deficits observed,  not formally tested                                           Pertinent Vitals/Pain Pain Assessment Pain Assessment: No/denies pain    Home Living Family/patient expects to be discharged to:: Private residence Living Arrangements: Spouse/significant other Available Help at Discharge: Family;Available 24 hours/day Type of Home: House Home Access: Stairs to enter Entrance  Stairs-Rails: Right;Left (to wide to reach both) Entrance Stairs-Number of Steps: 10   Home Layout: One level Home Equipment: Rolling Walker (2 wheels);Grab bars - tub/shower;Shower seat      Prior Function Prior Level of Function : Needs assist       Physical Assist : Mobility (physical);ADLs (physical) Mobility (physical): Bed mobility;Transfers;Gait;Stairs   Mobility Comments: household and short distanced community ambulation without AD, does not drive ADLs Comments: Assisted by family     Extremity/Trunk Assessment   Upper Extremity Assessment Upper Extremity Assessment: Overall WFL for tasks assessed    Lower Extremity Assessment Lower Extremity Assessment: Overall WFL for tasks assessed    Cervical / Trunk Assessment Cervical / Trunk Assessment: Normal  Communication   Communication Communication: Impaired Factors Affecting Communication: Difficulty expressing self;Other (comment) (Occasionally have difficulty repsonding to questions)    Cognition Arousal: Alert Behavior During Therapy: WFL for tasks assessed/performed   PT - Cognitive impairments: History of cognitive impairments                         Following commands: Intact       Cueing Cueing Techniques: Verbal cues, Tactile cues     General Comments      Exercises     Assessment/Plan    PT Assessment Patient does not need any further PT services  PT Problem List         PT Treatment Interventions      PT Goals (Current goals can be found in the Care Plan section)  Acute Rehab PT Goals Patient Stated Goal: return home with family to assist PT Goal Formulation: With patient/family Time For Goal Achievement: 08/10/24 Potential to Achieve Goals: Good    Frequency       Co-evaluation               AM-PAC PT 6 Clicks Mobility  Outcome Measure Help needed turning from your back to your side while in a flat bed without using bedrails?: None Help needed moving  from lying on your back to sitting on the side of a flat bed without using bedrails?: None Help needed moving to and from a bed to a chair (including a wheelchair)?: None Help needed standing up from a chair using your arms (e.g., wheelchair or bedside chair)?: None Help needed to walk in hospital room?: None Help needed climbing 3-5 steps with a railing? : A Little 6 Click Score: 23    End of Session   Activity Tolerance: Patient tolerated treatment well Patient left: in bed;with call bell/phone within reach;with family/visitor present Nurse Communication: Mobility status PT Visit Diagnosis: Unsteadiness on feet (R26.81);Other abnormalities of gait and mobility (R26.89);Muscle weakness (generalized) (M62.81)    Time: 9060-8998 PT Time Calculation (min) (ACUTE ONLY): 22 min   Charges:   PT Evaluation $PT Eval Moderate Complexity: 1 Mod PT Treatments $Gait Training: 8-22 mins PT General Charges $$ ACUTE PT VISIT: 1 Visit  1:46 PM, 08/10/24 Lynwood Music, MPT Physical Therapist with [facility] 336 [phone] office [phone] mobile phone

## 2024-08-10 NOTE — Progress Notes (Signed)
 Hypoglycemic Event  CBG: 61 at 0758   Treatment: D50 25 mL (12.5 gm) at 0813  Symptoms: None  Follow-up CBG: Upfz:9162 CBG Result:146   Possible Reasons for Event: Inadequate meal intake  Comments/MD notified: Dr. Dino notified via Secure Chat message: Hi there good morning, this patient had an episode of hypoglycemia. CBG to begin was 61 and I gave 12.5g of dextrose  IV and the recheck was 146 after dextrose . Thanks.   Thaituru, Porter ORN

## 2024-08-10 NOTE — Inpatient Diabetes Management (Signed)
 Inpatient Diabetes Program Recommendations  AACE/ADA: New Consensus Statement on Inpatient Glycemic Control (2015)  Target Ranges:  Prepandial:   less than 140 mg/dL      Peak postprandial:   less than 180 mg/dL (1-2 hours)      Critically ill patients:  140 - 180 mg/dL   Lab Results  Component Value Date   GLUCAP 146 (H) 08/10/2024   HGBA1C 6.2 (H) 07/27/2024    Diabetes history: DM2 Outpatient Diabetes medications:  Amaryl  4 mg daily Metformin  1 gm bid Ozempic  1 mg weekly Current orders for Inpatient glycemic control: Novolog  0-9 units tid, 0-5 units hs  Inpatient Diabetes Program Recommendations:   Please consider: -Decrease Novolog  correction to 0-6 units tid, 0-5 units hs  Patient's DM medications may need adjusted prior to D/C. Please consider for discharge: -D/C Amaryl  or decrease to 1 mg daily  Thank you, Carolee Channell E. Ellyce Lafevers, RN, MSN, CNS, CDCES  Diabetes Coordinator Inpatient Glycemic Control Team Team Pager [phone] (8am-5pm) 08/10/2024 10:59 AM

## 2024-08-10 NOTE — Care Management Obs Status (Signed)
 MEDICARE OBSERVATION STATUS NOTIFICATION   Patient Details  Name: George [name] MRN: [id number] Date of Birth: 05/13/1946   Medicare Observation Status Notification Given:  Yes    Duwaine LITTIE Ada 08/10/2024, 12:38 PM

## 2024-08-10 NOTE — Progress Notes (Signed)
  Transition of Care [facility]) Screening Note   Patient Details  Name: George [name] Date of Birth: March 12, 1946   Transition of Care [facility]) CM/SW Contact:    Hoy DELENA Bigness, LCSW Phone Number: 08/10/2024, 10:56 AM    Transition of Care Department [facility]) has reviewed patient and no TOC needs have been identified at this time. We will continue to monitor patient advancement through interdisciplinary progression rounds. If new patient transition needs arise, please place a TOC consult.    08/10/24 1056  TOC Brief Assessment  Insurance and Status Reviewed  Patient has primary care physician Yes  Home environment has been reviewed Home w/ spouse  Prior level of function: Independent  Prior/Current Home Services No current home services  Social Drivers of Health Review SDOH reviewed no interventions necessary  Readmission risk has been reviewed Yes  Transition of care needs no transition of care needs at this time

## 2024-08-10 NOTE — Plan of Care (Signed)
 Alert and oriented x1-2.  Intermittent confusion noted. Hypoglycemic episode noted this AM. Glucose levels improved after dose of dextrose .  Family at bedside.  Patient with concerns for discharge home, reoriented and reassured that he would be observed overnight and  then discharge possible for tomorrow 08/11/2024.   Problem: Education: Goal: Knowledge of General Education information will improve Description: Including pain rating scale, medication(s)/side effects and non-pharmacologic comfort measures 08/10/2024 1431 by Niel Porter ORN, RN Outcome: Progressing 08/10/2024 1430 by Niel Porter ORN, RN Outcome: Progressing   Problem: Health Behavior/Discharge Planning: Goal: Ability to manage health-related needs will improve 08/10/2024 1431 by Niel Porter ORN, RN Outcome: Progressing 08/10/2024 1430 by Niel Porter ORN, RN Outcome: Progressing   Problem: Clinical Measurements: Goal: Ability to maintain clinical measurements within normal limits will improve 08/10/2024 1431 by Niel Porter ORN, RN Outcome: Progressing 08/10/2024 1430 by Niel Porter ORN, RN Outcome: Progressing Goal: Will remain free from infection 08/10/2024 1431 by Niel Porter ORN, RN Outcome: Progressing 08/10/2024 1430 by Niel Porter ORN, RN Outcome: Progressing Goal: Diagnostic test results will improve Outcome: Progressing Goal: Respiratory complications will improve Outcome: Progressing   Problem: Activity: Goal: Risk for activity intolerance will decrease 08/10/2024 1431 by Niel Porter ORN, RN Outcome: Progressing 08/10/2024 1430 by Niel Porter ORN, RN Outcome: Progressing   Problem: Nutrition: Goal: Adequate nutrition will be maintained 08/10/2024 1431 by Niel Porter ORN, RN Outcome: Progressing 08/10/2024 1430 by Niel Porter ORN, RN Outcome: Progressing   Problem: Coping: Goal: Level of anxiety will decrease Outcome: Progressing

## 2024-08-10 NOTE — Plan of Care (Signed)
  Problem: Clinical Measurements: Goal: Ability to maintain clinical measurements within normal limits will improve Outcome: Progressing   Problem: Activity: Goal: Risk for activity intolerance will decrease Outcome: Progressing   Problem: Nutrition: Goal: Adequate nutrition will be maintained Outcome: Progressing   Problem: Safety: Goal: Ability to remain free from injury will improve Outcome: Progressing   Problem: Skin Integrity: Goal: Risk for impaired skin integrity will decrease Outcome: Progressing   Problem: Education: Goal: Ability to describe self-care measures that may prevent or decrease complications (Diabetes Survival Skills Education) will improve Outcome: Progressing   Problem: Fluid Volume: Goal: Ability to maintain a balanced intake and output will improve Outcome: Progressing   Problem: Metabolic: Goal: Ability to maintain appropriate glucose levels will improve Outcome: Progressing

## 2024-08-10 NOTE — Progress Notes (Signed)
 PROGRESS NOTE    George [name]  FMW:[id number] DOB: August 26, 1946 DOA: 08/09/2024 PCP: Dettinger, Fonda LABOR, MD   Brief Narrative:   78 y.o. male with medical history significant for dementia, type 2 diabetes, hyperlipidemia, hypertension, diabetic retinopathy, who presents to the ER after being found wandering away from his home, next to the road. He was admitted for the management of AKI, hypotension, dehydration as well as hypoglycemia.  He lives at home with his wife.  His dementia has been progressively getting worse over the past year.    Assessment & Plan:  Principal Problem:   AKI (acute kidney injury)    AKI, likely prerenal from poor oral intake, dehydration, POA: Improving now Baseline creatinine 1.1 with GFR greater than 60. Presented with creatinine of 2.14 GFR 31.  Creatinine today is 1.5 Monitor urine output Avoid nephrotoxic agents, dehydration, hypotension. Repeat BMP in the morning.   Mild high anion gap metabolic acidosis Serum bicarb 21, anion gap 16 Continue IV fluid hydration   Hypotension Elevated lactic acid suspect from hypotension, hypovolemia Lactic acid is improving 1.5 from 2.2. Continue IV fluid hydration Continue to maintain MAP greater than 65.   Type 2 diabetes with hypoglycemia Last hemoglobin A1c 7.7 on 07/09/2017 Hemoglobin A1c 6.2 on 07/27/2024. Fluids changed to "Ringer's"  lactate to D5 1/2 NS at 75 cc/h on the morning of 08/10/2024 as patient blood glucose level was in low 60s.   Hyperlipidemia Resumed home pravastatin .   Alzheimer's dementia, progressively getting worse over the past year Reorient as needed   Mild leukocytosis  Follow UA and urine culture Treat if indicated.  Disposition: Patient lives at home with his wife.  He has dementia which has been progressively getting worse over the past year.  DVT prophylaxis: enoxaparin  (LOVENOX ) injection 40 mg Start: 08/10/24 2200     Code Status: Limited: Do not attempt  resuscitation (DNR) -DNR-LIMITED -Do Not Intubate/DNI  Family Communication:  Son and daughters are at the bedside Status is: Observation The patient remains OBS appropriate and will d/c before 2 midnights.    Subjective:  Son and daughter are at the bedside and most of the history is taken from them.  Patient appears drowsy at this time.  As per patient's family, patient wandered off.  He lives at home with his wife.  His wife had a stroke and she could not get out of the house.  His family called the sheriff department and they found him almost 1 mile away from his home.  As per patient's family, his dementia has been getting worse over the past year.  This is his third episode of wandering off this year.  Examination:  General exam: Appears drowsy Respiratory system: Clear to auscultation. Respiratory effort normal. Cardiovascular system: S1 & S2 heard, RRR. No JVD, murmurs, rubs, gallops or clicks. No pedal edema. Gastrointestinal system: Abdomen is nondistended, soft and nontender. No organomegaly or masses felt. Normal bowel sounds heard. Central nervous system: Appears drowsy. No focal neurological deficits. Extremities: Symmetric 5 x 5 power.     Diet Orders (From admission, onward)     Start     Ordered   08/09/24 2202  Diet heart healthy/carb modified Room service appropriate? Yes; Fluid consistency: Thin  Diet effective now       Question Answer Comment  Diet-HS Snack? Nothing   Room service appropriate? Yes   Fluid consistency: Thin      08/09/24 2202  Objective: Vitals:   08/09/24 2045 08/09/24 2100 08/10/24 0211 08/10/24 0537  BP: 128/69 126/69 (!) 97/46 114/60  Pulse: 82  73 67  Resp: 18 18 18 18   Temp:  97.8 F (36.6 C) 98.6 F (37 C) 97.8 F (36.6 C)  TempSrc:  Oral Oral   SpO2: 96%  94% 96%  Weight:  75 kg    Height:  5' 7.99 (1.727 m)      Intake/Output Summary (Last 24 hours) at 08/10/2024 0913 Last data filed at 08/10/2024  0800 Gross per 24 hour  Intake 2470.01 ml  Output 250 ml  Net 2220.01 ml   Filed Weights   08/09/24 2100  Weight: 75 kg    Scheduled Meds:  enoxaparin  (LOVENOX ) injection  40 mg Subcutaneous Q24H   insulin  aspart  0-5 Units Subcutaneous QHS   insulin  aspart  0-9 Units Subcutaneous TID WC   magnesium gluconate  500 mg Oral STAT   pravastatin   80 mg Oral Daily   Continuous Infusions:  dextrose  5 % and 0.45 % NaCl      Nutritional status     Body mass index is 25.15 kg/m.  Data Reviewed:   CBC: Recent Labs  Lab 08/09/24 1841 08/10/24 0417  WBC 10.8* 8.3  NEUTROABS 9.0* 5.3  HGB 12.6* 10.9*  HCT 38.9* 32.5*  MCV 90.5 88.6  PLT 226 187   Basic Metabolic Panel: Recent Labs  Lab 08/09/24 1841 08/10/24 0417  NA 140 140  K 4.8 4.0  CL 103 104  CO2 21* 24  GLUCOSE 144* 47*  BUN 30* 27*  CREATININE 2.14* 1.51*  CALCIUM  9.1 8.8*  MG  --  1.6*  PHOS  --  2.9   GFR: Estimated Creatinine Clearance: 38.4 mL/min (A) (by C-G formula based on SCr of 1.51 mg/dL (H)). Liver Function Tests: Recent Labs  Lab 08/09/24 1841  AST 25  ALT 19  ALKPHOS 47  BILITOT 0.4  PROT 6.4*  ALBUMIN 4.2   No results for input(s): LIPASE, AMYLASE in the last 168 hours. No results for input(s): AMMONIA in the last 168 hours. Coagulation Profile: No results for input(s): INR, PROTIME in the last 168 hours. Cardiac Enzymes: Recent Labs  Lab 08/09/24 1841  CKTOTAL 320   BNP (last 3 results) No results for input(s): PROBNP in the last 8760 hours. HbA1C: No results for input(s): HGBA1C in the last 72 hours. CBG: Recent Labs  Lab 08/09/24 2217 08/10/24 0758 08/10/24 0837  GLUCAP 221* 61* 146*   Lipid Profile: No results for input(s): CHOL, HDL, LDLCALC, TRIG, CHOLHDL, LDLDIRECT in the last 72 hours. Thyroid  Function Tests: No results for input(s): TSH, T4TOTAL, FREET4, T3FREE, THYROIDAB in the last 72 hours. Anemia Panel: No results  for input(s): VITAMINB12, FOLATE, FERRITIN, TIBC, IRON, RETICCTPCT in the last 72 hours. Sepsis Labs: Recent Labs  Lab 08/09/24 1841 08/09/24 2035  LATICACIDVEN 2.2* 1.5    Recent Results (from the past 240 hours)  Culture, blood (routine x 2)     Status: None (Preliminary result)   Collection Time: 08/09/24  6:41 PM   Specimen: BLOOD  Result Value Ref Range Status   Specimen Description BLOOD LEFT ANTECUBITAL  Final   Special Requests   Final    BOTTLES DRAWN AEROBIC ONLY Blood Culture adequate volume   Culture   Final    NO GROWTH < 12 HOURS Performed at [facility], [address] [place], [place] [address]    Report Status PENDING  Incomplete  Culture, blood (routine x 2)     Status: None (Preliminary result)   Collection Time: 08/09/24  6:41 PM   Specimen: BLOOD  Result Value Ref Range Status   Specimen Description BLOOD RIGHT ANTECUBITAL  Final   Special Requests   Final    BOTTLES DRAWN AEROBIC ONLY Blood Culture adequate volume   Culture   Final    NO GROWTH < 12 HOURS Performed at [facility], [address] [place], [place] [address]    Report Status PENDING  Incomplete  Resp panel by RT-PCR (RSV, Flu A&B, Covid) Anterior Nasal Swab     Status: None   Collection Time: 08/09/24  7:14 PM   Specimen: Anterior Nasal Swab  Result Value Ref Range Status   SARS Coronavirus 2 by RT PCR NEGATIVE NEGATIVE Final    Comment: (NOTE) SARS-CoV-2 target nucleic acids are NOT DETECTED.  The SARS-CoV-2 RNA is generally detectable in upper respiratory specimens during the acute phase of infection. The lowest concentration of SARS-CoV-2 viral copies this assay can detect is 138 copies/mL. A negative result does not preclude SARS-Cov-2 infection and should not be used as the sole basis for treatment or other patient management decisions. A negative result may occur with  improper specimen collection/handling, submission of specimen other than nasopharyngeal swab,  presence of viral mutation(s) within the areas targeted by this assay, and inadequate number of viral copies(<138 copies/mL). A negative result must be combined with clinical observations, patient history, and epidemiological information. The expected result is Negative.  Fact Sheet for Patients:  bloggercourse.com  Fact Sheet for Healthcare Providers:  seriousbroker.it  This test is no t yet approved or cleared by the [place]  FDA and  has been authorized for detection and/or diagnosis of SARS-CoV-2 by FDA under an Emergency Use Authorization (EUA). This EUA will remain  in effect (meaning this test can be used) for the duration of the COVID-19 declaration under Section 564(b)(1) of the Act, 21 U.S.C.section 360bbb-3(b)(1), unless the authorization is terminated  or revoked sooner.       Influenza A by PCR NEGATIVE NEGATIVE Final   Influenza B by PCR NEGATIVE NEGATIVE Final    Comment: (NOTE) The Xpert Xpress SARS-CoV-2/FLU/RSV plus assay is intended as an aid in the diagnosis of influenza from Nasopharyngeal swab specimens and should not be used as a sole basis for treatment. Nasal washings and aspirates are unacceptable for Xpert Xpress SARS-CoV-2/FLU/RSV testing.  Fact Sheet for Patients: bloggercourse.com  Fact Sheet for Healthcare Providers: seriousbroker.it  This test is not yet approved or cleared by the [place]  FDA and has been authorized for detection and/or diagnosis of SARS-CoV-2 by FDA under an Emergency Use Authorization (EUA). This EUA will remain in effect (meaning this test can be used) for the duration of the COVID-19 declaration under Section 564(b)(1) of the Act, 21 U.S.C. section 360bbb-3(b)(1), unless the authorization is terminated or revoked.     Resp Syncytial Virus by PCR NEGATIVE NEGATIVE Final    Comment: (NOTE) Fact Sheet for  Patients: bloggercourse.com  Fact Sheet for Healthcare Providers: seriousbroker.it  This test is not yet approved or cleared by the [place]  FDA and has been authorized for detection and/or diagnosis of SARS-CoV-2 by FDA under an Emergency Use Authorization (EUA). This EUA will remain in effect (meaning this test can be used) for the duration of the COVID-19 declaration under Section 564(b)(1) of the Act, 21 U.S.C. section 360bbb-3(b)(1), unless the authorization is terminated or revoked.  Performed at  [facility], [address] [place], [place] [address]          Radiology Studies: CT Head Wo Contrast Result Date: 08/09/2024 CLINICAL DATA:  Hypotension. EXAM: CT HEAD WITHOUT CONTRAST TECHNIQUE: Contiguous axial images were obtained from the base of the skull through the vertex without intravenous contrast. RADIATION DOSE REDUCTION: This exam was performed according to the departmental dose-optimization program which includes automated exposure control, adjustment of the mA and/or kV according to patient size and/or use of iterative reconstruction technique. COMPARISON:  None Available. FINDINGS: Brain: There is generalized cerebral atrophy with widening of the extra-axial spaces and ventricular dilatation. There are areas of decreased attenuation within the white matter tracts of the supratentorial brain, consistent with microvascular disease changes. Vascular: Moderate to marked severity bilateral cavernous carotid artery calcification is noted. Skull: Normal. Negative for fracture or focal lesion. Sinuses/Orbits: No acute finding. Other: None. IMPRESSION: 1. Generalized cerebral atrophy and microvascular disease changes of the supratentorial brain. 2. No acute intracranial abnormality. Electronically Signed   By: Suzen Dials M.D.   On: 08/09/2024 19:29   DG Chest Port 1 View Result Date: 08/09/2024 CLINICAL DATA:  Weakness,  hypotension EXAM: PORTABLE CHEST 1 VIEW COMPARISON:  07/09/2017 FINDINGS: Single frontal view of the chest demonstrates an unremarkable cardiac silhouette. Lung volumes are diminished, with linear consolidation at the left lung base likely reflecting atelectasis. No acute airspace disease, effusion, or pneumothorax. No acute bony abnormalities. IMPRESSION: 1. Hypoventilatory changes.  No acute airspace disease. Electronically Signed   By: Ozell Daring M.D.   On: 08/09/2024 18:51           LOS: 0 days   Time spent= 36 mins    Deliliah Room, MD [facility]  If 7PM-7AM, please contact night-coverage  08/10/2024, 9:13 AM

## 2024-08-11 DIAGNOSIS — N179 Acute kidney failure, unspecified: Secondary | ICD-10-CM | POA: Diagnosis not present

## 2024-08-11 LAB — BASIC METABOLIC PANEL WITH GFR
Anion gap: 10 (ref 5–15)
BUN: 15 mg/dL (ref 8–23)
CO2: 25 mmol/L (ref 22–32)
Calcium: 8.7 mg/dL — ABNORMAL LOW (ref 8.9–10.3)
Chloride: 105 mmol/L (ref 98–111)
Creatinine, Ser: 1.1 mg/dL (ref 0.61–1.24)
GFR, Estimated: >60 mL/min (ref >60)
Glucose, Bld: 157 mg/dL — ABNORMAL HIGH (ref 70–99)
Potassium: 3.9 mmol/L (ref 3.5–5.1)
Sodium: 140 mmol/L (ref 135–145)

## 2024-08-11 LAB — GLUCOSE, CAPILLARY: Glucose-Capillary: 133 mg/dL — ABNORMAL HIGH (ref 70–99)

## 2024-08-11 NOTE — Progress Notes (Signed)
 Patient alert to self and talking .  Family at bedside.  IV removed and DC instructions reviewed with family.   Family helped patient dress and they walked to main entrance for discharge.

## 2024-08-11 NOTE — Discharge Summary (Signed)
 Physician Discharge Summary   Patient: George [name] MRN: [id number] DOB: 28-Dec-1945  Admit date:     08/09/2024  Discharge date: 08/11/24  Discharge Physician: Deliliah Room   PCP: Dettinger, Fonda LABOR, MD   Recommendations at discharge:   Follow-up outpatient with PCP in 1 week.  Follow-up outpatient with neurology in a month for management of dementia Check your blood pressure and heart rate every day Stay hydrated  Discharge Diagnoses: Principal Problem:   AKI (acute kidney injury)   Hospital Course:    78 y.o. male with medical history significant for dementia, type 2 diabetes, hyperlipidemia, hypertension, diabetic retinopathy, who presents to the ER after being found wandering away from his home, next to the road. He was admitted for the management of AKI, hypotension, dehydration as well as hypoglycemia.  He lives at home with his wife.  His dementia has been progressively getting worse over the past year.   AKI, likely prerenal from poor oral intake, dehydration, POA: Resolved Baseline creatinine 1.1 with GFR greater than 60. Presented with creatinine of 2.14 GFR 31.  Creatinine on the day of the discharge was within normal limits Monitor urine output Avoid nephrotoxic agents, dehydration or hypotension. Repeat BMP in 1 week to be done by PCP.   Mild high anion gap metabolic acidosis Serum bicarb 21, anion gap 16 on admission Resolved with intravenous fluids   Hypotension, resolved with intravenous fluids improved Elevated lactic acid suspect from hypotension, hypovolemia Lactic acid improved Received IV fluid hydration Stop lisinopril     Type 2 diabetes with hypoglycemia Last hemoglobin A1c 7.7 on 07/09/2017 Hemoglobin A1c 6.2 on 07/27/2024. Fluids changed to "Ringer's"  lactate to D5 1/2 NS at 75 cc/h on the morning of 08/10/2024 as patient blood glucose level was in low 60s. Glucose level within normal notes at the time of the discharge.  Discontinued  glimepiride .  Continue with metformin  with close monitoring of blood glucose levels at home   Hyperlipidemia Continue with pravastatin    Alzheimer's dementia, progressively getting worse over the past year Reorient as needed Case discussed at length with the patient's daughter, son-in-law as well as son at the bedside.  Strongly advised that he needs to follow-up outpatient with the neurology who can monitor the progression of dementia and prescribe medications if necessary.  Disposition: Home.  Patient lives at home with his wife.      Consultants: None Procedures performed: None Disposition: Home Diet recommendation:  Cardiac and Carb modified diet DISCHARGE MEDICATION: Allergies as of 08/11/2024   No Known Allergies      Medication List     STOP taking these medications    glimepiride  4 MG tablet Commonly known as: AMARYL    lisinopril  20 MG tablet Commonly known as: ZESTRIL        TAKE these medications    Accu-Chek Guide Me w/Device Kit USE TO TEST BLOOD SUGAR DAILY AS DIRECTED. DX E11.65   Accu-Chek Guide test strip Generic drug: glucose blood test blood sugar once daily. Dx: E11.65   Accu-Chek Softclix Lancets lancets test blood sugar once daily. Dx: E11.65   aspirin  81 MG tablet Take 81 mg by mouth daily.   calcium  carbonate 600 MG Tabs tablet Commonly known as: OS-CAL Take 600 mg by mouth daily with breakfast.   CINNAMON PO Take 1,000 mg by mouth daily.   fish oil-omega-3 fatty acids  1000 MG capsule Take 2 g by mouth daily.   metFORMIN  1000 MG tablet Commonly known as: GLUCOPHAGE  Take 1 tablet (1,000  mg total) by mouth 2 (two) times daily with a meal.   Ozempic  (1 MG/DOSE) 2 MG/1.5ML Sopn Generic drug: Semaglutide  (1 MG/DOSE) Inject 1 mg into the skin once a week.   pravastatin  80 MG tablet Commonly known as: PRAVACHOL  Take 1 tablet (80 mg total) by mouth daily.   SYSTANE HYDRATION PF OP Apply 1 drop to eye 3 (three) times daily as  needed (dry eye).   Vitamin B Complex w/B-12 Tabs Take 1 tablet by mouth daily at 12 noon.        Follow-up Information     Dettinger, Fonda LABOR, MD Follow up.   Specialties: Family Medicine, Cardiology Contact information: [address] [place] [address] [phone]         [facility] Schedule an appointment as soon as possible for a visit in 1 month(s).   Contact information: [address]  Jewell LABOR [place] [facility] [address] [phone]                Discharge Exam: Filed Weights   08/09/24 2100  Weight: 75 kg   General exam: Appears alert and awake but oriented to self only  Respiratory system: Clear to auscultation. Respiratory effort normal. Cardiovascular system: S1 & S2 heard, RRR. No JVD, murmurs, rubs, gallops or clicks. No pedal edema. Gastrointestinal system: Abdomen is nondistended, soft and nontender. No organomegaly or masses felt. Normal bowel sounds heard. Central nervous system: Appears alert and awake but oriented to self only. No focal neurological deficits. Extremities: Symmetric 5 x 5 power.  Condition at discharge: good  The results of significant diagnostics from this hospitalization (including imaging, microbiology, ancillary and laboratory) are listed below for reference.   Imaging Studies: CT Head Wo Contrast Result Date: 08/09/2024 CLINICAL DATA:  Hypotension. EXAM: CT HEAD WITHOUT CONTRAST TECHNIQUE: Contiguous axial images were obtained from the base of the skull through the vertex without intravenous contrast. RADIATION DOSE REDUCTION: This exam was performed according to the departmental dose-optimization program which includes automated exposure control, adjustment of the mA and/or kV according to patient size and/or use of iterative reconstruction technique. COMPARISON:  None Available. FINDINGS: Brain: There is generalized cerebral atrophy with widening of the extra-axial spaces and ventricular dilatation.  There are areas of decreased attenuation within the white matter tracts of the supratentorial brain, consistent with microvascular disease changes. Vascular: Moderate to marked severity bilateral cavernous carotid artery calcification is noted. Skull: Normal. Negative for fracture or focal lesion. Sinuses/Orbits: No acute finding. Other: None. IMPRESSION: 1. Generalized cerebral atrophy and microvascular disease changes of the supratentorial brain. 2. No acute intracranial abnormality. Electronically Signed   By: Suzen Dials M.D.   On: 08/09/2024 19:29   DG Chest Port 1 View Result Date: 08/09/2024 CLINICAL DATA:  Weakness, hypotension EXAM: PORTABLE CHEST 1 VIEW COMPARISON:  07/09/2017 FINDINGS: Single frontal view of the chest demonstrates an unremarkable cardiac silhouette. Lung volumes are diminished, with linear consolidation at the left lung base likely reflecting atelectasis. No acute airspace disease, effusion, or pneumothorax. No acute bony abnormalities. IMPRESSION: 1. Hypoventilatory changes.  No acute airspace disease. Electronically Signed   By: Ozell Daring M.D.   On: 08/09/2024 18:51    Microbiology: Results for orders placed or performed during the hospital encounter of 08/09/24  Culture, blood (routine x 2)     Status: None (Preliminary result)   Collection Time: 08/09/24  6:41 PM   Specimen: BLOOD  Result Value Ref Range Status   Specimen Description BLOOD LEFT  ANTECUBITAL  Final   Special Requests   Final    BOTTLES DRAWN AEROBIC ONLY Blood Culture adequate volume   Culture   Final    NO GROWTH 2 DAYS Performed at [facility], [address] [place], [place] [address]    Report Status PENDING  Incomplete  Culture, blood (routine x 2)     Status: None (Preliminary result)   Collection Time: 08/09/24  6:41 PM   Specimen: BLOOD  Result Value Ref Range Status   Specimen Description BLOOD RIGHT ANTECUBITAL  Final   Special Requests   Final    BOTTLES DRAWN AEROBIC  ONLY Blood Culture adequate volume   Culture   Final    NO GROWTH 2 DAYS Performed at [facility], [address], [place], [place] [address]    Report Status PENDING  Incomplete  Resp panel by RT-PCR (RSV, Flu A&B, Covid) Anterior Nasal Swab     Status: None   Collection Time: 08/09/24  7:14 PM   Specimen: Anterior Nasal Swab  Result Value Ref Range Status   SARS Coronavirus 2 by RT PCR NEGATIVE NEGATIVE Final    Comment: (NOTE) SARS-CoV-2 target nucleic acids are NOT DETECTED.  The SARS-CoV-2 RNA is generally detectable in upper respiratory specimens during the acute phase of infection. The lowest concentration of SARS-CoV-2 viral copies this assay can detect is 138 copies/mL. A negative result does not preclude SARS-Cov-2 infection and should not be used as the sole basis for treatment or other patient management decisions. A negative result may occur with  improper specimen collection/handling, submission of specimen other than nasopharyngeal swab, presence of viral mutation(s) within the areas targeted by this assay, and inadequate number of viral copies(<138 copies/mL). A negative result must be combined with clinical observations, patient history, and epidemiological information. The expected result is Negative.  Fact Sheet for Patients:  bloggercourse.com  Fact Sheet for Healthcare Providers:  seriousbroker.it  This test is no t yet approved or cleared by the [place]  FDA and  has been authorized for detection and/or diagnosis of SARS-CoV-2 by FDA under an Emergency Use Authorization (EUA). This EUA will remain  in effect (meaning this test can be used) for the duration of the COVID-19 declaration under Section 564(b)(1) of the Act, 21 U.S.C.section 360bbb-3(b)(1), unless the authorization is terminated  or revoked sooner.       Influenza A by PCR NEGATIVE NEGATIVE Final   Influenza B by PCR NEGATIVE NEGATIVE  Final    Comment: (NOTE) The Xpert Xpress SARS-CoV-2/FLU/RSV plus assay is intended as an aid in the diagnosis of influenza from Nasopharyngeal swab specimens and should not be used as a sole basis for treatment. Nasal washings and aspirates are unacceptable for Xpert Xpress SARS-CoV-2/FLU/RSV testing.  Fact Sheet for Patients: bloggercourse.com  Fact Sheet for Healthcare Providers: seriousbroker.it  This test is not yet approved or cleared by the [place]  FDA and has been authorized for detection and/or diagnosis of SARS-CoV-2 by FDA under an Emergency Use Authorization (EUA). This EUA will remain in effect (meaning this test can be used) for the duration of the COVID-19 declaration under Section 564(b)(1) of the Act, 21 U.S.C. section 360bbb-3(b)(1), unless the authorization is terminated or revoked.     Resp Syncytial Virus by PCR NEGATIVE NEGATIVE Final    Comment: (NOTE) Fact Sheet for Patients: bloggercourse.com  Fact Sheet for Healthcare Providers: seriousbroker.it  This test is not yet approved or cleared by the [place]  FDA and has been authorized  for detection and/or diagnosis of SARS-CoV-2 by FDA under an Emergency Use Authorization (EUA). This EUA will remain in effect (meaning this test can be used) for the duration of the COVID-19 declaration under Section 564(b)(1) of the Act, 21 U.S.C. section 360bbb-3(b)(1), unless the authorization is terminated or revoked.  Performed at [facility], [address] [place], [place] [address]     Labs: CBC: Recent Labs  Lab 08/09/24 1841 08/10/24 0417  WBC 10.8* 8.3  NEUTROABS 9.0* 5.3  HGB 12.6* 10.9*  HCT 38.9* 32.5*  MCV 90.5 88.6  PLT 226 187   Basic Metabolic Panel: Recent Labs  Lab 08/09/24 1841 08/10/24 0417 08/11/24 0505  NA 140 140 140  K 4.8 4.0 3.9  CL 103 104 105  CO2 21* 24 25   GLUCOSE 144* 47* 157*  BUN 30* 27* 15  CREATININE 2.14* 1.51* 1.10  CALCIUM  9.1 8.8* 8.7*  MG  --  1.6*  --   PHOS  --  2.9  --    Liver Function Tests: Recent Labs  Lab 08/09/24 1841  AST 25  ALT 19  ALKPHOS 47  BILITOT 0.4  PROT 6.4*  ALBUMIN 4.2   CBG: Recent Labs  Lab 08/10/24 0837 08/10/24 1151 08/10/24 1622 08/10/24 2116 08/11/24 0710  GLUCAP 146* 220* 208* 159* 133*    Discharge time spent: 38 minutes.  Signed: Deliliah Room, MD [facility] 08/11/2024

## 2024-08-14 LAB — CULTURE, BLOOD (ROUTINE X 2)
Culture: NO GROWTH
Culture: NO GROWTH
Special Requests: ADEQUATE
Special Requests: ADEQUATE

## 2024-08-16 ENCOUNTER — Encounter: Payer: Self-pay | Admitting: Family Medicine

## 2024-08-16 ENCOUNTER — Ambulatory Visit (INDEPENDENT_AMBULATORY_CARE_PROVIDER_SITE_OTHER): Admitting: Family Medicine

## 2024-08-16 VITALS — BP 146/68 | HR 70 | Ht 67.0 in | Wt 160.0 lb

## 2024-08-16 DIAGNOSIS — N179 Acute kidney failure, unspecified: Secondary | ICD-10-CM | POA: Diagnosis not present

## 2024-08-16 DIAGNOSIS — B351 Tinea unguium: Secondary | ICD-10-CM

## 2024-08-16 DIAGNOSIS — E860 Dehydration: Secondary | ICD-10-CM | POA: Diagnosis not present

## 2024-08-16 DIAGNOSIS — F0280 Dementia in other diseases classified elsewhere without behavioral disturbance: Secondary | ICD-10-CM | POA: Diagnosis not present

## 2024-08-16 DIAGNOSIS — G301 Alzheimer's disease with late onset: Secondary | ICD-10-CM | POA: Diagnosis not present

## 2024-08-16 DIAGNOSIS — Z7985 Long-term (current) use of injectable non-insulin antidiabetic drugs: Secondary | ICD-10-CM | POA: Diagnosis not present

## 2024-08-16 NOTE — Progress Notes (Signed)
 BP (!) 146/68   Pulse 70   Ht 5' 7 (1.702 m)   Wt 160 lb (72.6 kg)   SpO2 95%   BMI 25.06 kg/m    Subjective:   Patient ID: George [name], male    DOB: Feb 26, 1946, 78 y.o.   MRN: [id number]  HPI: George [name] is a 78 y.o. male presenting on 08/16/2024 for Hospitalization Follow-up (Hypotension, hypoglycemia, dehydration)   Discussed the use of AI scribe software for clinical note transcription with the patient, who gave verbal consent to proceed.  History of Present Illness   George [name] is a 78 year old male with dementia who presents for hospital follow-up after an episode of wandering and acute kidney injury.  Altered mental status and wandering - Dementia with progressive worsening of memory impairment - Second episode of wandering and getting lost, most recently on August 09, 2024 - Remains active and mobile despite cognitive decline - Since hospital discharge, confusion has decreased and mental status has stabilized  Acute kidney injury and dehydration - Hospital admission from November 6 to August 11, 2024, for acute kidney injury, hypotension, and dehydration - Episode occurred after wandering for approximately five hours on a warm day without food intake since morning - Very low blood pressure on hospital arrival  Glycemic abnormalities - Hypoglycemia noted on hospital arrival, followed by hyperglycemia - Glimepiride  discontinued during hospitalization - Currently taking metformin  and Ozempic           Relevant past medical, surgical, family and social history reviewed and updated as indicated. Interim medical history since our last visit reviewed. Allergies and medications reviewed and updated.  Review of Systems  Constitutional:  Negative for chills and fever.  Eyes:  Negative for visual disturbance.  Respiratory:  Negative for shortness of breath and wheezing.   Cardiovascular:  Negative for chest pain and leg swelling.  Musculoskeletal:   Negative for back pain and gait problem.  Skin:  Negative for rash.  Psychiatric/Behavioral:  Positive for confusion and decreased concentration.   All other systems reviewed and are negative.   Per HPI unless specifically indicated above   Allergies as of 08/16/2024   No Known Allergies      Medication List        Accurate as of August 16, 2024  2:40 PM. If you have any questions, ask your nurse or doctor.          Accu-Chek Guide Me w/Device Kit USE TO TEST BLOOD SUGAR DAILY AS DIRECTED. DX E11.65   Accu-Chek Guide test strip Generic drug: glucose blood test blood sugar once daily. Dx: E11.65   Accu-Chek Softclix Lancets lancets test blood sugar once daily. Dx: E11.65   aspirin  81 MG tablet Take 81 mg by mouth daily.   calcium  carbonate 600 MG Tabs tablet Commonly known as: OS-CAL Take 600 mg by mouth daily with breakfast.   CINNAMON PO Take 1,000 mg by mouth daily.   fish oil-omega-3 fatty acids  1000 MG capsule Take 2 g by mouth daily.   metFORMIN  1000 MG tablet Commonly known as: GLUCOPHAGE  Take 1 tablet (1,000 mg total) by mouth 2 (two) times daily with a meal.   Ozempic  (1 MG/DOSE) 2 MG/1.5ML Sopn Generic drug: Semaglutide  (1 MG/DOSE) Inject 1 mg into the skin once a week.   pravastatin  80 MG tablet Commonly known as: PRAVACHOL  Take 1 tablet (80 mg total) by mouth daily.   SYSTANE HYDRATION PF OP Apply 1 drop to eye 3 (three)  times daily as needed (dry eye).   Vitamin B Complex w/B-12 Tabs Take 1 tablet by mouth daily at 12 noon.         Objective:   BP (!) 146/68   Pulse 70   Ht 5' 7 (1.702 m)   Wt 160 lb (72.6 kg)   SpO2 95%   BMI 25.06 kg/m   Wt Readings from Last 3 Encounters:  08/16/24 160 lb (72.6 kg)  08/09/24 165 lb 5.5 oz (75 kg)  07/27/24 165 lb (74.8 kg)    Physical Exam Vitals and nursing note reviewed.  Constitutional:      Appearance: Normal appearance.  Neurological:     Mental Status: He is alert. He is  disoriented.     Motor: Motor function is intact.    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 Blood pressure 146/68 mmHg, acceptable. Lisinopril  discontinued due to hypotension. Dehydration likely contributed to confusion and wandering. - Ordered blood work to assess hydration status and blood glucose levels. - Monitor blood pressure at home weekly and record readings. - Avoid lisinopril  due to hypotension.  Alzheimer's disease with late onset and wandering behavior Progressive Alzheimer's with episodes of wandering and confusion. Recent wandering episode likely exacerbated by dehydration. Discussed potential need for facility placement if wandering persists. - Continue using AirTags in shoes for tracking. - Monitor wandering behavior and consider facility placement if safety concerns persist.  Type 2 diabetes mellitus Recent hypoglycemia during hospitalization. Glimepiride  discontinued. Currently on metformin  and Ozempic . A1c not performed due to recent hospitalization. - Ordered blood glucose test to assess current levels. - Continue metformin  and Ozempic . - Avoid A1c test until three months post-hospitalization.          Follow up plan: Return if symptoms worsen or fail to improve.  Counseling provided for all of the vaccine components Orders Placed This  Encounter  Procedures   CBC With Diff/Platelet   CMP14+EGFR   Ambulatory referral to Podiatry    Fonda Levins, MD [facility] 08/16/2024, 2:40 PM

## 2024-08-17 LAB — CMP14+EGFR
ALT: 30 IU/L (ref 0–44)
AST: 19 IU/L (ref 0–40)
Albumin: 4.3 g/dL (ref 3.8–4.8)
Alkaline Phosphatase: 60 IU/L (ref 47–123)
BUN/Creatinine Ratio: 14 (ref 10–24)
BUN: 14 mg/dL (ref 8–27)
Bilirubin Total: 0.3 mg/dL (ref 0.0–1.2)
CO2: 24 mmol/L (ref 20–29)
Calcium: 9.9 mg/dL (ref 8.6–10.2)
Chloride: 99 mmol/L (ref 96–106)
Creatinine, Ser: 1.03 mg/dL (ref 0.76–1.27)
Globulin, Total: 2.5 g/dL (ref 1.5–4.5)
Glucose: 135 mg/dL — ABNORMAL HIGH (ref 70–99)
Potassium: 4.4 mmol/L (ref 3.5–5.2)
Sodium: 138 mmol/L (ref 134–144)
Total Protein: 6.8 g/dL (ref 6.0–8.5)
eGFR: 74 mL/min/1.73 (ref >59)

## 2024-08-17 LAB — CBC WITH DIFF/PLATELET
Basophils Absolute: 0 x10E3/uL (ref 0.0–0.2)
Basos: 1 %
EOS (ABSOLUTE): 0.1 x10E3/uL (ref 0.0–0.4)
Eos: 1 %
Hematocrit: 42.4 % (ref 37.5–51.0)
Hemoglobin: 13.8 g/dL (ref 13.0–17.7)
Immature Grans (Abs): 0 x10E3/uL (ref 0.0–0.1)
Immature Granulocytes: 0 %
Lymphocytes Absolute: 1.8 x10E3/uL (ref 0.7–3.1)
Lymphs: 28 %
MCH: 29.2 pg (ref 26.6–33.0)
MCHC: 32.5 g/dL (ref 31.5–35.7)
MCV: 90 fL (ref 79–97)
Monocytes Absolute: 0.4 x10E3/uL (ref 0.1–0.9)
Monocytes: 6 %
Neutrophils Absolute: 4.1 x10E3/uL (ref 1.4–7.0)
Neutrophils: 64 %
Platelets: 258 x10E3/uL (ref 150–450)
RBC: 4.73 x10E6/uL (ref 4.14–5.80)
RDW: 12.4 % (ref 11.6–15.4)
WBC: 6.3 x10E3/uL (ref 3.4–10.8)

## 2024-08-23 ENCOUNTER — Ambulatory Visit: Payer: Self-pay | Admitting: Family Medicine

## 2024-08-23 NOTE — Telephone Encounter (Signed)
 Wife Elveria aware of results

## 2024-09-13 NOTE — Telephone Encounter (Signed)
 Attempted to call, no answer or voicemail.  3 boxes Ozempic  1mg  dose pens ready in fridge.   Will need to discuss 2026 Novo Nordisk changes.

## 2024-10-02 ENCOUNTER — Other Ambulatory Visit: Payer: Self-pay | Admitting: Family Medicine

## 2024-10-02 DIAGNOSIS — E119 Type 2 diabetes mellitus without complications: Secondary | ICD-10-CM

## 2024-10-16 ENCOUNTER — Other Ambulatory Visit: Payer: Self-pay | Admitting: Family Medicine

## 2024-10-16 DIAGNOSIS — E1169 Type 2 diabetes mellitus with other specified complication: Secondary | ICD-10-CM

## 2024-10-25 LAB — OPHTHALMOLOGY REPORT-SCANNED

## 2024-11-01 ENCOUNTER — Ambulatory Visit: Payer: Self-pay | Admitting: Family Medicine

## 2024-11-01 ENCOUNTER — Ambulatory Visit: Admitting: Family Medicine

## 2024-11-08 ENCOUNTER — Ambulatory Visit: Admitting: Family Medicine
